# Patient Record
Sex: Male | Born: 1940
Health system: Southern US, Community
[De-identification: ages and names within clinical notes are randomized; demographics above are authoritative.]

## PROBLEM LIST (undated history)

## (undated) DIAGNOSIS — I10 Essential (primary) hypertension: Secondary | ICD-10-CM

## (undated) DIAGNOSIS — N189 Chronic kidney disease, unspecified: Secondary | ICD-10-CM

## (undated) DIAGNOSIS — E785 Hyperlipidemia, unspecified: Secondary | ICD-10-CM

## (undated) DIAGNOSIS — I219 Acute myocardial infarction, unspecified: Secondary | ICD-10-CM

## (undated) DIAGNOSIS — G4733 Obstructive sleep apnea (adult) (pediatric): Secondary | ICD-10-CM

## (undated) DIAGNOSIS — I2510 Atherosclerotic heart disease of native coronary artery without angina pectoris: Secondary | ICD-10-CM

## (undated) DIAGNOSIS — K219 Gastro-esophageal reflux disease without esophagitis: Secondary | ICD-10-CM

## (undated) DIAGNOSIS — H9190 Unspecified hearing loss, unspecified ear: Secondary | ICD-10-CM

## (undated) DIAGNOSIS — M1990 Unspecified osteoarthritis, unspecified site: Secondary | ICD-10-CM

## (undated) DIAGNOSIS — Z972 Presence of dental prosthetic device (complete) (partial): Secondary | ICD-10-CM

## (undated) DIAGNOSIS — E119 Type 2 diabetes mellitus without complications: Secondary | ICD-10-CM

## (undated) HISTORY — DX: Essential (primary) hypertension: I10

## (undated) HISTORY — DX: Acute myocardial infarction, unspecified: I21.9

## (undated) HISTORY — DX: Obstructive sleep apnea (adult) (pediatric): G47.33

## (undated) HISTORY — DX: Hyperlipidemia, unspecified: E78.5

## (undated) HISTORY — DX: Atherosclerotic heart disease of native coronary artery without angina pectoris: I25.10

---

## 1985-10-04 HISTORY — PX: EYE SURGERY: SHX253

## 2005-10-04 DIAGNOSIS — I219 Acute myocardial infarction, unspecified: Secondary | ICD-10-CM

## 2005-10-04 HISTORY — DX: Acute myocardial infarction, unspecified: I21.9

## 2006-04-27 ENCOUNTER — Inpatient Hospital Stay (HOSPITAL_COMMUNITY): Admission: AD | Admit: 2006-04-27 | Discharge: 2006-05-05 | Payer: Self-pay | Admitting: Cardiovascular Disease

## 2006-04-27 HISTORY — PX: CARDIAC CATHETERIZATION: SHX172

## 2006-04-28 ENCOUNTER — Encounter: Payer: Self-pay | Admitting: Vascular Surgery

## 2006-04-29 HISTORY — PX: TEE WITHOUT CARDIOVERSION: SHX5443

## 2006-04-29 HISTORY — PX: CORONARY ARTERY BYPASS GRAFT: SHX141

## 2006-05-19 ENCOUNTER — Emergency Department (HOSPITAL_COMMUNITY): Admission: EM | Admit: 2006-05-19 | Discharge: 2006-05-20 | Payer: Self-pay | Admitting: Emergency Medicine

## 2006-05-30 ENCOUNTER — Encounter
Admission: RE | Admit: 2006-05-30 | Discharge: 2006-05-30 | Payer: Self-pay | Admitting: Thoracic Surgery (Cardiothoracic Vascular Surgery)

## 2007-03-25 IMAGING — CR DG CHEST 2V
2 series · 2 of 2 positions shown · non-contrast
Comparison: 05/03/2006.

CLINICAL DATA: Status post CABG two weeks ago, now with right-sided chest pain on inspiration.      
 CHEST - 2 VIEW:

[w chest pa]
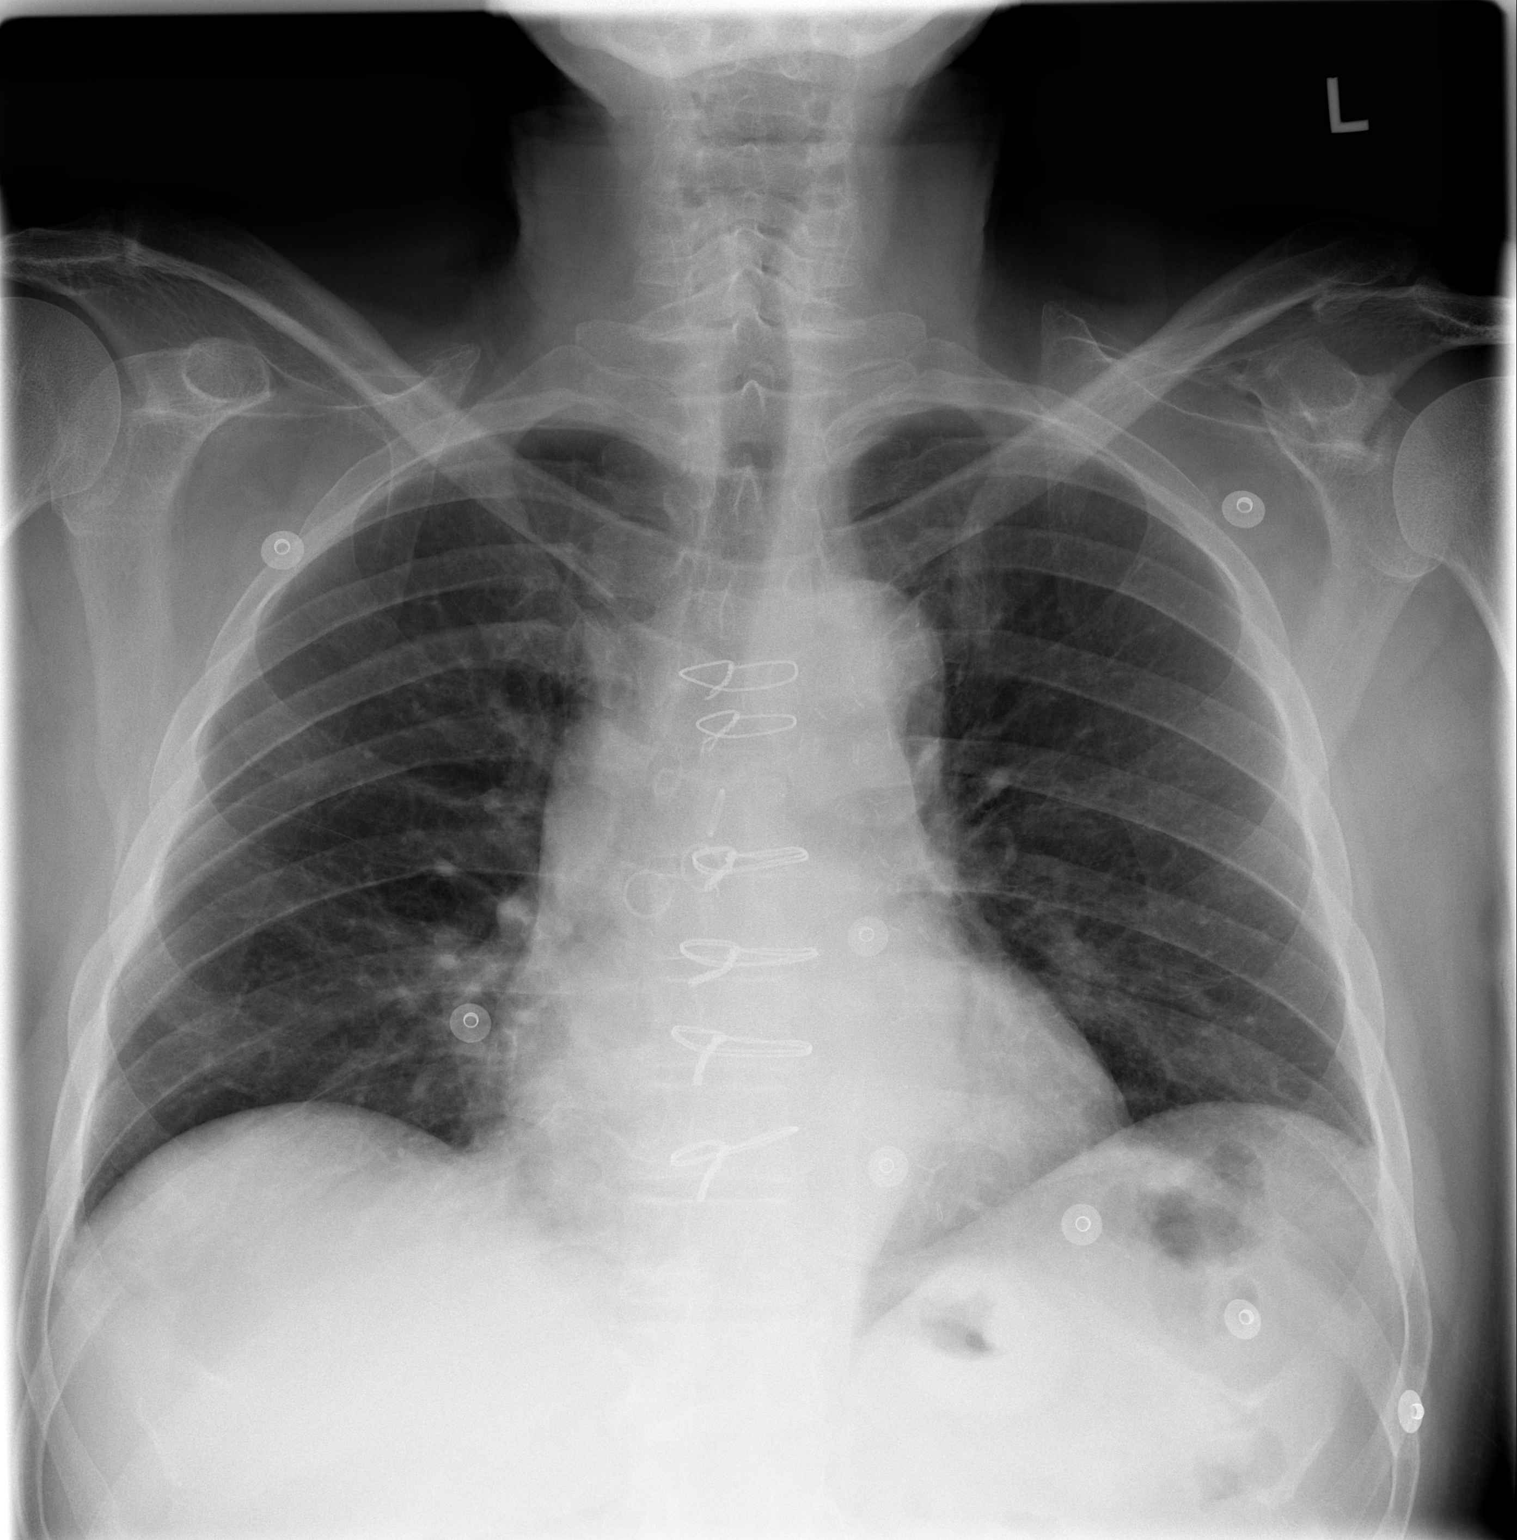

[w chest lat]
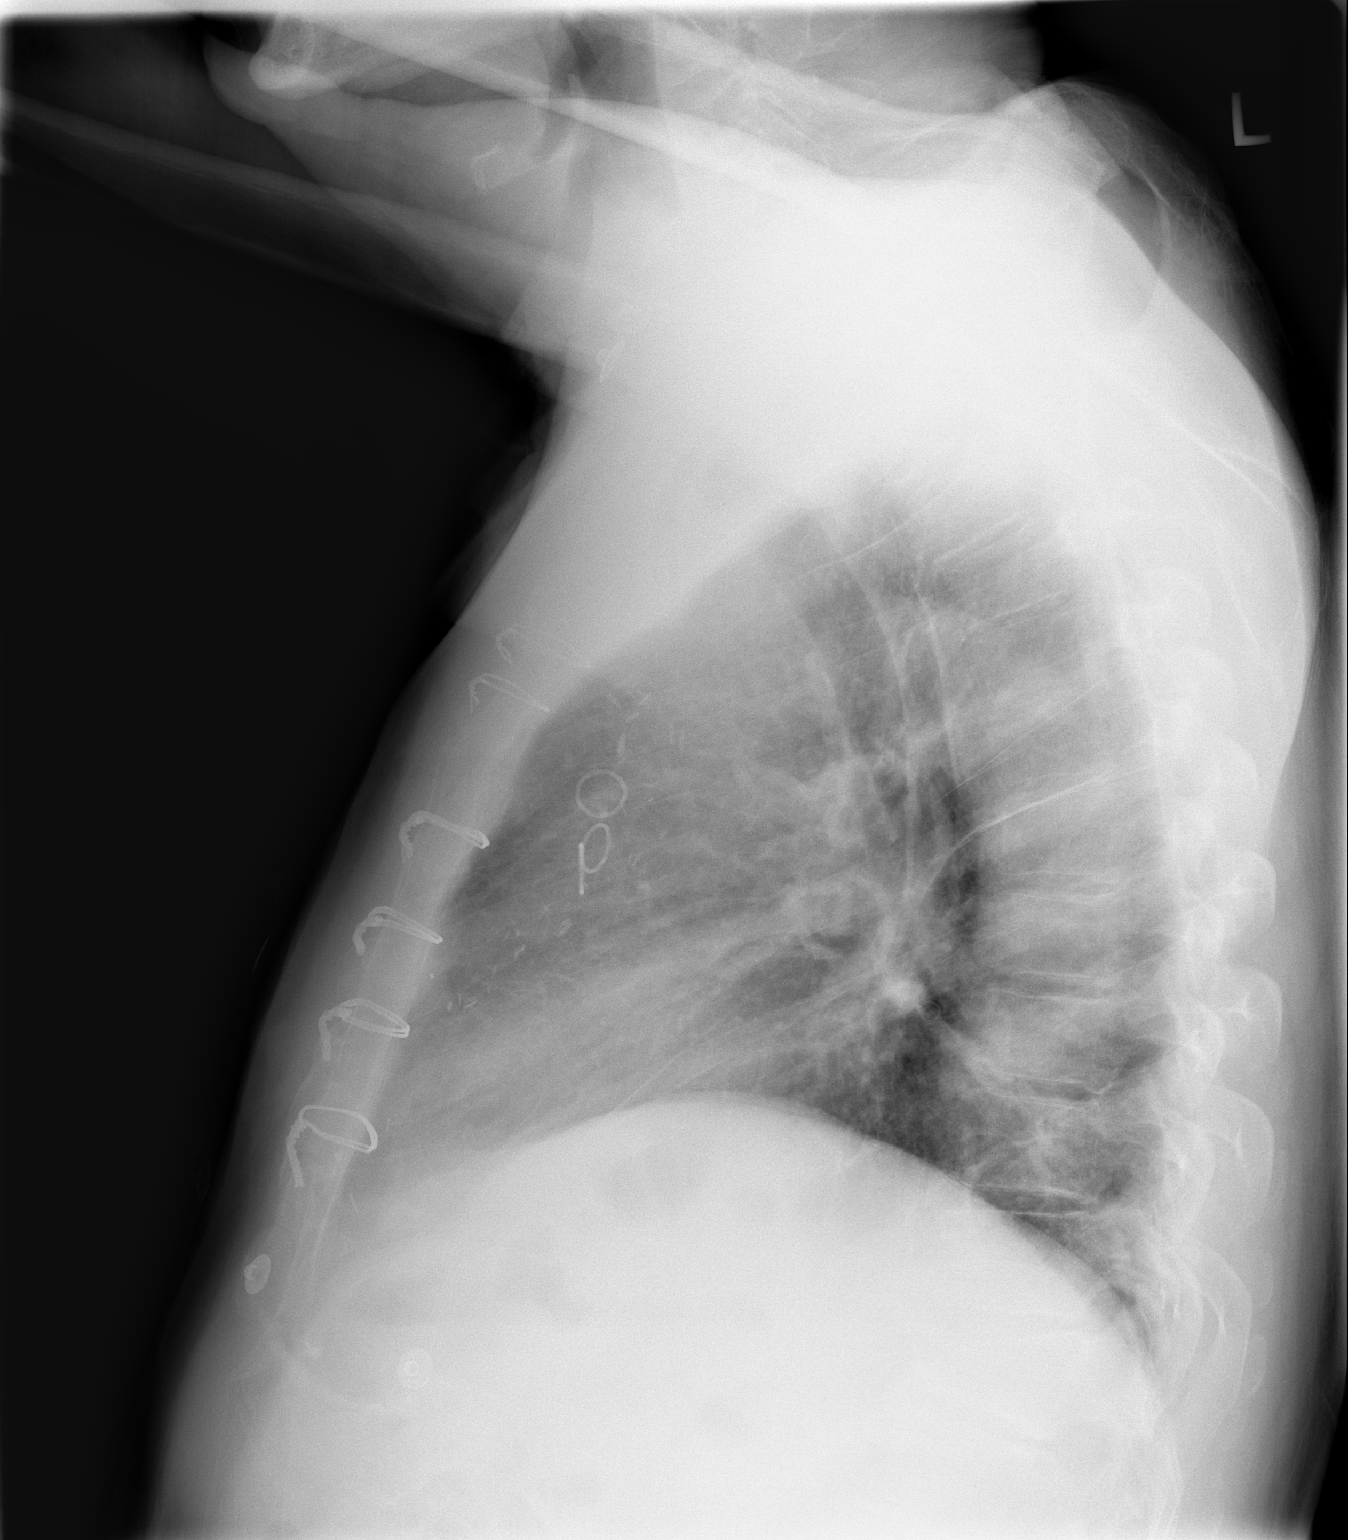

[2 of 2 positions shown; findings below may reference images not displayed]

FINDINGS: The patient is status post CABG.  Mild cardiomegaly persists.  There is improved aeration of both lower lobes with only trace left effusion evident.  Lower thoracic vertebral compression fracture again noted.
IMPRESSION: 1.  Improved aeration with only minimal residual left effusion present. 
 2.  Lower thoracic spine compression fracture again noted.

## 2008-03-26 ENCOUNTER — Ambulatory Visit (HOSPITAL_COMMUNITY): Admission: RE | Admit: 2008-03-26 | Discharge: 2008-03-26 | Payer: Self-pay | Admitting: Cardiovascular Disease

## 2008-03-26 HISTORY — PX: CARDIAC CATHETERIZATION: SHX172

## 2008-04-01 ENCOUNTER — Inpatient Hospital Stay (HOSPITAL_COMMUNITY): Admission: RE | Admit: 2008-04-01 | Discharge: 2008-04-02 | Payer: Self-pay | Admitting: Cardiovascular Disease

## 2008-04-01 HISTORY — PX: CARDIAC CATHETERIZATION: SHX172

## 2008-04-17 ENCOUNTER — Encounter: Payer: Self-pay | Admitting: Cardiovascular Disease

## 2008-05-04 ENCOUNTER — Encounter: Payer: Self-pay | Admitting: Cardiovascular Disease

## 2008-06-04 ENCOUNTER — Encounter: Payer: Self-pay | Admitting: Cardiovascular Disease

## 2008-07-04 ENCOUNTER — Encounter: Payer: Self-pay | Admitting: Cardiovascular Disease

## 2008-10-04 HISTORY — PX: CORONARY ANGIOPLASTY WITH STENT PLACEMENT: SHX49

## 2010-07-03 ENCOUNTER — Ambulatory Visit: Payer: Self-pay | Admitting: Ophthalmology

## 2010-07-07 ENCOUNTER — Ambulatory Visit: Payer: Self-pay | Admitting: Ophthalmology

## 2011-02-16 NOTE — Cardiovascular Report (Signed)
NAME:  Daniel Mays, Daniel Mays NO.:  [id number]   MEDICAL RECORD NO.:  [id number]          PATIENT TYPE:  OIB   LOCATION:  2899                         FACILITY:  [facility]   PHYSICIAN:  Nicki Guadalajara, M.D.     DATE OF BIRTH:  1941/03/14   DATE OF PROCEDURE:  03/26/2008  DATE OF DISCHARGE:  03/26/2008                            CARDIAC CATHETERIZATION   INDICATIONS:  Daniel Mays is a 70 year old gentleman who suffered an  acute and high risk inferior wall ST-segment elevation myocardial  infarction in July 2007.  Emergent catheterization was performed and he  had recurrent episodes of ventricular fibrillation requiring multiple  defibrillations, CPR, insertion of intra-aortic balloon pump and  pacemaker.  He underwent successful percutaneous coronary intervention  to 5 lesions in a totally occluded right coronary artery with  restoration of TIMI 3 flow.  Due to severe concomitant CAD, stenting was  not done since he required bypass surgery.  The following day, he  underwent CABG x5 by Dr. Dorris Fetch with a LIMA to the LAD, a vein to  the diagonal, a vein to the intermediate vessel, and a vein sequentially  to the PDA and PLA branches of his right coronary artery.  In September  2005, a subsequent MyoScan showed almost complete salvage of myocardium  with only minimal residual thinning inferolaterally with a post-stress  ejection fraction of 56%.  Recently, he underwent a thallium scan on  March 08, 2008, which was high risk and now showed a large defect in the  inferior wall with ischemia present as well as ischemia in the  inferolateral and apical territory.  He is now referred for definitive  cardiac catheterization.   PROCEDURE:  After premedication with Versed 2 mg intravenously, the  patient was prepped and draped in usual fashion.  His right femoral  artery was punctured anteriorly and a 5-French sheath was inserted.  Diagnostic catheterization was done utilizing  5-French Judkins for left  and right coronary catheters.  The right catheter was also used for  selective angiography into the 3 vein grafts which arose from the aorta.  A LIMA catheter was used for selective angiography into the left  internal mammary artery.  A 5-French pigtail catheter was used for  biplane selective ventriculography as well as distal aortography.  Hemostasis was obtained by direct manual pressure.  The patient  tolerated the procedure well.   HEMODYNAMIC DATA:  Central aortic pressure was 150/72.  Left ventricular  pressure was 150/5.  Post A-wave 18.   ANGIOGRAPHIC DATA:  Left main coronary artery trifurcated into an LAD  and intermediate vessel and left circumflex system.   The ostium of the LAD had a 95% stenosis.  This vessel seemed to supply  a proximal diagonal or septal perforating type vessel and then was  occluded.   The ramus intermediate vessel had 80%-90% proximal stenosis and then  gave rise to a moderate-sized branch which seemed to collateralize the  distal RCA and then was occluded.   The AV groove circumflex had 30%-40% focal narrowing and gave rise to  the distal marginal vessel.   The right  coronary artery had diffuse irregularity proximally of 50%.  There did appear to be a healed dissection diffusely in the mid RCA with  narrowing with visualization of both lumens and narrowing of 60%.  There  was 70% narrowing in the region of the crux diffusely.  A flush and fill  phenomenon was seen in the PDA vessel, most likely due to left-to-right  collaterals.   The vein graft supplying the right coronary artery was totally occluded  at its origin.  Next, a vein graft supplying the ramus intermediate  vessel was totally occluded at its origin.  Next, the vein graft  supplying the diagonal vessel was patent and this seemed to supply  moderate-sized diagonal vessel in the mid LAD and there was filling of  the LAD proximally with narrowing of  approximately 60% between the more  proximal diagonal vessel.  There was also 40%-50% narrowing in the LAD  after the diagonal which the graft anastomosed into arose.  The LAD was  visualized from this diagonal injection and otherwise was free of  significant disease and extended and wrapped around portion of the LV  apex.   The LIMA graft to the LAD was small-caliber and atretic and the LAD was  not visualized from this atretic LIMA graft.   Biplane selective ventriculography revealed preserved contractility with  low normal ejection fraction with mild residual hypocontractility in the  anterolateral wall but preserved contractility inferiorly.  In the LAO  projection, contractility appeared fairly normal with an ejection  fraction of approximately 50%-55%.   Distal aortography demonstrated 80% left renal artery stenosis focally.   IMPRESSION:  1. Low normal ejection fraction of 50%-55% with mild hypocontractility      anterolaterally in this patient status post previous large inferior      wall myocardial infarction with ventricular fibrillation arrest in      2007.  2. A 95% ostial left anterior descending stenosis with total occlusion      of the proximal left anterior descending beyond a large branch.  3. A 80% proximal stenosis in the ramus intermediate vessel with total      occlusion of the mid intermediate vessel.  4. A 30%-40% narrowing in the atrioventricular groove circumflex.  5. Diffusely diseased right coronary artery with diffuse narrowings of      50%, 60%, and 70% with evidence for healed probable old dissection      in the mid right coronary artery with 60%-70% narrowing and diffuse      70% narrowing distally with competitive filling of the distal right      coronary artery via left-to-right collaterals.  6. Occluded vein graft to the right coronary artery.  7. Occluded vein graft to the ramus intermediate vessel.  8. Patent vein graft to a diagonal vessel with  filling of the left      anterior descending system via this diagonal graft.  9. Atretic left internal mammary artery graft to the left anterior      descending.  10.A 80% focal left renal artery stenosis.   DISCUSSION:  Daniel Mays is status post acute coronary syndrome/acute  ST-segment MI on April 27, 2006.  He subsequently had VF arrest requiring  numerous defibrillations, CPR, intra-aortic balloon pump, and pacemaker  insertion.  He has successful 5 sites intervention with PTCA to his  right coronary artery and due to high-grade concomitant CAD, he  underwent elective CABG revascularization surgery the following day.  He  has been demonstrated to  have significant salvage of myocardium from his  prior event.  He now has been demonstrated to have significant ischemia,  most likely in the RCA territory on his recent nuclear scan.  His RCA is  diffusely diseased which is not amendable to intervention and he does  have an occluded graft.  He has left-to-right collaterals supplying the  distal RCA.  His ostial LAD is now 95% stenosed which jeopardizes a  large branch proximally and in addition his ramus intermediate vessel is  80%-90% stenosed which also jeopardizes collateralization to his RCA.  The patient does have renal insufficiency.  He will be hydrated.  Increased medication will be recommended.  He will be brought back to  the laboratory next week for attempted intervention to his native LAD  and ramus  intermediate vessel ostially and proximally with increased medical  therapy for his RCA territory.  In addition, ultimately he may require  intervention to his left renal artery, but this will be further  evaluated following coronary stabilization.           ______________________________  Nicki Guadalajara, M.D.     TK/MEDQ  D:  03/26/2008  T:  03/27/2008  Job:  [id number]   cc:   Lourena Simmonds, M.D.

## 2011-02-16 NOTE — Discharge Summary (Signed)
NAME:  Daniel Mays, Daniel Mays NO.:  [id number]   MEDICAL RECORD NO.:  [id number]          PATIENT TYPE:  OIB   LOCATION:  6522                         FACILITY:  [facility]   PHYSICIAN:  Darcella Gasman. In[place], N.P.  DATE OF BIRTH:  1940/11/11   DATE OF ADMISSION:  04/01/2008  DATE OF DISCHARGE:  04/02/2008                               DISCHARGE SUMMARY   DISCHARGE DIAGNOSES:  1. Abnormal stress test.  2. Coronary disease evaluated with outpatient cath March 26, 2008,      found to have graft dysfunction from his bypass grafting done in      2007.  3. Coronary disease with percutaneous transluminal coronary      angioplasty and stent deployment with PROMUS stent to the left      anterior descending and the ramus intermedius.  4. Continued medical therapy for right coronary artery stenosis.  5. Peripheral vascular disease with 80% left renal artery stenosis.  6. Left ventricular function ejection fraction was 50-55% with mild      hypercontractility anterolaterally.  7. Dyslipidemia.  8. Hypertension.   DISCHARGE CONDITION:  Improved.   PROCEDURES:  April 01, 2008, percutaneous transluminal coronary  angioplasty and stent deployment to the left anterior descending and  intermedius ramus by Dr. Nicki Guadalajara with drug-eluting PROMUS stent.   DISCHARGE MEDICATIONS:  1. Metoprolol 50 mg half a tablet daily.  2. Vytorin 10/40 daily.  3. Ramipril 10 mg daily.  4. Aspirin 325 mg daily.  5. Tylenol 500 mg every morning.  6. Zantac 150 mg twice a day.  7. Plavix 75 mg daily, do not stop.  8. Imdur 60 mg daily.  9. Norvasc 5 mg daily.  10.Nitroglycerin 150 sublingual as needed for chest pain every 5      minutes up to 3/15.   DISCHARGE INSTRUCTIONS:  1. Low-sodium heart-healthy diet.  2. Wash cath site with soap and water.  Call us if any bleeding,      swelling, or any drainage.  3. Increase activity slowly.  May shower.  No lifting for 2 days.  No      driving for 2 days.  4.  Follow up with Dr. Tresa Endo April 24, 2008, at 2:45 p.m. in [place].  5. We will arrange for phase II cardiac rehab.   HISTORY OF PRESENT ILLNESS:  A 70 year old male patient with complex  cardiac history including an acute inferior MI in July 2007, including V-  fib arrest with multiple defibrillations, CPR, intraaortic balloon pump,  pacemaker.  He had interventions to 5 lesions in the RCA, but due to  severe coronary disease, he went on to have bypass grafting times5 by  Dr. Dorris Fetch with LIMA to the LAD, saphenous vein graft to the first  diagonal, vein graft to the ramus intermedius, vein graft to the PDA and  PLA branches of the RCA.  EF was improved by his nuclear study.  He he  had a nuclear study done March 08, 2008, for follow-up which revealed a  high risk and was a large defect in the inferior wall with ischemia  present.  EF  was 51%.  He also had EKG changes during the test.  He had  no chest pain, but he never he exerted himself either.  The patient was  brought in for combined left heart cath with graft visualization March 26, 2008, and was found to have graft dysfunction with vein graft to the  RCA, PLA, PDA occluded, vein graft to the ramus was occluded, LIMA to  the LAD was atretic and the vein graft to the diagonal was patent.  Due  to the significance, it was felt he should undergo intervention to the  LAD and intermediate ramus.  His RCA will be treated medically.   The patient was also found to have an 80% left renal artery stenosis at  that time and will have plans for evaluation once his cardiac issues are  stable.   Other history includes renal insufficiency, hypertension, dyslipidemia.   HOSPITAL COURSE:  The patient was brought in April 01, 2008, underwent  angioplasties and drug-eluting stents to the LAD and the PROMUS.  He was  given fluids prior to cath and after cath, did well.  By the morning of  April 02, 2008, he was stable and ready for discharge home.   He is being  treated medically for his RCA lesion with Nitro-Dur, and Norvasc.  He  would continue his beta blocker and statin.  Dr. Tresa Endo mentions he may  want to have Addrenex as an outpatient as he has any complications.   The patient ambulated with rehab without complaints and was ready for  discharge home.  Please note for his family history, social history,  review of systems see H&P.   PHYSICAL EXAMINATION ON DISCHARGE:  Blood pressure was 141/60, pulse 48,  respiration 16, temperature 97.3, sats on room air were 96%.   LAB VALUES:  Hemoglobin 12.4, hematocrit 35.1, platelets 209, and WBC  6.4, CK-MB, initially CK 266, MB 3.7, troponin I 0.11.  Follow-up CK  214, MB 3.1 and follow-up troponin was 0.12.  Follow-up chemistry,  sodium 139, potassium 3.9, chloride 108, CO2 24, BUN 17, creatinine 143,  glucose 100, calcium 8.3.   The patient was seen and discharged by Dr. Tresa Endo on April 02, 2008.      Darcella Gasman. Annie Paras, N.P.     LRI/MEDQ  D:  04/02/2008  T:  04/03/2008  Job:  [id number]   cc:   Nicki Guadalajara, M.D.  [place] Sender

## 2011-02-16 NOTE — Cardiovascular Report (Signed)
NAME:  Daniel Mays NO.:  [id number]   MEDICAL RECORD NO.:  [id number]          PATIENT TYPE:  OIB   LOCATION:  6522                         FACILITY:  [facility]   PHYSICIAN:  Nicki Guadalajara, M.D.     DATE OF BIRTH:  Feb 18, 1941   DATE OF PROCEDURE:  04/01/2008  DATE OF DISCHARGE:                            CARDIAC CATHETERIZATION   PROCEDURE:  Percutaneous coronary intervention with cutting balloon  arthrotomy/stenting of the ostium of the left anterior descending artery  and the proximal ramus intermediate coronary arteries.   INDICATIONS:  Daniel Mays is a 70 year old gentleman who  suffered an acute high risk inferior wall myocardial infarction in July  2007.  At that time, he had recurrent episodes of ventricular  fibrillation requiring multiple defibrillations, CPR as well as  insertion of aortic balloon pump, and pacemaker; and he underwent  successful intervention opening up 5 lesions in a totally occluded right  coronary artery.  He had restoration of TIMI 3 flow but had severe  concomitant CAD and consequently stenting was not done due to his need  for bypass surgery.  Following day, he underwent CABG surgery x5 by Dr.  Dorris Fetch with a LIMA to the LAD, a vein to the diagonal, a vein to  the intermediate, and sequential vein to the PDA and PLA branches of the  right coronary artery.  In September 2005, a Myoview study showed almost  complete salvage of myocardium with only minimal residual thinning  inferolaterally with a post-stress ejection fraction of 56%.  He  recently underwent a followup nuclear stress test in June 2009 which  suggested a defect in the inferior wall with ischemia as well as  inferolaterally and apically.  For this reason, he underwent diagnostic  catheterization on March 26, 2008.  This showed a low normal ejection  fraction with an EF of 50-55% with mild hypocontractility  anterolaterally.  He had a 95% ostial LAD  stenosis with total occlusion  of the proximal LAD beyond a large branch vessel.  There was an 85%  proximal stenosis diffusely in the ramus intermediate vessel with total  occlusion of the mid intermediate vessel.  A small branch also seemed to  rise proximally which was diminutive and 95% stenosis.  He had 30-40%  narrowing in the AV groove circumflex.  His native right coronary artery  was diffusely diseased with evidence for healed old dissection in the  middle RCA with diffuse 50%, 60%, and 70% stenoses; and there was  competitive filling in the distal RCA via left-to-right collaterals.  He  had an occluded vein graft to the RCA, an occluded vein graft to the  ramus intermediate vessel, a patent vein graft to diagonal vessel with  filling of the LAD, system via this diagonal graft which ultimately led  to atresia of the LIMA graft which had supplied the LAD.  He was also  incidentally noted to have an 80% focal left renal artery stenosis.  The  patient was hydrated, sent home, started on Plavix therapy.  He is now  brought back to the hospital today to attempt 2-vessel  intervention to  his native LAD and intermediate system.   PROCEDURE NOTE:  After premedication with Valium 3 mg intravenously, the  patient was prepped and draped in usual fashion.  His right femoral  artery was punctured anteriorly and a 6-French sheath was inserted  without difficulty.  The patient was given Integrilin for  anticoagulation in addition to 5000 units of heparin.  ACT was  documented to be therapeutic.  The patient had been on Plavix therapy.  An FL4 6-French guide was used for the intervention.  A Prowater wire  was advanced down into the LAD beyond the 95% ostial stenosis.  This was  very focal lesion and due to its ostial nature, a 2.5 x 6-mm cutting  balloon was first used for predilatation to hopefully reduce potential  for plaque shifting.  Several cuts and dilatations were made with the   cutting balloon and ultimately a 2.5 x 8-mm Promus drug-eluting stent  was then successfully deployed x2 up to 14 atmospheres.  Post-stent  dilatation was done utilizing a 2.75 x 6-mm Voyager Valier balloon up to 13  atmospheres.  Attention was then directed at the ramus intermediate  vessel.  Initially, there was some difficulty in the wire cannulating  this vessel.  It seemed to always get into the very small branch which  had high-grade lesion.  This small branch was of very small caliber and  was not felt to be suitable for intervention.  The ramus vessel was then  able to be successfully cannulated with the same Prowater wire.  The  same cutting balloon 2.75 x 6 mm was used for predilatation.  A 2.5 x 12-  mm Promus drug-eluting stent was then successfully deployed and dilated  x2 up to 14 atmospheres.  A 2.75 x 6-mm Voyager Orason was used for post-  stent dilatation up to 2.75 mm.  [facility] angiography confirmed an  excellent angiographic result.  During the procedure, ACT was documented  to be therapeutic.  At the end of the procedure, he did receive an  additional 150 mg of Plavix just after leaving the catheterization  laboratory in the holding area.   HEMODYNAMIC DATA:  Central aortic pressure is 140/67.   During the procedure, the patient did receive numerous doses of  intracoronary nitroglycerin.   ANGIOGRAPHIC DATA:  Please refer to the cardiac catheterization report  of March 26, 2008, for details of his cardiac catheterization study.  Angiography at the start of the interventional procedure, again showed a  95% ostial LAD stenosis beyond the left main and intermediate vessel.  There was 85% diffuse stenosis in the intermediate vessel and 30%  narrowing in the mid AV groove circumflex.   Following successful cutting balloon arthrotomy, insertion of a 2.5 x 6-  mm Promus drug-eluting stent with post-stent dilatation up to 2.75 mm,  the 95% ostial stenosis was reduced to 0%.  There  was brisk TIMI 3 flow.  No evidence for dissection.   Following successful cutting balloon arthrotomy of the intermediate  vessel, with ultimate insertion of a 2.5 x 12-mm Promus drug-eluting  stent with poststent dilatation up to 2.75 mm, diffuse 85% proximal  intermediate stenosis was reduced to 0%.  There was brisk TIMI 3 flow.  No evidence for dissection.   IMPRESSION:  Successful 2-vessel percutaneous coronary intervention  involving the left anterior descending ostium and very proximal ramus  intermediate vessel with a 95% left anterior descending artery stenosis  being reduced to 0% with cutting  balloon arthrotomy/insertion of a 2.5 x  8-mm Promus DES stent postdilated at 2.75 mm, and cutting balloon  arthrotomy, insertion of a 2.75 x 12-mm Promus drug-eluting stent  postdilated 2.75 mm in the intermediate vessel with the 95% stenosis  being reduced to 0% in the left anterior descending and 85% stenosis  being reduced to 0% in the intermediate vessel done with a double-bolus  Integrilin/weight-adjusted heparin in this patient on aspirin/Plavix  therapy.           ______________________________  Nicki Guadalajara, M.D.     TK/MEDQ  D:  04/01/2008  T:  04/02/2008  Job:  [id number]   cc:   Vista Mink, NP  Salvatore Decent. Dorris Fetch, M.D.  [place] Sender

## 2011-02-19 NOTE — Op Note (Signed)
Daniel Mays, Daniel Mays               ACCOUNT NO.:  [id number]   MEDICAL RECORD NO.:  [id number]          PATIENT TYPE:  INP   LOCATION:  2312                         FACILITY:  [facility]   PHYSICIAN:  Salvatore Decent. Hendrickson, M.D.DATE OF BIRTH:  01/29/1941   DATE OF PROCEDURE:  DATE OF DISCHARGE:                                 OPERATIVE REPORT   PREOPERATIVE DIAGNOSIS:  Severe three-vessel coronary disease, status post  myocardial infarction.   POSTOPERATIVE DIAGNOSIS:  Severe three-vessel coronary disease, status post  myocardial infarction.   PROCEDURE:  1.  Median sternotomy, extracorporeal circulation, coronary artery bypass      grafting x5 (left internal mammary artery to left anterior descending,      saphenous vein graft to first diagonal, saphenous vein graft to ramus      intermedius, sequential saphenous vein graft to posterior descending and      posterolateral).  2.  Endoscopic vein harvest, right leg.   SURGEON:  Salvatore Decent. Dorris Fetch, M.D.   ASSISTANT:  Pecola Leisure, P.A.   ANESTHESIA:  General.   FINDINGS:  Good-quality targets.  Good-quality conduits.  Transesophageal  echocardiography revealed inferior hypokinesis, but otherwise preserved left  ventricular function, no significant mitral regurgitation.   CLINICAL NOTE:  Daniel Mays is a 70 year old gentleman who presented with  an acute myocardial infarction.  He had a V-fib arrest from which he was  resuscitated.  Dr. Tresa Endo did an emergency angioplasty of the right coronary  artery, which was totally occluded, reestablishing flow.  The patient had  residual significant disease in the right coronary system as well as in the  LAD, diagonal and ramus intermedius.  He was referred for coronary artery  bypass grafting.  The indications, risks, benefits and alternatives were  discussed in detail with the patient.  He understood and accepted the risks  and agreed to proceed.   OPERATIVE NOTE:  Daniel Mays was  brought to the preop holding area on April 29, 2006.  There, lines were placed to monitor arterial,  central venous and  pulmonary arterial pressure.  ECG leads were placed for continuous  telemetry.  Intravenous antibiotics were administered.  He was taken to the  operating room, anesthetized and intubated.  Dr. Sheldon Silvan of the  anesthesia service performed transesophageal echocardiography, which  revealed inferior hypokinesis, but otherwise preserved left ventricular  function.  There was no significant mitral regurgitation or other valvular  pathology.  The patient was prepped and draped in the usual sterile fashion.  A median sternotomy was performed and the left internal mammary artery was  harvested using standard technique.  Simultaneously, an incision was made in  the medial aspect of the right leg at the level of the knee and the greater  saphenous vein was harvested from the groin to the mid-calf.  The saphenous  vein was of good quality, as was the mammary artery.  Five thousand units of  heparin were administered during the vessel harvest; the remainder of the  full heparin dose was given prior to opening the pericardium.   The pericardium was opened.  The  ascending aorta was inspected and palpated.  There was no atherosclerotic disease.  The aorta was cannulated via  concentric 2 Ethibond pledgeted pursestring sutures.  A dual-stage venous  cannula was placed via pursestring suture in the right atrial appendage.  After confirming adequate anticoagulation with ACT measurement,  cardiopulmonary bypass was instituted and the patient was cooled to 32  degrees Celsius.  The coronary arteries were inspected and anastomotic sites  were chosen.  The conduits were inspected and cut to length.  A foam pad was  placed in the pericardium to protect the left phrenic nerve.  A temperature  probe was placed in the myocardial septum and a cardioplegia cannula was  placed in the ascending  aorta.   The aorta was crossclamped.  The left ventricle was emptied via the aortic  root vent.  Cardiac arrest then was achieved with a combination of cold  antegrade blood cardioplegia and topical iced saline.  After achieving a  complete diastolic arrest and adequate myocardial septal cooling, the  following distal anastomoses were performed.   First, a reversed saphenous vein graft was placed sequentially to the  posterior descending and posterolateral branches of the right coronary.  Both of these were good-quality targets.  A side-to-side anastomosis was  performed to the posterior descending and end-to-side to the posterolateral.  All anastomoses were probed proximally and distally at their completion and  at the completion of each vein graft, cardioplegia was administered to  assess flow and hemostasis.   Next, a reversed saphenous vein graft was placed end-to-side to the ramus  intermedius.  This was a 1.5-mm good-quality target.  The vein graft also  was of good quality.   A saphenous vein graft was anastomosed end-to-side to the first diagonal.  The first diagonal was a smaller vessel which bifurcated just beyond the  anastomosis.  The vein graft of this vessel was smaller in caliber, but  still satisfactory in quality.   Next, the left internal mammary artery was brought through a window in the  pericardium and the distal end was spatulated and was anastomosed end-to-  side to the distal LAD.  The LAD was a 1.5-mm target.  The mammary was a 2.5-  mm conduit.  The anastomosis was performed end-to-side with a running 7-0  Prolene suture.  At completion of the mammary-to-LAD anastomosis, the  bulldog clamp was briefly removed to inspect for hemostasis.  Immediate and  rapid septal rewarming was noted.  The mammary pedicle was tacked to the  epicardial surface of the heart.  Additional cardioplegia was administered down the vein grafts.  They were  cut to length.  The  cardioplegic cannula was removed from the ascending  aorta and the proximal vein graft anastomoses were performed to 4.5-mm punch  aortotomies with running 6-0 Prolene sutures.  Rewarming was performed  during the proximal anastomoses.   At the completion of the final proximal anastomoses, the patient is placed  in steep Trendelenburg position.  The aortic root was de-aired and the  aortic crossclamp was removed.  The total crossclamp time was 71 minutes.  The patient did not fibrillate, but did have complete heart block initially.   All proximal and distal anastomoses were inspect for hemostasis.  Epicardial  pacing wires were placed on the right ventricle and right atrium and DDD  pacing was initiated.  A low-dose dopamine infusion was initiated and the  patient was weaned without difficulty from cardiopulmonary bypass on the  first attempt.  Postbypass transesophageal echocardiography again revealed  preserved left ventricular function with the exception of inferior  hypokinesis and there was no significant valvular pathology.  The initial  cardiac index was greater than 2 L per minute per meter squared and the  patient remained hemodynamically stable throughout post-bypass period.   A test dose of protamine was administered and was well tolerated.  The  atrial and aortic cannulae were removed.  The remainder of the protamine was  administered without incident.  The chest was irrigated with 1 L of warm  normal saline containing 1 g of vancomycin.  Hemostasis was achieved.  A  left pleural and 2 mediastinal chest tubes were placed through separate  subcostal incisions.  The pericardium could not be reapproximated, but the  mediastinal and pleural fat was reapproximated over the base of the heart  and the vein graft to the right coronary system.  The sternum was closed  with a combination of single and double heavy-gauge stainless steel wires.  The remainder of  the incision was closed  in standard fashion.  Subcuticular closures were  used for the skin.  All sponge, needle and sponge counts were correct at the  end of the procedure.  The patient was taken from the operating room to the  surgical intensive care unit in critical, but stable condition.           ______________________________  Salvatore Decent Dorris Fetch, M.D.     SCH/MEDQ  D:  04/29/2006  T:  04/30/2006  Job:  [id number]   cc:   Nicki Guadalajara, M.D.  Fax: [phone]
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
## 2011-02-19 NOTE — Cardiovascular Report (Signed)
[name], [name]               ACCOUNT NO.:  [id number]   MEDICAL RECORD NO.:  [id number]          PATIENT TYPE:  INP   LOCATION:  2914                         FACILITY:  [facility]   PHYSICIAN:  Daniel Mays, M.D.     DATE OF BIRTH:  11-07-1940   DATE OF PROCEDURE:  04/27/2006  DATE OF DISCHARGE:                              CARDIAC CATHETERIZATION   PROCEDURE:  Emergency cardiac catheterization secondary to ST-segment  elevation MI:  [facility] coronary angiography; recurrent VF requiring multiple  defibrillations, CPR, intra-aortic balloon pump, temporary pacemaker,  percutaneous coronary intervention with five lesion PTCA of the right  coronary artery; left ventriculography; Swan-Ganz catheterization to unit.   INDICATIONS:  Daniel Mays is a 70 year old gentleman who is followed  primarily by Dr. Loma Mays.  He has a history of hypertension.  He  works as a Curator.  Apparently, this afternoon he developed acute onset of  severe chest pain.  He ultimately was transported to [facility] emergency  room where was found to have high risk ST-segment elevation inferior wall  myocardial infarction/injury.  He was immediately brought up to the cardiac  catheterization laboratory for acute intervention.   PROCEDURE:  Upon arrival to the catheterization laboratory, the patient was  having severe chest pain and had marked ST-segment elevation inferiorly with  profound T-wave inversion, ST-segment depression in leads I and F, V1-V3.  The right femoral artery was punctured anteriorly, and a 6-French arterial  sheath was inserted.  The right femoral vein was punctured and a 6-French  venous sheath was inserted.  Since it was felt that the patient most likely,  in light of his high risk MI, would require temporary pacemaker.  He was  also shown to have several episodes of junctional rhythm.  Temporary  pacemaker was advanced to the RV apex.  Catheterization was done utilizing 6-  French  catheters.  The patient was found to have severe high-grade multiple  lesion, multiple vessel CAD involving the left system, but angiography in  the RCA revealed total occlusion with TIMI zero flow, contributing to his  acute ST-segment elevation myocardial infarction.  It was felt ultimately  the patient would require CBG surgery electively, but would be necessary to  open up his totally occluded vessel.  He was given Integrilin, heparin and  also Alla Feeling study drug.  At this point, the pigtail catheter was inserted.  The patient then developed recurrent episodes of initially refractory VF,  requiring immediate CPR, multiple countershocks/defibrillations x three,  with ultimate restoration of rhythm.  He was given amiodarone 300 mg, as  well as Lopressor 2.5 IV due to recurrent VF.  He was also started on a  amiodarone drip.  His pacemaker rate was also adjusted.  Ultimately, his  rhythm stabilized.  An intra-aortic balloon pump was then inserted, via left  femoral artery, and one-to-one counterpulsation was obtained.  Prior to the  balloon insertion his pressure had dropped to approximately 60/40.  He also  was started on dopamine.  Following the balloon pump, initial augmentation  of blood pressures were up to 80-85 mm.  Again, dopamine was started at low  dose.  A re-look at the right coronary artery was then done to attempt  intervention.  This now showed, instead of total occlusion, it showed  evidence for reperfusion, but the RCA had multiple 99% stenosis diffusely in  the mid, mid-distal, 95% in the bifurcation of the PLA and PDA, and total  occlusion of the PDA.  It was felt most likely that, with the  anticoagulation, he developed VF of his result of reperfusion arrhythmia.  He also did have several episodes of AIVR.  An Asahi medium wire was  advanced down the RCA, after therapeutic anticoagulation was documented.  This was done via rightward side hole 6-French guiding catheter.   A 2.0 x 20-  mm Maverick balloon was inserted and multiple inflations were made at the  mid site, mid-distal site, PLA site.  At this time, a second wire was then  advanced in order to go down the PDA vessel, due to bifurcation stenosis as  well as total occlusion of the PDA.  There was initially difficulty in  crossing the mid PDA occlusion, but ultimately with the balloon as  additional support this was successful.  Multiple dilatations were made.  The balloon was then placed over the wire to the PDA vessel and multiple  dilatations were made at the ostium of the PDA and also in the mid PDA, was  which opened up the total occlusion.  The balloon was then upgraded to a 3.0  x 20-mm Maverick balloon and multiple inflations were made at these multiple  sites.  It was felt that the patient will require CBG surgery and,  therefore, a perfect result was not desired, but what was desired was a good  angiographic outcome with restoration of TIMI III flow, without stenting and  without the need for clopidogrel in light of need for surgery.  Following  multiple dilatations at these multiple sites, scout angiography confirmed  TIMI III flow of both the PDA and PLA vessel.  At this time, the catheters  were removed and a pigtail catheter was inserted.  The central aortic  pressure was now 140 systolically.  The catheter was then positioned into  the LV.  Left ventriculography was then performed in biplane views.  The  left femoral vein was then also punctured, and a sheath was inserted, and a  Swan-Ganz catheter was then inserted so that his hemodynamics can be  monitored closely in the coronary care unit.  At the completion of the  procedure, he was pain-free, talkative with stable hemodynamics.  All  sheaths were sutured in place.   HEMODYNAMIC DATA:  Initial blood pressure was 120/90, heart rate 55.  After  the patient developed his recurrent VF arrests with CPR and IV Lopressor, blood pressure  that had decreased to 90/60 and ultimately 60 systolically.  At the completion of the procedure, his central aortic pressure was 108/63.   Right atrial pressure 21, mean 18, V-wave 22, right ventricle pressure  43/11, post A-wave 20, pulmonary artery pressure 43/23, mean pulmonary  capillary wedge pressure 28 with V-wave 245.   ANGIOGRAPHIC DATA:  Left main coronary was a short vessel which trifurcated  into LAD, left circumflex and a moderate size ramus intermediate vessel.   The LAD had diffuse 70-80% proximal stenosis, followed by diffuse 80-90%,  90% proximal and 90% mid, and 70% mid-distal stenoses.   The proximal portion of the ramus intermediate vessel had diffuse 90%  stenosis extending  to the ostium.  The circumflex had 30-40% mid narrowing.  The right coronary artery had diffuse 40-50% proximal narrowings, and then  was totally occluded after a small anterior RV marginal branch.  There was  TIMI zero flow.   Following difficult successful and ultimate five lesion intervention,  initially the right coronary artery, following defibrillation, had  reperfused, most likely as a result of anticoagulation with heparin,  Integrilin and Champion study drug.  The RCA now had 90-99% mid stenosis,  99% stenosis in the region of the crux, and 95% stenosis just beyond the PDA  takeoff and a PLA vessel, 80-90% stenosis at the ostium of the PDA, and the  midportion of the PDA was totally occluded.  Following successful  intervention with PTCA, since ultimately the patient will require CABG  surgery and stenting, an optimal result was not intended, there was now  resumption of brisk TIMI III flow in a large right coronary artery with the  99% mid stenosis being reduced to 30-40% narrowing, the 99% stenosis  proximal to the crux being reduced to approximately 50%, the 90% stenosis in  the PLA being reduced to 40%, the ostium of the PDA being reduced to 40-50%,  the 100% mid PDA occlusion  being reduced to 0% and again with resumption of  TIMI III flow in a large PDA/PLA system.   Following successful percutaneous coronary intervention, with the patient  now having a blood pressure of initially 145 augmented, left  ventriculography was performed.  This showed relatively preserved global LV  contractility with an ejection fraction of 55% to at least 60% with mild  hypocontractility inferiorly, and only very minimal hypocontractility in the  low posterolateral wall on the LAO projection.  There was mild MR.   IMPRESSION:  1.  Acute inferior ST-segment elevation myocardial infarction, high risk by      ECG criteria, with marked additional ST-T changes in lead I and L and      precordially, in addition to the inferior ST-segment elevation.  2.  Severe multivessel coronary obstructive disease with 70-80% proximal,      multiple areas of 90% stenoses in the proximal, mid and 70% mid-distal      LAD stenoses; 90% ostial stenosis in the large ramus intermediate     vessel; 30-40% narrowing in the AV groove circumflex; and total      occlusion of the mid RCA with initial TIMI zero flow.  3.  Multiple VF arrests requiring CPR, multiple defibrillations, IV      amiodarone, IV Lopressor.  4.  Successful multilesion intervention, double wire technique, involving      five sites in the right coronary artery with restoration of TIMI III      flow in a large dominant RCA system as detailed above.  5.  Intra-aortic balloon pump counterpulsation for transient cardiogenic      shock.  6.  Temporary pacemaker for significant bradyarrhythmia, junctional rhythm.  7.  Swan-Ganz catheterization to unit.  8.  Alla Feeling study drug administered IV bolus, but oral Plavix not given due      to need for imminent CBG revascularization surgery for severe      concomitant multivessel CAD.           ______________________________  Daniel Mays, M.D.     TK/MEDQ  D:  04/27/2006  T:  04/27/2006   Job:  [id number]   cc:   Daniel Mays  Fax: [phone]   Daniel Mays, M.D.  Fax: [phone]
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
## 2011-02-19 NOTE — Op Note (Signed)
Daniel Mays, Daniel Mays               ACCOUNT NO.:  [id number]   MEDICAL RECORD NO.:  [id number]          PATIENT TYPE:  INP   LOCATION:  2312                         FACILITY:  [facility]   PHYSICIAN:  Sheldon Silvan, M.D.      DATE OF BIRTH:  1941/09/11   DATE OF PROCEDURE:  04/29/2006  DATE OF DISCHARGE:                                 OPERATIVE REPORT   PROCEDURE:  Intra-operative transesophageal echocardiography (TEE).   [name] was brought to the operating room today by Dr. Dorris Fetch for  coronary artery bypass grafting.  Due to his difficulty preoperatively  requiring intra-aortic balloon pulsation it was decided that he might  benefit from the use of TEE for both diagnostic and monitoring purposes  during the operation today.  I spoke to him preoperatively concerning his  esophagus and he related no history of difficulty swallowing or bleeding.   After satisfactory induction of general anesthesia, including endotracheal  intubation, the Phillips OmniPlane TEE probe was sheathed and lubricated  appropriately.  It was passed easily through the oropharynx into the  esophagus without difficulty.   The heart was imaged and the left ventricle was seen.  In the midportion of  the ventricle the walls were examined.  There was relatively good movement  in all three walls except the inferior wall  in which there was some  decreased contractility noted.   The mitral valve was examined and the leaflets moved normally.  There was no  prolapse noted.  On color flow exam of the long axis there was 1+  regurgitation noted centrally.   The aortic valve was seen and was tricuspid.  There was very little  sclerotic material noted on the cusp of the valve.  In the long axis the  valve showed no regurgitation on color flow exam.  The post-valve appearance  of the aorta was normal in size.   The interatrial septum was examined and there was no PFO or ASD noted on the  color flow.   The  tricuspid valve showed 1+ regurgitation on color flow exam but  structurally was normal.   The patient was placed on cardiopulmonary bypass by Dr. Dorris Fetch and the  operation was completed.   After weaning from the bypass machine, the heart was reexamined and the  ventricle was noted to be slightly small.  Volume was given based on  monitoring of the volume of the left ventricle using the TEE probe.  The  valves were  examined and were unchanged.  The wall motion of the left ventricle was  unchanged compared to the  exam pre-bypass.  At the conclusion of the  operation the probe was removed uneventfully and the patient was transferred  to the SICU in good condition.      Sheldon Silvan, M.D.  Electronically Signed     DC/MEDQ  D:  04/29/2006  T:  05/01/2006  Job:  [id number]
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
## 2011-07-01 LAB — CK TOTAL AND CKMB (NOT AT ARMC)
CK, MB: 3.1
CK, MB: 3.7
Total CK: 266 — ABNORMAL HIGH

## 2011-07-01 LAB — CBC
Hemoglobin: 12.4 — ABNORMAL LOW
MCHC: 34.7
MCV: 87.3
MCV: 88.7
RBC: 4.34
RDW: 13.7
WBC: 8.1

## 2011-07-01 LAB — BASIC METABOLIC PANEL
BUN: 26 — ABNORMAL HIGH
CO2: 26
Calcium: 8.3 — ABNORMAL LOW
Calcium: 9.2
Creatinine, Ser: 1.33
Creatinine, Ser: 1.5
GFR calc Af Amer: >60
GFR calc non Af Amer: 46 — ABNORMAL LOW
Glucose, Bld: 100 — ABNORMAL HIGH
Glucose, Bld: 111 — ABNORMAL HIGH
Potassium: 4.4

## 2011-07-01 LAB — TROPONIN I
Troponin I: 0.11 — ABNORMAL HIGH
Troponin I: 0.12 — ABNORMAL HIGH

## 2012-06-01 ENCOUNTER — Ambulatory Visit
Admission: RE | Admit: 2012-06-01 | Discharge: 2012-06-01 | Disposition: A | Discharge status: Home / Self Care | Payer: Medicare Other | Source: Ambulatory Visit | Attending: Cardiology | Admitting: Cardiology

## 2012-06-01 ENCOUNTER — Other Ambulatory Visit: Payer: Self-pay | Admitting: Cardiology

## 2012-06-01 DIAGNOSIS — R0602 Shortness of breath: Secondary | ICD-10-CM

## 2012-06-01 DIAGNOSIS — R05 Cough: Secondary | ICD-10-CM

## 2012-08-16 ENCOUNTER — Ambulatory Visit (INDEPENDENT_AMBULATORY_CARE_PROVIDER_SITE_OTHER): Payer: Medicare Other | Admitting: Internal Medicine

## 2012-08-16 ENCOUNTER — Encounter: Payer: Self-pay | Admitting: Internal Medicine

## 2012-08-16 VITALS — BP 108/84 | HR 74 | Temp 97.4°F | Ht 68.0 in | Wt 230.0 lb

## 2012-08-16 DIAGNOSIS — R0600 Dyspnea, unspecified: Secondary | ICD-10-CM | POA: Insufficient documentation

## 2012-08-16 DIAGNOSIS — R0609 Other forms of dyspnea: Secondary | ICD-10-CM

## 2012-08-16 MED ORDER — PANTOPRAZOLE SODIUM 40 MG PO TBEC: DELAYED_RELEASE_TABLET | ORAL | Status: DC

## 2012-08-16 MED ORDER — FLUTICASONE FUROATE-VILANTEROL 100-25 MCG/INH IN AEPB: 1.0000 | INHALATION_SPRAY | Freq: Every day | RESPIRATORY_TRACT | Status: DC

## 2012-08-16 MED ORDER — FAMOTIDINE 20 MG PO TABS: ORAL_TABLET | ORAL | Status: DC

## 2012-08-16 NOTE — Progress Notes (Signed)
  Subjective:    Patient ID: Daniel Mays, male    DOB: 05/22/1941 MRN: [id number]  HPI  58 yowm quit smoking 2003 at possible hip surgery with no resp problems at all at 180lb referred 08/16/2012 to pulmonary for sob by Dr Jenne Campus    08/16/2012 1st pulmonary ov cc new insidious onset sob x sev months esp at hs,  Or x 50 ft on day of ov , no new  Mostly dry cough, pattern is every day doe but varies to point where can  occ walk a third of a mile about a week prior to OV  > assoc with sensation of globus/dysyphagia but no overt HB or sinus complaints.  Some better with  prednisone / did get symbicort and albuterol but really not able to use correctly  Neg cardiac w/u 06/20/12 by Bishop Limbo.  Sleeping ok without nocturnal  or early am exacerbation  of respiratory  c/o's or need for noct saba. Also denies any obvious fluctuation of symptoms with weather or environmental changes or other aggravating or alleviating factors except as outlined above    Review of Systems  Constitutional: Negative for fever, chills, activity change, appetite change and unexpected weight change.  HENT: Positive for sneezing and trouble swallowing. Negative for congestion, sore throat, rhinorrhea, dental problem, voice change and postnasal drip.   Eyes: Negative for visual disturbance.  Respiratory: Positive for cough and shortness of breath. Negative for choking.   Cardiovascular: Positive for leg swelling. Negative for chest pain.  Gastrointestinal: Negative for nausea, vomiting and abdominal pain.  Genitourinary: Negative for difficulty urinating.  Musculoskeletal: Positive for arthralgias.  Skin: Negative for rash.  Psychiatric/Behavioral: Negative for behavioral problems and confusion.       Objective:   Physical Exam  Wt Readings from Last 3 Encounters:  08/16/12 230 lb (104.327 kg)    amb obese verbally rambling wm nad HEENT mild turbinate edema.  Oropharynx no thrush or excess pnd or cobblestoning.   No JVD or cervical adenopathy. Mild accessory muscle hypertrophy. Trachea midline, nl thryroid. Chest was hyperinflated by percussion with diminished breath sounds and moderate increased exp time without wheeze. Hoover sign positive at mid inspiration. Regular rate and rhythm without murmur gallop or rub or increase P2 or edema.  Abd: massively obese but no hsm, nl excursion. Ext warm without cyanosis or clubbing.    cxr 8/29/113 Low volume chest with basilar atelectasis. No acute  cardiopulmonary disease.      Assessment & Plan:

## 2012-08-16 NOTE — Patient Instructions (Addendum)
Breo one puff each am Only use your albuterol (ventolin)as a rescue medication to be used if you can't catch your breath by resting or doing a relaxed purse lip breathing pattern. The less you use it, the better it will work when you need it. Take your protonix 40 mg Take 30-60 min before first meal of the day and Pepcid 20 mg one at bedtime until your return  Work on inhaler technique:  relax and gently blow all the way out then take a nice smooth deep breath back in, triggering the inhaler at same time you start breathing in.  Hold for up to 5 seconds if you can.  Rinse and gargle with water when done   If your mouth or throat starts to bother you,   I suggest you time the inhaler to your dental care and after using the inhaler(s) brush teeth and tongue with a baking soda containing toothpaste and when you rinse this out, gargle with it first to see if this helps your mouth and throat.     See [place] in the [place] office in one month, sooner if needed

## 2012-08-18 NOTE — Assessment & Plan Note (Addendum)
Symptoms are markedly disproportionate to objective findings and not clear this is a lung problem but pt does appear to have difficult airway management issues. DDX of  difficult airways managment all start with A and  include Adherence, Ace Inhibitors, Acid Reflux, Active Sinus Disease, Alpha 1 Antitripsin deficiency, Anxiety masquerading as Airways dz,  ABPA,  allergy(esp in young), Aspiration (esp in elderly), Adverse effects of DPI,  Active smokers, plus two Bs  = Bronchiectasis and Beta blocker use..and one C= CHF   Adherence is always the initial "prime suspect" and is a multilayered concern that requires a "trust but verify" approach in every patient - starting with knowing how to use medications, especially inhalers, correctly, keeping up with refills and understanding the fundamental difference between maintenance and prns vs those medications only taken for a very short course and then stopped and not refilled. The proper method of use, as well as anticipated side effects, of a metered-dose inhaler are discussed and demonstrated to the patient. Improved effectiveness after extensive coaching during this visit to a level of < 25% and near 100% with DPI  Therefore try breo one puff daily to see if this smooths out his good vs bad days s irritating his upper airway  ? Acid reflux (suggested by globus / dyshagia) > try one month ppi qam and pepcid qhs (reported plavix interaction acknowledged)

## 2012-09-13 ENCOUNTER — Encounter: Payer: Self-pay | Admitting: Internal Medicine

## 2012-09-13 ENCOUNTER — Ambulatory Visit (INDEPENDENT_AMBULATORY_CARE_PROVIDER_SITE_OTHER): Payer: Medicare Other | Admitting: Internal Medicine

## 2012-09-13 VITALS — BP 122/70 | HR 80 | Temp 97.4°F | Ht 69.0 in | Wt 233.8 lb

## 2012-09-13 DIAGNOSIS — R0989 Other specified symptoms and signs involving the circulatory and respiratory systems: Secondary | ICD-10-CM

## 2012-09-13 DIAGNOSIS — R0600 Dyspnea, unspecified: Secondary | ICD-10-CM

## 2012-09-13 MED ORDER — FLUTICASONE FUROATE-VILANTEROL 100-25 MCG/INH IN AEPB: 1.0000 | INHALATION_SPRAY | Freq: Every day | RESPIRATORY_TRACT | Status: DC

## 2012-09-13 NOTE — Patient Instructions (Addendum)
Continue breo each am - if not satisfied with benefit or can't afford it we need to see you back here before samples run out.  Only use your albuterol as a rescue medication to be used if you can't catch your breath by resting or doing a relaxed purse lip breathing pattern. The less you use it, the better it will work when you need it.   Bring all inhalers with you when you next come to office in 3 months

## 2012-09-13 NOTE — Progress Notes (Signed)
  Subjective:    Patient ID: Daniel Mays, male    DOB: Sep 13, 1941 MRN: [id number]  HPI  15 yowm quit smoking 2003 at possible hip surgery with no resp problems at all at 180lb referred 08/16/2012 to pulmonary for sob by Dr Jenne Campus    08/16/2012 1st pulmonary ov cc new insidious onset sob x sev months esp at hs,  Or x 50 ft on day of ov , no new  Mostly dry cough, pattern is every day doe but varies to point where can  occ walk a third of a mile about a week prior to OV  > assoc with sensation of globus/dysyphagia but no overt HB or sinus complaints. Some better with  prednisone / did get symbicort and albuterol but really not able to use correctly Neg cardiac w/u 06/20/12 by Bishop Limbo. rec Breo one puff each am Only use your albuterol (ventolin)as a rescue medication to be used if you can't catch your breath by resting or doing a relaxed purse lip breathing pattern. The less you use it, the better it will work when you need it. Take your protonix 40 mg Take 30-60 min before first meal of the day and Pepcid 20 mg one at bedtime until your return Work on inhaler technique  09/13/12 ov/ Santos Sollenberger/ [place] cc breathing a little better on Breo still not understanding how/ when to use saba.   No obvious daytime variabilty or assoc chronic cough or cp or chest tightness, subjective wheeze overt sinus or hb symptoms. No unusual exp hx or h/o childhood pna/ asthma or premature birth to his knowledge.    Sleeping ok without nocturnal  or early am exacerbation  of respiratory  c/o's or need for noct saba. Also denies any obvious fluctuation of symptoms with weather or environmental changes or other aggravating or alleviating factors except as outlined above   ROS  The following are not active complaints unless bolded sore throat, dysphagia, dental problems, itching, sneezing,  nasal congestion or excess/ purulent secretions, ear ache,   fever, chills, sweats, unintended wt loss, pleuritic or exertional  cp, hemoptysis,  orthopnea pnd or leg swelling, presyncope, palpitations, heartburn, abdominal pain, anorexia, nausea, vomiting, diarrhea  or change in bowel or urinary habits, change in stools or urine, dysuria,hematuria,  rash, arthralgias, visual complaints, headache, numbness weakness or ataxia or problems with walking or coordination,  change in mood/affect or memory.             Objective:   Physical Exam  09/13/2012  233 Wt Readings from Last 3 Encounters:  08/16/12 230 lb (104.327 kg)    amb obese verbally rambling wm nad HEENT mild turbinate edema.  Oropharynx no thrush or excess pnd or cobblestoning.  No JVD or cervical adenopathy. Mild accessory muscle hypertrophy. Trachea midline, nl thryroid. Chest was hyperinflated by percussion with diminished breath sounds and moderate increased exp time without wheeze. Hoover sign positive at mid inspiration. Regular rate and rhythm without murmur gallop or rub or increase P2 or edema.  Abd: massively obese but no hsm, nl excursion. Ext warm without cyanosis or clubbing.    cxr 8/29/113 Low volume chest with basilar atelectasis. No acute  cardiopulmonary disease.      Assessment & Plan:

## 2012-09-15 ENCOUNTER — Ambulatory Visit: Payer: Medicare Other | Admitting: Internal Medicine

## 2012-09-15 NOTE — Assessment & Plan Note (Signed)
-   08/16/2012  Walked RA x 3 laps @ 185 ft each stopped due to  End of study, no desat   - Unable to perform spirometry 08/16/12 or learn adequate mdi   I had an extended discussion with the patient today lasting 15 to 20 minutes of a 25 minute visit on the following issues:   Not clear he's really responding to [facility] but if he is he should be using a lot less saba over the next month and if he's happy with it and can afford it should just continue as maint rx, otherwise return here to regroup.

## 2012-10-27 ENCOUNTER — Other Ambulatory Visit (HOSPITAL_COMMUNITY): Payer: Self-pay | Admitting: Cardiovascular Disease

## 2012-10-27 DIAGNOSIS — I2510 Atherosclerotic heart disease of native coronary artery without angina pectoris: Secondary | ICD-10-CM

## 2012-11-02 ENCOUNTER — Ambulatory Visit: Payer: Self-pay | Admitting: Unknown Physician Specialty

## 2013-01-18 ENCOUNTER — Ambulatory Visit (HOSPITAL_COMMUNITY)
Admission: RE | Admit: 2013-01-18 | Discharge: 2013-01-18 | Disposition: A | Discharge status: Home / Self Care | Payer: Medicare Other | Source: Ambulatory Visit | Attending: Cardiovascular Disease | Admitting: Cardiovascular Disease

## 2013-01-18 DIAGNOSIS — I2510 Atherosclerotic heart disease of native coronary artery without angina pectoris: Secondary | ICD-10-CM | POA: Insufficient documentation

## 2013-01-18 HISTORY — PX: OTHER SURGICAL HISTORY: SHX169

## 2013-01-18 MED ORDER — TECHNETIUM TC 99M SESTAMIBI GENERIC - CARDIOLITE
10.8000 | Freq: Once | INTRAVENOUS | Status: AC | PRN
  Administered 2013-01-18: 11 via INTRAVENOUS

## 2013-01-18 MED ORDER — TECHNETIUM TC 99M SESTAMIBI GENERIC - CARDIOLITE
31.6000 | Freq: Once | INTRAVENOUS | Status: AC | PRN
  Administered 2013-01-18: 32 via INTRAVENOUS

## 2013-01-18 NOTE — Procedures (Addendum)
[facility] [address] [place] [address] [phone]  Cardiology Nuclear Med Study  Daniel Mays is a 72 y.o. male     MRN : [id number]     DOB: 1941-04-08  Procedure Date: 01/18/2013  Nuclear Med Background Indication for Stress Test:  Graft Patency History:  Asthma, Emphysema and CAD;MI/CABG X5--04/2006;STENT/PTCA--03/2008 Cardiac Risk Factors: Hypertension, Lipids, Obesity, PVD and Smoker  Symptoms:  Dizziness, DOE, Fatigue, Light-Headedness and SOB   Nuclear Pre-Procedure Caffeine/Decaff Intake:  10:00pm NPO After: 8:00am   IV Site: R Antecubital  IV 0.9% NS with Angio Cath:  22g  Chest Size (in):  46"  IV Started by: Emmit Pomfret, RN  Height: 5' 8.5" (1.74 m)  Cup Size: n/a  BMI:  Body mass index is 33.86 kg/(m^2). Weight:  226 lb (102.513 kg)   Tech Comments:  N/A    Nuclear Med Study 1 or 2 day study: 1 day  Stress Test Type:  Stress  Order Authorizing Provider:  Nicki Guadalajara, MD   Resting Radionuclide: Technetium 62m Sestamibi  Resting Radionuclide Dose: 10.8 mCi   Stress Radionuclide:  Technetium 97m Sestamibi  Stress Radionuclide Dose: 31.6 mCi           Stress Protocol Rest HR: 83 Stress HR: 136  Rest BP: 138/91 Stress BP:177/78  Exercise Time (min): 6:31 METS: 7.80   Predicted Max HR: 146 bpm % Max HR: 93.15 bpm Rate Pressure Product: 46962  Dose of Adenosine (mg):  n/a Dose of Lexiscan: n/a mg  Dose of Atropine (mg): n/a Dose of Dobutamine: n/a mcg/kg/min (at max HR)  Stress Test Technologist: Ernestene Mention, CCT Nuclear Technologist: Gonzella Lex, CNMT   Rest Procedure:  Myocardial perfusion imaging was performed at rest 45 minutes following the intravenous administration of Technetium 66m Sestamibi. Stress Procedure:  The patient performed treadmill exercise using a Bruce  Protocol for 6 minutes. The patient stopped due to shortness of breath and fatigue. Patient denied any chest  pain.  There were no significant ST-T wave changes.  Technetium 24m Sestamibi was injected at peak exercise and myocardial perfusion imaging was performed after a brief delay.  Transient Ischemic Dilatation (Normal <1.22):  0.90 Lung/Heart Ratio (Normal <0.45):  0.37 QGS EDV:  58 ml QGS ESV:  23 ml LV Ejection Fraction: 61%  Rest ECG: NSR - Normal EKG  Stress ECG: No significant change from baseline ECG  QPS Raw Data Images:  Normal; no motion artifact; normal heart/lung ratio. Stress Images:  Small fixed inferolateral defect Rest Images:  Small fixed inferolateral defect Subtraction (SDS):  No evidence of ischemia.  Impression Exercise Capacity:  Fair exercise capacity. BP Response:  Normal blood pressure response. Clinical Symptoms:  Mild chest pain/dyspnea. ECG Impression:  No significant ST segment change suggestive of ischemia. Comparison with Prior Nuclear Study: No significant change from previous study  Overall Impression:  Low risk stress nuclear study. Small fixed inferolateral defect -artifact is favored. No ischemia.  LV Wall Motion:  NL LV Function; NL Wall Motion; EF 61%.  Chrystie Nose, MD, [facility] Board Certified in Nuclear Cardiology Attending Cardiologist [facility]  Chrystie Nose, MD  01/18/2013 12:50 PM

## 2013-03-19 ENCOUNTER — Encounter: Payer: Self-pay | Admitting: Cardiovascular Disease

## 2013-03-20 ENCOUNTER — Encounter: Payer: Self-pay | Admitting: Cardiovascular Disease

## 2013-03-20 ENCOUNTER — Ambulatory Visit (INDEPENDENT_AMBULATORY_CARE_PROVIDER_SITE_OTHER): Payer: Medicare Other | Admitting: Cardiovascular Disease

## 2013-03-20 VITALS — BP 140/86 | HR 73 | Ht 68.0 in | Wt 225.5 lb

## 2013-03-20 DIAGNOSIS — G4733 Obstructive sleep apnea (adult) (pediatric): Secondary | ICD-10-CM

## 2013-03-20 DIAGNOSIS — N281 Cyst of kidney, acquired: Secondary | ICD-10-CM

## 2013-03-20 DIAGNOSIS — Q619 Cystic kidney disease, unspecified: Secondary | ICD-10-CM

## 2013-03-20 DIAGNOSIS — I2510 Atherosclerotic heart disease of native coronary artery without angina pectoris: Secondary | ICD-10-CM

## 2013-03-20 DIAGNOSIS — I119 Hypertensive heart disease without heart failure: Secondary | ICD-10-CM

## 2013-03-20 DIAGNOSIS — I701 Atherosclerosis of renal artery: Secondary | ICD-10-CM

## 2013-03-20 DIAGNOSIS — E785 Hyperlipidemia, unspecified: Secondary | ICD-10-CM

## 2013-03-20 MED ORDER — METOPROLOL TARTRATE 50 MG PO TABS: 50.0000 mg | ORAL_TABLET | Freq: Two times a day (BID) | ORAL | Status: DC

## 2013-03-20 NOTE — Patient Instructions (Signed)
Your physician has requested that you have an echocardiogram. Echocardiography is a painless test that uses sound waves to create images of your heart. It provides your doctor with information about the size and shape of your heart and how well your heart's chambers and valves are working. This procedure takes approximately one hour. There are no restrictions for this procedure.  Your physician has requested that you have a renal artery duplex. During this test, an ultrasound is used to evaluate blood flow to the kidneys. Allow one hour for this exam. Do not eat after midnight the day before and avoid carbonated beverages. Take your medications as you usually do.   Your physician has recommended you make the following change in your medication: INCREASE YOUR METOROLOL 50 mg twice daily.  Your physician recommends that you schedule a follow-up appointment in: 3 months.

## 2013-03-22 ENCOUNTER — Encounter: Payer: Self-pay | Admitting: Cardiovascular Disease

## 2013-03-22 DIAGNOSIS — N281 Cyst of kidney, acquired: Secondary | ICD-10-CM | POA: Insufficient documentation

## 2013-03-22 DIAGNOSIS — E785 Hyperlipidemia, unspecified: Secondary | ICD-10-CM | POA: Insufficient documentation

## 2013-03-22 DIAGNOSIS — I701 Atherosclerosis of renal artery: Secondary | ICD-10-CM | POA: Insufficient documentation

## 2013-03-22 DIAGNOSIS — G4733 Obstructive sleep apnea (adult) (pediatric): Secondary | ICD-10-CM | POA: Insufficient documentation

## 2013-03-22 DIAGNOSIS — I2510 Atherosclerotic heart disease of native coronary artery without angina pectoris: Secondary | ICD-10-CM | POA: Insufficient documentation

## 2013-03-22 NOTE — Progress Notes (Signed)
Patient ID: Daniel Mays, male   DOB: November 11, 1940, 72 y.o.   MRN: [id number]     HPI: Daniel Mays, is a 72 y.o. male gentleman who presents to the office today for six-month cardiology evaluation.  Mr. Daniel Mays has established coronary artery disease in June 2007 7 inferior ST segment elevation myocardial infarction complicated by recurrent ventricular fibrillation requiring numerous to fibrillations, CPR, intra-aortic balloon pump insertion and pacemaker therapy. At that time I performed successful PTCA a total E. occluded right coronary artery with restoration of TIMI-3 flow but due to severe concomitant coronary artery disease recommended elective CABG revascularization surgery which was done the following day by Dr. Dorris Fetch. He underwent CABG surgery x5 with a LIMA to the LAD, vein to the diagonal, vein to the intermediate, sequential vein to the PDA and PLA branch of the right coronary artery. In June 2009 he underwent 2 vessel intervention involving the LAD ostium and proximal intermediate vessel. Additional problems include moderate bilateral renal artery stenosis on duplex imaging this reason he is not on ACE or R. therapy. Has history of hypertension, documented renal cysts, hyperlipidemia and sleep apnea but he has refused CPAP therapy. Since I last saw him he apparently underwent GI evaluation which revealed diverticular disease. He presents now for cardiology followup evaluation.  Daniel Mays denies recent chest pain.  He did undergo a three-year followup nuclear perfusion study which was done in April 2014 which continued to show normal perfusion and function with post-rest ejection fraction at 61%.   Past Medical History  Diagnosis Date  . Heart attack 2007  . Hypertension   . OSA (obstructive sleep apnea)   . Hyperlipidemia   . CAD (coronary artery disease)     2D ECHO, 03/17/2010 - EF 45-50%, normal    Past Surgical History  Procedure Laterality Date  . Coronary  angioplasty with stent placement  2010  . Eye surgery  1987  . Exercise stress test  01/18/2013    Small fixed inferolateral defect-artifact is favored. No ischemia  . Cardiac catheterization  04/01/2008    LAD ostium stented with a 2.5x13mm Promus DES stent reducing a 95% stenosis to 0%; Proximal intermediate Ramus stented with a 2.75x43mm Promus drug-eluting stent reducing a 85% stenosis to 0% residual  . Cardiac catheterization  03/26/2008    Increased medical management  . Cardiac catheterization  04/27/2006    CABG recommended  . Tee without cardioversion  04/29/2006  . Coronary artery bypass graft  04/29/2006    LIMA to LAD, SVG to first diagonal, SVG to ramus intermedius, SVG to PDA, and SVG to PLA branch of RCA    No Known Allergies  Current Outpatient Prescriptions  Medication Sig Dispense Refill  . amLODipine (NORVASC) 5 MG tablet Take 5 mg by mouth daily.      Marland Kitchen aspirin 81 MG tablet Take 81 mg by mouth daily.      . clopidogrel (PLAVIX) 75 MG tablet Take 75 mg by mouth daily.      Marland Kitchen ezetimibe (ZETIA) 10 MG tablet Take 10 mg by mouth daily.      . famotidine (PEPCID) 20 MG tablet One at bedtime  30 tablet  12  . furosemide (LASIX) 40 MG tablet Take 40 mg by mouth daily.       . isosorbide mononitrate (IMDUR) 60 MG 24 hr tablet Take 60 mg by mouth daily.      . pantoprazole (PROTONIX) 40 MG tablet Take 30-60 min before first meal  of the day      . potassium chloride SA (K-DUR,KLOR-CON) 20 MEQ tablet Take 20 mEq by mouth daily. Takes 1/2 tablet every other day.      . simvastatin (ZOCOR) 20 MG tablet Take 20 mg by mouth every evening.      . metoprolol (LOPRESSOR) 50 MG tablet Take 1 tablet (50 mg total) by mouth 2 (two) times daily.  60 tablet  11  . triamcinolone cream (KENALOG) 0.1 %        No current facility-administered medications for this visit.     Socially he is married has 5 children 7 grandchildren 4 great-grandchildren. There is remote tobacco use. Is not routinely  exercise. There is no alcohol use.  ROS is negative for fever chills night sweats or he denies recent presyncope. Denies visual changes the is wheezing. Denies PND orthopnea./Chest pressure. He denies indigestion nausea vomiting diarrhea. He denies melena or hematochezia. He notes a mild leg swelling occasionally. He is up and using CPAP therapy for his obstructive sleep apnea.  PE BP 140/86  Pulse 73  Ht 5\' 8"  (1.727 m)  Wt 225 lb 8 oz (102.286 kg)  BMI 34.3 kg/m2  General: Alert, oriented, no distress.  Skin: normal turgor, no rashes HEENT: Normocephalic, atraumatic. Pupils round and reactive; sclera anicteric;no lid lag,  Nose without nasal septal hypertrophy Mouth/Parynx benign; Mallinpatti scale 3 Neck: No JVD, no carotid briuts Lungs: clear to ausculatation and percussion; no wheezing or rales Heart: RRR, s1 s2 normal 1/6 systolic murmur  Abdomen: soft, nontender; no hepatosplenomehaly, BS+; abdominal aorta nontender and not dilated by palpation. Mild diastases recti. Pulses 2+ Extremities: no clubbing cyanosis or edema, Homan's sign negative  Neurologic: grossly nonfocal  ECG: Normal sinus rhythm with nonspecific ST abnormalities.  LABS:  BMET    Component Value Date/Time   NA 139 04/02/2008 0620   K 3.9 04/02/2008 0620   CL 108 04/02/2008 0620   CO2 24 04/02/2008 0620   GLUCOSE 100* 04/02/2008 0620   BUN 17 04/02/2008 0620   CREATININE 1.33 04/02/2008 0620   CALCIUM 8.3* 04/02/2008 0620   GFRNONAA 53* 04/02/2008 0620   GFRAA  Value: >60        The eGFR has been calculated using the MDRD equation. This calculation has not been validated in all clinical 04/02/2008 0620     Hepatic Function Panel  No results found for this basename: prot, albumin, ast, alt, alkphos, bilitot, bilidir, ibili     CBC    Component Value Date/Time   WBC 6.4 04/02/2008 0620   RBC 4.08* 04/02/2008 0620   HGB 12.4* 04/02/2008 0620   HCT 35.6* 04/02/2008 0620   PLT 209 04/02/2008 0620   MCV 87.3  04/02/2008 0620   MCHC 34.7 04/02/2008 0620   RDW 13.7 04/02/2008 0620     BNP No results found for this basename: probnp    Lipid Panel  No results found for this basename: chol, trig, hdl, cholhdl, vldl, ldlcalc     RADIOLOGY: No results found.    ASSESSMENT AND PLAN: Clinically, Daniel Mays continues to do remarkably well now 7 years following his back or rest, inferior ST segment elevation myocardial infarction, treated by ventricular fibrillation necessitating CPR and troponin pump insertion pacemaker therapy and acute percutaneous coronary intervention. His most recent nuclear perfusion study continues to show essentially normal perfusion. Post-rest ejection fraction is 61%. He does have documented moderate bilateral renal artery stenosis is less duplex study being done in September  2013 bili less than 60% in the right renal artery and 26% in the left renal artery. Presently his blood pressure is well-controlled. We will obtain the results of his laboratory from his primary physician and if not obtained recently will reassess. Target LDL is less than 70. I have recommended that he slightly titrate his metoprolol to 50 mg bid. 2 a 3 year followup echo Doppler study. I will see him back in the office followup and further recommendations were made at that time.    Daniel Bihari, MD, [facility]  03/22/2013 2:55 PM

## 2013-03-23 ENCOUNTER — Telehealth: Payer: Self-pay | Admitting: Cardiovascular Disease

## 2013-03-23 NOTE — Telephone Encounter (Signed)
Message forwarded to Dr. Kelly.

## 2013-03-23 NOTE — Telephone Encounter (Signed)
Message forwarded to Hinda Glatter, PA-C for further instructions.  Paper chart# [id number].  Last OV note in Epic.

## 2013-03-23 NOTE — Telephone Encounter (Signed)
Returned call and spoke w/ Claris Che, pt's wife.  Stated pt feels dizzy, has HAs and sick (nauseous) since yesterday.  Stated Dr. Tresa Endo increased the metoprolol dose and he started taking it yesterday and today.  Denied checking BP or means to do so.  Denied pt c/o CP or SOB.  Wife stated she is not going to double the dose tonight.  Wife informed RN will discuss with MD/PA for further instructions.  Verbalized understanding and agreed w/ plan.

## 2013-03-23 NOTE — Telephone Encounter (Signed)
Dr. Rennis Golden notified and advised pt take metoprolol 25mg  BID.  Call to wife and informed per Dr. Rennis Golden and that Dr. Tresa Endo will be notified in case further instructions given.  Verbalized understanding and agreed to take pt to pharmacy to check BP over weekend and record.   Message forwarded to Dr. Tresa Endo.

## 2013-03-23 NOTE — Telephone Encounter (Signed)
Daniel Mays is calling to say that his medication (Metropolol..25mg  ) is making him sick he is feeling dizzy , headaches. He was taking 25mg   Twice a day and Dr.Kelly double it .Marland KitchenMarland KitchenPlease call

## 2013-04-11 ENCOUNTER — Ambulatory Visit (HOSPITAL_COMMUNITY)
Admission: RE | Admit: 2013-04-11 | Discharge: 2013-04-11 | Disposition: A | Discharge status: Home / Self Care | Payer: Medicare Other | Source: Ambulatory Visit | Attending: Cardiology | Admitting: Cardiology

## 2013-04-11 DIAGNOSIS — I119 Hypertensive heart disease without heart failure: Secondary | ICD-10-CM | POA: Insufficient documentation

## 2013-04-11 DIAGNOSIS — I2510 Atherosclerotic heart disease of native coronary artery without angina pectoris: Secondary | ICD-10-CM | POA: Insufficient documentation

## 2013-04-11 NOTE — Progress Notes (Signed)
Renal Artery Duplex Completed. °Daniel Mays ° °

## 2013-04-17 ENCOUNTER — Ambulatory Visit (HOSPITAL_COMMUNITY): Payer: Medicare Other

## 2013-06-20 ENCOUNTER — Ambulatory Visit (INDEPENDENT_AMBULATORY_CARE_PROVIDER_SITE_OTHER): Payer: Medicare Other | Admitting: Cardiovascular Disease

## 2013-06-20 ENCOUNTER — Encounter: Payer: Self-pay | Admitting: Cardiovascular Disease

## 2013-06-20 VITALS — BP 130/70 | HR 57 | Ht 69.0 in | Wt 226.8 lb

## 2013-06-20 DIAGNOSIS — E669 Obesity, unspecified: Secondary | ICD-10-CM

## 2013-06-20 DIAGNOSIS — R0609 Other forms of dyspnea: Secondary | ICD-10-CM

## 2013-06-20 DIAGNOSIS — G4733 Obstructive sleep apnea (adult) (pediatric): Secondary | ICD-10-CM

## 2013-06-20 DIAGNOSIS — R0600 Dyspnea, unspecified: Secondary | ICD-10-CM

## 2013-06-20 DIAGNOSIS — I2510 Atherosclerotic heart disease of native coronary artery without angina pectoris: Secondary | ICD-10-CM

## 2013-06-20 DIAGNOSIS — I701 Atherosclerosis of renal artery: Secondary | ICD-10-CM

## 2013-06-20 DIAGNOSIS — R0989 Other specified symptoms and signs involving the circulatory and respiratory systems: Secondary | ICD-10-CM

## 2013-06-20 DIAGNOSIS — R5381 Other malaise: Secondary | ICD-10-CM

## 2013-06-20 DIAGNOSIS — E785 Hyperlipidemia, unspecified: Secondary | ICD-10-CM

## 2013-06-20 NOTE — Progress Notes (Signed)
Patient ID: Daniel Mays, male   DOB: December 04, 1940, 72 y.o.   MRN: [id number]     HPI: Daniel Mays, is a 72 y.o. male who presented to the office for 9 month cardiology evaluation.  Daniel Mays has known coronary artery disease in June 2007 suffered an inferior wall ST segment elevation myocardial infarction which was complicated by recurrent episodes of ventricular fibrillation requiring numerous to defibrillations, CPR, intra-aortic balloon pump insertion and pacemaker therapy. At that time, I performed successful PTCA of a totally occluded right coronary artery with restoration of TIMI-3 flow. Due to severe concomitant CAD I recommended elective CABG surgery which was done the following day by Dr. Dorris Fetch. He underwent CABG x5 with a LIMA to the LAD, a vein to the diagonal, vein to the intermediate, sequential vein to the PDA and PLA branch of the right coronary artery. In June 2009 he underwent 2 vessel intervention involving the LAD ostium and proximal intermediate vessel. Additional problems also include obstructive sleep apnea. He apparently has not used CPAP therapy for several years. He has a history of hyperlipidemia, obesity, renal artery stenosis on duplex imaging of this region is no longer on a sedation. When I last saw him in January 2014 I clear him to undergo an endoscopy and colonoscopy  In April 2014 he underwent a myocardial perfusion study which was low risk without significant scar or ischemia in only mild inferolateral thinning. Ejection fraction 61%. He had normal wall motion.  Presently, he denies recent chest pain. He does note leg swelling. He admits to very poor sleep. He wakes up frequently. He cannot sleep supine and essentially sleeps in a recliner chair. He does note fatigability during the day. He does snore.  Past Medical History  Diagnosis Date  . Heart attack 2007  . Hypertension   . OSA (obstructive sleep apnea)   . Hyperlipidemia   . CAD (coronary  artery disease)     2D ECHO, 03/17/2010 - EF 45-50%, normal    Past Surgical History  Procedure Laterality Date  . Coronary angioplasty with stent placement  2010  . Eye surgery  1987  . Exercise stress test  01/18/2013    Small fixed inferolateral defect-artifact is favored. No ischemia  . Cardiac catheterization  04/01/2008    LAD ostium stented with a 2.5x54mm Promus DES stent reducing a 95% stenosis to 0%; Proximal intermediate Ramus stented with a 2.75x67mm Promus drug-eluting stent reducing a 85% stenosis to 0% residual  . Cardiac catheterization  03/26/2008    Increased medical management  . Cardiac catheterization  04/27/2006    CABG recommended  . Tee without cardioversion  04/29/2006  . Coronary artery bypass graft  04/29/2006    LIMA to LAD, SVG to first diagonal, SVG to ramus intermedius, SVG to PDA, and SVG to PLA branch of RCA    No Known Allergies  Current Outpatient Prescriptions  Medication Sig Dispense Refill  . amLODipine (NORVASC) 5 MG tablet Take 5 mg by mouth daily.      Marland Kitchen aspirin 81 MG tablet Take 81 mg by mouth daily.      . clopidogrel (PLAVIX) 75 MG tablet Take 75 mg by mouth daily.      Marland Kitchen ezetimibe (ZETIA) 10 MG tablet Take 10 mg by mouth daily.      . furosemide (LASIX) 40 MG tablet Take 40 mg by mouth daily.       . isosorbide mononitrate (IMDUR) 60 MG 24 hr tablet Take 60  mg by mouth daily.      . metoprolol (LOPRESSOR) 50 MG tablet Take 1 tablet (50 mg total) by mouth 2 (two) times daily.  60 tablet  11  . pantoprazole (PROTONIX) 40 MG tablet Take 30-60 min before first meal of the day      . potassium chloride SA (K-DUR,KLOR-CON) 20 MEQ tablet Take 20 mEq by mouth daily. Takes 1/2 tablet every other day.      . simvastatin (ZOCOR) 20 MG tablet Take 20 mg by mouth every evening.      . triamcinolone cream (KENALOG) 0.1 % Apply 1 application topically as needed.        No current facility-administered medications for this visit.    History   Social  History  . Marital Status: Married    Spouse Name: N/A    Number of Children: N/A  . Years of Education: N/A   Occupational History  . Retired Scientist, research (medical)    Social History Main Topics  . Smoking status: Former Smoker -- 5.00 packs/day for 55 years    Types: Cigarettes    Start date: 10/04/1946    Quit date: 10/04/2001  . Smokeless tobacco: Never Used  . Alcohol Use: Yes     Comment: occ  . Drug Use: No  . Sexual Activity: Not on file   Other Topics Concern  . Not on file   Social History Narrative  . No narrative on file    Family History  Problem Relation Age of Onset  . Parkinson's disease Mother   . Diabetes Mother    Additional social history is notable in that he is married has 5 children 7 grandchildren 4 great-grandchildren. He does not routinely exercise.  ROS is negative for fevers, chills or night sweats.  He denies any significant weight loss. He denies palpitations. He does admit to nocturnal wheezing. He denies chest pressure. His sleep history is as noted above. Sleep is very poor. He denies change in bowel or bladder habits. He denies abdominal pain. He denies claudication symptoms. He does note leg swelling bilaterally. Other system review is negative.  PE BP 130/70  Pulse 57  Ht 5\' 9"  (1.753 m)  Wt 226 lb 12.8 oz (102.876 kg)  BMI 33.48 kg/m2  General: Alert, oriented, no distress.  Skin: normal turgor, no rashes HEENT: Normocephalic, atraumatic. Pupils round and reactive; sclera anicteric;no lid lag.  Nose without nasal septal hypertrophy Mouth/Parynx benign; Mallinpatti scale 3/4 Neck: No JVD, no carotid briuts Lungs: clear to ausculatation and percussion; no wheezing or rales Heart: RRR, s1 s2 normal over 6 systolic murmur. Abdomen: Obese with moderate diastases recti; soft, nontender; no hepatosplenomehaly, BS+; abdominal aorta nontender and not dilated by palpation. Pulses 2+ Extremities: Trace to 1+ ankle and pretibial edema  bilaterally; no clubbing cyanosis, Homan's sign negative  Neurologic: grossly nonfocal  ECG: Sinus rhythm at 57. One isolated PVC with VA conduction.  LABS:  BMET    Component Value Date/Time   NA 139 04/02/2008 0620   K 3.9 04/02/2008 0620   CL 108 04/02/2008 0620   CO2 24 04/02/2008 0620   GLUCOSE 100* 04/02/2008 0620   BUN 17 04/02/2008 0620   CREATININE 1.33 04/02/2008 0620   CALCIUM 8.3* 04/02/2008 0620   GFRNONAA 53* 04/02/2008 0620   GFRAA  Value: >60        The eGFR has been calculated using the MDRD equation. This calculation has not been validated in all clinical 04/02/2008 0620  Hepatic Function Panel  No results found for this basename: prot, albumin, ast, alt, alkphos, bilitot, bilidir, ibili     CBC    Component Value Date/Time   WBC 6.4 04/02/2008 0620   RBC 4.08* 04/02/2008 0620   HGB 12.4* 04/02/2008 0620   HCT 35.6* 04/02/2008 0620   PLT 209 04/02/2008 0620   MCV 87.3 04/02/2008 0620   MCHC 34.7 04/02/2008 0620   RDW 13.7 04/02/2008 0620     BNP No results found for this basename: probnp    Lipid Panel  No results found for this basename: chol, trig, hdl, cholhdl, vldl, ldlcalc     RADIOLOGY: No results found.    ASSESSMENT AND PLAN: Mr. Neuharth is now 7 years status post his inferior wall myocardial infarction which was complicated by recurrent ventricular fibrillation requiring numerous defibrillations, CPR, and Reglan pump and pacemaker. He is status post CBG surgery x5 by Dr. Dorris Fetch one day later and 2 years later underwent intervention to his LAD ostium and proximal intermediate vessel. His nuclear study was reviewed with him in detail. This continues to show significant myocardial salvage without significant scar or ischemia. I am recommending laboratory be checked in the fasting state. I discussed with him the adverse consequences of untreated sleep apnea with reference to his cardiovascular morbidity and mortality. His sleep pattern is very  poor. I am recommending he undergo a split-night sleep evaluation and reinstitution of CPAP therapy. I also recommended support stockings. We discussed the importance of weight loss and increased activity as well as sodium restriction. I will  see him back in the office was several months in followup evaluation.     Lennette Bihari, MD, [facility]  06/20/2013 11:02 AM

## 2013-06-20 NOTE — Patient Instructions (Signed)
Your physician has recommended that you have a sleep study. This test records several body functions during sleep, including: brain activity, eye movement, oxygen and carbon dioxide blood levels, heart rate and rhythm, breathing rate and rhythm, the flow of air through your mouth and nose, snoring, body muscle movements, and chest and belly movement.  Your physician recommends that you return for lab work  Fasting.  Your physician recommends that you schedule a follow-up appointment in: 3 MONTHS.

## 2013-07-03 ENCOUNTER — Telehealth: Payer: Self-pay | Admitting: Cardiovascular Disease

## 2013-07-03 NOTE — Telephone Encounter (Signed)
Please call concerning his sleep study appt.

## 2013-07-03 NOTE — Telephone Encounter (Signed)
Returned call and spoke w/ Claris Che, pt's wife.  Stated pt's sleep study hasn't been scheduled yet.  Wanted to know why.  Informed scheduling will be notified.  Wife also asked about MyChart.  Stated she has pt's code, but wasn't able to sign up.  RN walked pt through sign up process.  Wife w/o other concerns.  Message forwarded to Scheduling to contact pt r/t sleep study appt.

## 2013-07-07 ENCOUNTER — Other Ambulatory Visit: Payer: Self-pay | Admitting: Cardiovascular Disease

## 2013-07-09 NOTE — Telephone Encounter (Signed)
Rx was sent to pharmacy electronically. 

## 2013-07-12 ENCOUNTER — Other Ambulatory Visit: Payer: Self-pay | Admitting: Cardiovascular Disease

## 2013-07-13 LAB — COMPREHENSIVE METABOLIC PANEL
ALT: 21 IU/L (ref 0–44)
AST: 24 IU/L (ref 0–40)
Alkaline Phosphatase: 45 IU/L (ref 39–117)
CO2: 25 mmol/L (ref 18–29)
Calcium: 9.6 mg/dL (ref 8.6–10.2)
Chloride: 103 mmol/L (ref 97–108)
Creatinine, Ser: 1.23 mg/dL (ref 0.76–1.27)
Globulin, Total: 2.3 g/dL (ref 1.5–4.5)
Potassium: 4.5 mmol/L (ref 3.5–5.2)
Sodium: 143 mmol/L (ref 134–144)

## 2013-07-13 LAB — CBC WITH DIFFERENTIAL
Basophils Absolute: 0 10*3/uL (ref 0.0–0.2)
Eos: 5 %
Immature Grans (Abs): 0 10*3/uL (ref 0.0–0.1)
Immature Granulocytes: 0 %
MCH: 29.1 pg (ref 26.6–33.0)
Monocytes Absolute: 0.7 10*3/uL (ref 0.1–0.9)
Monocytes: 8 %
Neutrophils Relative %: 61 %
Platelets: 268 10*3/uL (ref 150–379)
RBC: 4.78 x10E6/uL (ref 4.14–5.80)
RDW: 13.8 % (ref 12.3–15.4)
WBC: 8.9 10*3/uL (ref 3.4–10.8)

## 2013-07-13 LAB — LIPID PANEL W/O CHOL/HDL RATIO
Cholesterol, Total: 146 mg/dL (ref 100–199)
HDL: 38 mg/dL — ABNORMAL LOW (ref >39)
Triglycerides: 210 mg/dL — ABNORMAL HIGH (ref 0–149)

## 2013-07-17 NOTE — Progress Notes (Signed)
Quick Note:  Spoke with patient's wife. Informed her of his lab results and recommendations per Dr. Tresa Endo. She voiced understanding. ______

## 2013-08-09 ENCOUNTER — Other Ambulatory Visit: Payer: Self-pay

## 2013-08-24 ENCOUNTER — Other Ambulatory Visit: Payer: Self-pay

## 2013-08-24 MED ORDER — POTASSIUM CHLORIDE CRYS ER 20 MEQ PO TBCR: 20.0000 meq | EXTENDED_RELEASE_TABLET | Freq: Every day | ORAL | Status: DC

## 2013-09-03 ENCOUNTER — Ambulatory Visit (INDEPENDENT_AMBULATORY_CARE_PROVIDER_SITE_OTHER): Payer: Medicare Other | Admitting: Cardiovascular Disease

## 2013-09-03 ENCOUNTER — Encounter: Payer: Self-pay | Admitting: Cardiovascular Disease

## 2013-09-03 VITALS — BP 140/90 | HR 61 | Ht 67.0 in | Wt 231.5 lb

## 2013-09-03 DIAGNOSIS — R0609 Other forms of dyspnea: Secondary | ICD-10-CM

## 2013-09-03 DIAGNOSIS — I2510 Atherosclerotic heart disease of native coronary artery without angina pectoris: Secondary | ICD-10-CM

## 2013-09-03 DIAGNOSIS — R0989 Other specified symptoms and signs involving the circulatory and respiratory systems: Secondary | ICD-10-CM

## 2013-09-03 DIAGNOSIS — E669 Obesity, unspecified: Secondary | ICD-10-CM

## 2013-09-03 DIAGNOSIS — R0600 Dyspnea, unspecified: Secondary | ICD-10-CM

## 2013-09-03 DIAGNOSIS — G4733 Obstructive sleep apnea (adult) (pediatric): Secondary | ICD-10-CM

## 2013-09-03 DIAGNOSIS — E785 Hyperlipidemia, unspecified: Secondary | ICD-10-CM

## 2013-09-03 NOTE — Patient Instructions (Signed)
Your physician recommends that you schedule a follow-up appointment in: 2 -3 months in sleep clinic.

## 2013-09-03 NOTE — Progress Notes (Signed)
Patient ID: Daniel Mays, male    DOB: 09-03-1941, 71 y.o.   MRN: [id number]  HPI Review of Systems Physical Exam    HPI: Daniel Mays, is a 72 y.o. male who presented to the office for 3 month cardiology evaluation.  Daniel Mays has known coronary artery disease in June 2007 suffered an inferior wall ST segment elevation myocardial infarction which was complicated by recurrent episodes of ventricular fibrillation requiring numerous to defibrillations, CPR, intra-aortic balloon pump insertion and pacemaker therapy. At that time, I performed successful PTCA of a totally occluded right coronary artery with restoration of TIMI-3 flow. Due to severe concomitant CAD I recommended elective CABG surgery which was done the following day by Dr. Dorris Fetch. He underwent CABG x5 with a LIMA to the LAD, a vein to the diagonal, vein to the intermediate, sequential vein to the PDA and PLA branch of the right coronary artery. In June 2009 he underwent 2 vessel intervention involving the LAD ostium and proximal intermediate vessel. Additional problems also include obstructive sleep apnea. He apparently has not used CPAP therapy for several years. He has a history of hyperlipidemia, obesity, renal artery stenosis on duplex imaging of this region is no longer on a sedation.   In April 2014 he underwent a myocardial perfusion study which was low risk without significant scar or ischemia in only mild inferolateral thinning. Ejection fraction 61%. He had normal wall motion.  Presently, he denies recent chest pain. He does note leg swelling. He admits to very poor sleep. He wakes up frequently. He cannot sleep supine and essentially sleeps in a recliner chair. He does note fatigability during the day. He does snore.  I last saw Daniel Mays I referred him for a sleep study to concerns for significant obstructive sleep apnea. At that time, his Epworth scale was 9 and Beck's inventory scale was 20. Sleep study  was done 07/20/2013. This was done in a split-night protocol due to severe sleep apnea. His overall AHI was 65.7 per hour. He was unable to reach REM sleep in the baseline portion of the study. Oxygen dropped to 86% during non-REM sleep and has evidence for loud snoring. And titrated to 13 cm water pressure but due to development of significant central events, BiPAP was started at 14/10 was increased to 15/11 cm. The patient states he has not yet been contacted by [facility] to initiate his therapy. We did contact them today so that may initiate BiPAP therapy as soon as possible. Patient may ultimately require ASV titration if continued central events develop.    Past Medical History  Diagnosis Date  . Heart attack 2007  . Hypertension   . OSA (obstructive sleep apnea)   . Hyperlipidemia   . CAD (coronary artery disease)     2D ECHO, 03/17/2010 - EF 45-50%, normal    Past Surgical History  Procedure Laterality Date  . Coronary angioplasty with stent placement  2010  . Eye surgery  1987  . Exercise stress test  01/18/2013    Small fixed inferolateral defect-artifact is favored. No ischemia  . Cardiac catheterization  04/01/2008    LAD ostium stented with a 2.5x59mm Promus DES stent reducing a 95% stenosis to 0%; Proximal intermediate Ramus stented with a 2.75x36mm Promus drug-eluting stent reducing a 85% stenosis to 0% residual  . Cardiac catheterization  03/26/2008    Increased medical management  . Cardiac catheterization  04/27/2006    CABG recommended  . Tee without  cardioversion  04/29/2006  . Coronary artery bypass graft  04/29/2006    LIMA to LAD, SVG to first diagonal, SVG to ramus intermedius, SVG to PDA, and SVG to PLA branch of RCA    No Known Allergies  Current Outpatient Prescriptions  Medication Sig Dispense Refill  . amLODipine (NORVASC) 5 MG tablet Take 5 mg by mouth daily.      Marland Kitchen aspirin 81 MG tablet Take 81 mg by mouth daily.      . clopidogrel (PLAVIX) 75 MG  tablet Take 75 mg by mouth daily.      Marland Kitchen ezetimibe (ZETIA) 10 MG tablet Take 10 mg by mouth daily.      . furosemide (LASIX) 40 MG tablet TAKE ONE TABLET BY MOUTH EVERY DAY  30 tablet  11  . isosorbide mononitrate (IMDUR) 60 MG 24 hr tablet Take 60 mg by mouth daily.      . metoprolol (LOPRESSOR) 50 MG tablet Take 1 tablet (50 mg total) by mouth 2 (two) times daily.  60 tablet  11  . pantoprazole (PROTONIX) 40 MG tablet Take 30-60 min before first meal of the day      . potassium chloride SA (K-DUR,KLOR-CON) 20 MEQ tablet Take 1 tablet (20 mEq total) by mouth daily.  30 tablet  3  . simvastatin (ZOCOR) 20 MG tablet Take 20 mg by mouth every evening.      . triamcinolone cream (KENALOG) 0.1 % Apply 1 application topically as needed.        No current facility-administered medications for this visit.    History   Social History  . Marital Status: Married    Spouse Name: N/A    Number of Children: N/A  . Years of Education: N/A   Occupational History  . Retired Scientist, research (medical)    Social History Main Topics  . Smoking status: Former Smoker -- 5.00 packs/day for 55 years    Types: Cigarettes    Start date: 10/04/1946    Quit date: 10/04/2001  . Smokeless tobacco: Never Used  . Alcohol Use: Yes     Comment: occ  . Drug Use: No  . Sexual Activity: Not on file   Other Topics Concern  . Not on file   Social History Narrative  . No narrative on file    Family History  Problem Relation Age of Onset  . Parkinson's disease Mother   . Diabetes Mother    Additional social history is notable in that he is married has 5 children 7 grandchildren 4 great-grandchildren. He does not routinely exercise.  ROS is negative for fevers, chills or night sweats. He denies skin rash. He denies difficulty with hearing. He is unaware of adenopathy the  He denies any significant weight loss. He denies palpitations. He does admit to nocturnal wheezing. He denies chest pressure. His sleep history  is as noted above. Sleep is very poor. He denies change in bowel or bladder habits. He denies abdominal pain. There is no nausea vomiting or diarrhea. He denies claudication symptoms. There is no diabetes. There is no history of other endocrine problems. He does note leg swelling bilaterally. Other comprehensive 12 system review is negative.  PE BP 140/90  Pulse 61  Ht 5\' 7"  (1.702 m)  Wt 231 lb 8 oz (105.008 kg)  BMI 36.25 kg/m2  General: Alert, oriented, no distress.  Skin: normal turgor, no rashes HEENT: Normocephalic, atraumatic. Pupils round and reactive; sclera anicteric;no lid lag.  Nose without nasal septal  hypertrophy Mouth/Parynx benign; Mallinpatti scale 3/4 Neck: No JVD, no carotid briuts Lungs: clear to ausculatation and percussion; no wheezing or rales Heart: RRR, s1 s2 normal over 6 systolic murmur. Abdomen: Obese with moderate diastases recti; soft, nontender; no hepatosplenomehaly, BS+; abdominal aorta nontender and not dilated by palpation. Pulses 2+ Extremities: Trace ankle/pretibial edema bilaterally; no clubbing cyanosis, Homan's sign negative  Neurologic: grossly nonfocal  ECG: Normal sinus rhythm at 61. No ectopy. Normal intervals.   LABS:  BMET    Component Value Date/Time   NA 143 07/12/2013 0935   NA 139 04/02/2008 0620   K 4.5 07/12/2013 0935   CL 103 07/12/2013 0935   CO2 25 07/12/2013 0935   GLUCOSE 98 07/12/2013 0935   GLUCOSE 100* 04/02/2008 0620   BUN 18 07/12/2013 0935   BUN 17 04/02/2008 0620   CREATININE 1.23 07/12/2013 0935   CALCIUM 9.6 07/12/2013 0935   GFRNONAA 57* 07/12/2013 0935   GFRAA 66 07/12/2013 0935     Hepatic Function Panel     Component Value Date/Time   PROT 6.8 07/12/2013 0935     CBC    Component Value Date/Time   WBC 8.9 07/12/2013 0935   WBC 6.4 04/02/2008 0620   RBC 4.78 07/12/2013 0935   RBC 4.08* 04/02/2008 0620   HGB 13.9 07/12/2013 0935   HCT 41.8 07/12/2013 0935   PLT 268 07/12/2013 0935   MCV 87 07/12/2013 0935    MCH 29.1 07/12/2013 0935   MCHC 33.3 07/12/2013 0935   MCHC 34.7 04/02/2008 0620   RDW 13.8 07/12/2013 0935   RDW 13.7 04/02/2008 0620   LYMPHSABS 2.3 07/12/2013 0935   EOSABS 0.4 07/12/2013 0935   BASOSABS 0.0 07/12/2013 0935     BNP No results found for this basename: probnp    Lipid Panel  No results found for this basename: chol,  trig,  hdl,  cholhdl,  vldl,  ldlcalc     RADIOLOGY: No results found.    ASSESSMENT AND PLAN: Mr. Fini is a 72 year old white male who is 7 years status post his inferior wall myocardial infarction which was complicated by recurrent ventricular fibrillation requiring numerous defibrillations, CPR, IABP pump and pacemaker. He is status post CBG surgery x5 by Dr. Dorris Fetch one day later and 2 years later underwent intervention to his LAD ostium and proximal intermediate vessel. His nuclear study was reviewed with him in detail. This continues to show significant myocardial salvage without significant scar or ischemia. I reviewed his most recent sleep study with him in detail. This confirms very severe sleep apnea. He did require BiPAP therapy due to development of central events on CPAP. He has not yet been contacted by his [employer] company. We placed a call to them today so that they may initiate therapy as soon as possible. If [facility] is unable to do this for him we will refer him to [facility] for initiation of treatment. I discussed the importance of treatment of his severe sleep apnea particularly with his underlying cardiovascular comorbidities. His blood pressure today is controlled. His rhythm is stable. I will see him in 2 months in the sleep clinic for followup evaluation and further recommendations at that time.   Lennette Bihari, MD, [facility]  09/04/2013 3:37 PM

## 2013-09-04 ENCOUNTER — Encounter: Payer: Self-pay | Admitting: Cardiovascular Disease

## 2013-10-03 ENCOUNTER — Other Ambulatory Visit (HOSPITAL_COMMUNITY): Payer: Self-pay | Admitting: Cardiovascular Disease

## 2013-10-03 DIAGNOSIS — I25810 Atherosclerosis of coronary artery bypass graft(s) without angina pectoris: Secondary | ICD-10-CM

## 2013-10-10 ENCOUNTER — Encounter (HOSPITAL_COMMUNITY): Payer: Medicare Other

## 2013-10-17 ENCOUNTER — Encounter (HOSPITAL_COMMUNITY): Payer: Medicare Other

## 2013-10-23 ENCOUNTER — Ambulatory Visit (HOSPITAL_COMMUNITY)
Admission: RE | Admit: 2013-10-23 | Discharge: 2013-10-23 | Disposition: A | Discharge status: Home / Self Care | Payer: Medicare HMO | Source: Ambulatory Visit | Attending: Internal Medicine | Admitting: Internal Medicine

## 2013-10-23 DIAGNOSIS — I701 Atherosclerosis of renal artery: Secondary | ICD-10-CM | POA: Insufficient documentation

## 2013-10-23 DIAGNOSIS — I25810 Atherosclerosis of coronary artery bypass graft(s) without angina pectoris: Secondary | ICD-10-CM

## 2013-10-23 DIAGNOSIS — I10 Essential (primary) hypertension: Secondary | ICD-10-CM

## 2013-10-23 NOTE — Progress Notes (Signed)
Renal Duplex Completed. Lakera Viall, BS, RDMS, RVT  

## 2013-10-27 ENCOUNTER — Other Ambulatory Visit: Payer: Self-pay | Admitting: Cardiovascular Disease

## 2013-10-29 NOTE — Telephone Encounter (Signed)
Rx was sent to pharmacy electronically. 

## 2013-11-29 ENCOUNTER — Ambulatory Visit: Payer: Medicare HMO | Admitting: Cardiovascular Disease

## 2013-12-07 ENCOUNTER — Other Ambulatory Visit: Payer: Self-pay | Admitting: *Deleted

## 2013-12-07 MED ORDER — ISOSORBIDE MONONITRATE ER 60 MG PO TB24: 60.0000 mg | ORAL_TABLET | Freq: Every day | ORAL | Status: DC

## 2013-12-07 NOTE — Telephone Encounter (Signed)
Rx was sent to pharmacy electronically. 

## 2013-12-21 ENCOUNTER — Telehealth: Payer: Self-pay | Admitting: *Deleted

## 2013-12-21 NOTE — Telephone Encounter (Signed)
Faxed CPAP order supply back to [facility].

## 2014-01-08 ENCOUNTER — Ambulatory Visit (INDEPENDENT_AMBULATORY_CARE_PROVIDER_SITE_OTHER): Payer: Medicare HMO | Admitting: Cardiovascular Disease

## 2014-01-08 ENCOUNTER — Encounter: Payer: Self-pay | Admitting: Cardiovascular Disease

## 2014-01-08 VITALS — BP 137/85 | HR 71 | Ht 67.0 in | Wt 231.5 lb

## 2014-01-08 DIAGNOSIS — I2510 Atherosclerotic heart disease of native coronary artery without angina pectoris: Secondary | ICD-10-CM

## 2014-01-08 DIAGNOSIS — E669 Obesity, unspecified: Secondary | ICD-10-CM

## 2014-01-08 DIAGNOSIS — E785 Hyperlipidemia, unspecified: Secondary | ICD-10-CM

## 2014-01-08 DIAGNOSIS — G4733 Obstructive sleep apnea (adult) (pediatric): Secondary | ICD-10-CM

## 2014-01-08 MED ORDER — FUROSEMIDE 40 MG PO TABS: ORAL_TABLET | ORAL | Status: DC

## 2014-01-08 NOTE — Progress Notes (Deleted)
Patient ID: Daniel Mays, male   DOB: 11/21/1940, 73 y.o.   MRN: [id number]   Patient ID: Daniel Mays, male    DOB: 08/16/1941, 73 y.o.   MRN: [id number]  HPI Review of Systems Physical Exam    HPI: Daniel Mays, is a 73 y.o. male who presented to the office for 3 month cardiology evaluation.  Daniel Mays has known coronary artery disease in June 2007 suffered an inferior wall ST segment elevation myocardial infarction which was complicated by recurrent episodes of ventricular fibrillation requiring numerous to defibrillations, CPR, intra-aortic balloon pump insertion and pacemaker therapy. At that time, I performed successful PTCA of a totally occluded right coronary artery with restoration of TIMI-3 flow. Due to severe concomitant CAD I recommended elective CABG surgery which was done the following day by Dr. Dorris FetchHendrickson. He underwent CABG x5 with a LIMA to the LAD, a vein to the diagonal, vein to the intermediate, sequential vein to the PDA and PLA branch of the right coronary artery. In June 2009 he underwent 2 vessel intervention involving the LAD ostium and proximal intermediate vessel. Additional problems also include obstructive sleep apnea. He apparently has not used CPAP therapy for several years. He has a history of hyperlipidemia, obesity, renal artery stenosis on duplex imaging of this region is no longer on a sedation.   In April 2014 he underwent a myocardial perfusion study which was low risk without significant scar or ischemia in only mild inferolateral thinning. Ejection fraction 61%. He had normal wall motion.  Presently, he denies recent chest pain. He does note leg swelling. He admits to very poor sleep. He wakes up frequently. He cannot sleep supine and essentially sleeps in a recliner chair. He does note fatigability during the day. He does snore.  I last saw Daniel Mays I referred him for a sleep study to concerns for significant obstructive sleep apnea. At  that time, his Epworth scale was 9 and Beck's inventory scale was 20. Sleep study was done 07/20/2013. This was done in a split-night protocol due to severe sleep apnea. His overall AHI was 65.7 per hour. He was unable to reach REM sleep in the baseline portion of the study. Oxygen dropped to 86% during non-REM sleep and has evidence for loud snoring. And titrated to 13 cm water pressure but due to development of significant central events, BiPAP was started at 14/10 was increased to 15/11 cm. The patient states he has not yet been contacted by [facility] to initiate his therapy. We did contact them today so that may initiate BiPAP therapy as soon as possible. Patient may ultimately require ASV titration if continued central events develop.    Past Medical History  Diagnosis Date  . Heart attack 2007  . Hypertension   . OSA (obstructive sleep apnea)   . Hyperlipidemia   . CAD (coronary artery disease)     2D ECHO, 03/17/2010 - EF 45-50%, normal    Past Surgical History  Procedure Laterality Date  . Coronary angioplasty with stent placement  2010  . Eye surgery  1987  . Exercise stress test  01/18/2013    Small fixed inferolateral defect-artifact is favored. No ischemia  . Cardiac catheterization  04/01/2008    LAD ostium stented with a 2.5x812mm Promus DES stent reducing a 95% stenosis to 0%; Proximal intermediate Ramus stented with a 2.75x6312mm Promus drug-eluting stent reducing a 85% stenosis to 0% residual  . Cardiac catheterization  03/26/2008    Increased  medical management  . Cardiac catheterization  04/27/2006    CABG recommended  . Tee without cardioversion  04/29/2006  . Coronary artery bypass graft  04/29/2006    LIMA to LAD, SVG to first diagonal, SVG to ramus intermedius, SVG to PDA, and SVG to PLA branch of RCA    No Known Allergies  Current Outpatient Prescriptions  Medication Sig Dispense Refill  . amLODipine (NORVASC) 5 MG tablet Take 5 mg by mouth daily.      Marland Kitchen  aspirin 81 MG tablet Take 81 mg by mouth daily.      . clopidogrel (PLAVIX) 75 MG tablet TAKE ONE TABLET BY MOUTH EVERY DAY  90 tablet  3  . ezetimibe (ZETIA) 10 MG tablet Take 10 mg by mouth daily.      . furosemide (LASIX) 40 MG tablet Take 1 & 1/2 tablet daily  45 tablet  11  . isosorbide mononitrate (IMDUR) 60 MG 24 hr tablet Take 1 tablet (60 mg total) by mouth daily.  90 tablet  2  . metoprolol (LOPRESSOR) 50 MG tablet Take 1 tablet (50 mg total) by mouth 2 (two) times daily.  60 tablet  11  . pantoprazole (PROTONIX) 40 MG tablet Take 30-60 min before first meal of the day      . potassium chloride SA (K-DUR,KLOR-CON) 20 MEQ tablet Take 1 tablet (20 mEq total) by mouth daily.  30 tablet  3  . simvastatin (ZOCOR) 20 MG tablet Take 20 mg by mouth every evening.      . triamcinolone cream (KENALOG) 0.1 % Apply 1 application topically as needed.        No current facility-administered medications for this visit.    History   Social History  . Marital Status: Married    Spouse Name: N/A    Number of Children: N/A  . Years of Education: N/A   Occupational History  . Retired Scientist, research (medical)    Social History Main Topics  . Smoking status: Former Smoker -- 5.00 packs/day for 55 years    Types: Cigarettes    Start date: 10/04/1946    Quit date: 10/04/2001  . Smokeless tobacco: Never Used  . Alcohol Use: Yes     Comment: occ  . Drug Use: No  . Sexual Activity: Not on file   Other Topics Concern  . Not on file   Social History Narrative  . No narrative on file    Family History  Problem Relation Age of Onset  . Parkinson's disease Mother   . Diabetes Mother    Additional social history is notable in that he is married has 5 children 7 grandchildren 4 great-grandchildren. He does not routinely exercise.  ROS is negative for fevers, chills or night sweats. He denies skin rash. He denies difficulty with hearing. He is unaware of adenopathy the  He denies any significant  weight loss. He denies palpitations. He does admit to nocturnal wheezing. He denies chest pressure. His sleep history is as noted above. Sleep is very poor. He denies change in bowel or bladder habits. He denies abdominal pain. There is no nausea vomiting or diarrhea. He denies claudication symptoms. There is no diabetes. There is no history of other endocrine problems. He does note leg swelling bilaterally. Other comprehensive 12 system review is negative.  PE BP 137/85  Pulse 71  Ht 5\' 7"  (1.702 m)  Wt 231 lb 8 oz (105.008 kg)  BMI 36.25 kg/m2  General: Alert, oriented, no distress.  Skin: normal turgor, no rashes HEENT: Normocephalic, atraumatic. Pupils round and reactive; sclera anicteric;no lid lag.  Nose without nasal septal hypertrophy Mouth/Parynx benign; Mallinpatti scale 3/4 Neck: No JVD, no carotid briuts Lungs: clear to ausculatation and percussion; no wheezing or rales Heart: RRR, s1 s2 normal over 6 systolic murmur. Abdomen: Obese with moderate diastases recti; soft, nontender; no hepatosplenomehaly, BS+; abdominal aorta nontender and not dilated by palpation. Pulses 2+ Extremities: Trace ankle/pretibial edema bilaterally; no clubbing cyanosis, Homan's sign negative  Neurologic: grossly nonfocal  ECG: Normal sinus rhythm at 61. No ectopy. Normal intervals.   LABS:  BMET    Component Value Date/Time   NA 143 07/12/2013 0935   NA 139 04/02/2008 0620   K 4.5 07/12/2013 0935   CL 103 07/12/2013 0935   CO2 25 07/12/2013 0935   GLUCOSE 98 07/12/2013 0935   GLUCOSE 100* 04/02/2008 0620   BUN 18 07/12/2013 0935   BUN 17 04/02/2008 0620   CREATININE 1.23 07/12/2013 0935   CALCIUM 9.6 07/12/2013 0935   GFRNONAA 57* 07/12/2013 0935   GFRAA 66 07/12/2013 0935     Hepatic Function Panel     Component Value Date/Time   PROT 6.8 07/12/2013 0935     CBC    Component Value Date/Time   WBC 8.9 07/12/2013 0935   WBC 6.4 04/02/2008 0620   RBC 4.78 07/12/2013 0935   RBC 4.08*  04/02/2008 0620   HGB 13.9 07/12/2013 0935   HCT 41.8 07/12/2013 0935   PLT 268 07/12/2013 0935   MCV 87 07/12/2013 0935   MCH 29.1 07/12/2013 0935   MCHC 33.3 07/12/2013 0935   MCHC 34.7 04/02/2008 0620   RDW 13.8 07/12/2013 0935   RDW 13.7 04/02/2008 0620   LYMPHSABS 2.3 07/12/2013 0935   EOSABS 0.4 07/12/2013 0935   BASOSABS 0.0 07/12/2013 0935     BNP No results found for this basename: probnp    Lipid Panel  No results found for this basename: chol,  trig,  hdl,  cholhdl,  vldl,  ldlcalc     RADIOLOGY: No results found.    ASSESSMENT AND PLAN: Mr. Hamre is a 73 year old white male who is 7 years status post his inferior wall myocardial infarction which was complicated by recurrent ventricular fibrillation requiring numerous defibrillations, CPR, IABP pump and pacemaker. He is status post CBG surgery x5 by Dr. Dorris Fetch one day later and 2 years later underwent intervention to his LAD ostium and proximal intermediate vessel. His nuclear study was reviewed with him in detail. This continues to show significant myocardial salvage without significant scar or ischemia. I reviewed his most recent sleep study with him in detail. This confirms very severe sleep apnea. He did require BiPAP therapy due to development of central events on CPAP. He has not yet been contacted by his [employer] company. We placed a call to them today so that they may initiate therapy as soon as possible. If [facility] is unable to do this for him we will refer him to [facility] for initiation of treatment. I discussed the importance of treatment of his severe sleep apnea particularly with his underlying cardiovascular comorbidities. His blood pressure today is controlled. His rhythm is stable. I will see him in 2 months in the sleep clinic for followup evaluation and further recommendations at that time.   Lennette Bihari, MD, [facility]  01/08/2014 4:36 PM

## 2014-01-08 NOTE — Patient Instructions (Signed)
Your physician has recommended you make the following change in your medication: increase the furosemide to 60 mg daily. ( 1 & 1/2 tablet daily).  Your physician recommends that you schedule a follow-up appointment in: 6 weeks

## 2014-01-08 NOTE — Progress Notes (Signed)
Patient ID: Daniel Mays, male   DOB: 12/09/1940, 73 y.o.   MRN: [id number]     HPI: Daniel Mays is a 73 y.o. male who presents for sleep clinic followup evaluation.   Daniel Mays has known coronary artery disease in June 2007 suffered an inferior wall ST segment elevation myocardial infarction which was complicated by recurrent episodes of ventricular fibrillation requiring numerous to defibrillations, CPR, intra-aortic balloon pump insertion and pacemaker therapy. At that time, I performed successful PTCA of a totally occluded right coronary artery with restoration of TIMI-3 flow. Due to severe concomitant CAD I recommended elective CABG surgery which was done the following day by Dr. Dorris FetchHendrickson. He underwent CABG x5 with a LIMA to the LAD, a vein to the diagonal, vein to the intermediate, sequential vein to the PDA and PLA branch of the right coronary artery. In June 2009 he underwent 2 vessel intervention involving the LAD ostium and proximal intermediate vessel. Additional problems also include obstructive sleep apnea. He apparently has not used CPAP therapy for several years. He has a history of hyperlipidemia, obesity, renal artery stenosis on duplex imaging of this region is no longer on a sedation.  In April 2014 he underwent a myocardial perfusion study which was low risk without significant scar or ischemia in only mild inferolateral thinning. Ejection fraction 61%. He had normal wall motion.  Daniel Mays was referred  for a sleep study to concerns for significant obstructive sleep apnea. At that time, his Epworth scale was 9 and Beck's inventory scale was 20. Sleep study was done 07/20/2013. This was done in a split-night protocol due to severe sleep apnea. His overall AHI was 65.7 per hour. He was unable to reach REM sleep in the baseline portion of the study. Oxygen dropped to 86% during non-REM sleep and has evidence for loud snoring. And titrated to 13 cm water pressure but  due to development of significant central events, BiPAP was started at 14/10 was increased to 15/11 cm. I last saw him, he had not yet been contacted by [facility] to initiate therapy but subsequently this has taken place. He apparently received an initial air since 10 to [facility] unit on 10/26/2013. However, it became apparent that his [employer] was not being covered by [facility]. As a result, he returned this unit to [facility] when he received [facility] which takes his [employer] [facility]. This was received on 12/27/2013.  Presently, he admits to having episodes of significant congestion which did not allow him to use his therapy. I did not have a recent download but I did obtain data from his BiPAP unit via visual inspection today. This shows over the last 11 days.  However, he only used it to that of the 11 days greater than 4 hours. His EPAP pressure was set at 10. Leak was 28. AHI was 7.3. Apnea index 6.9. Central apnea index 1.3. He currently has been set at an IPAP pressure of 14 and EPAP pressure 10.  Upon further questioning he has been having some difficulty. Based on his current data I did make changes today and placed him on a BiPAP total unit and reduce his EPAP minimum to 8 and [place] IPAP maximum of 18 with a pressure support of 4. Changes were also made in his medication to a level of 5 and if he continues to have significant dry mouth he is to increase this to 6. I have requested that  down will be obtained in 4 weeks and he may benefit from a heated hose for improved humidification.   Epworth Sleepiness Scale: Situation   Chance of Dozing/Sleeping (0 = never , 1 = slight chance , 2 = moderate chance , 3 = high chance )   sitting and reading 1   watching TV 3   sitting inactive in a public place 0   being a passenger in a motor vehicle for an hour or more 0   lying down in the afternoon 3   sitting and talking to someone 0   sitting quietly after lunch  (no alcohol) 3   while stopped for a few minutes in traffic as the driver 0   Total Score  10    Past Medical History  Diagnosis Date  . Heart attack 2007  . Hypertension   . OSA (obstructive sleep apnea)   . Hyperlipidemia   . CAD (coronary artery disease)     2D ECHO, 03/17/2010 - EF 45-50%, normal    Past Surgical History  Procedure Laterality Date  . Coronary angioplasty with stent placement  2010  . Eye surgery  1987  . Exercise stress test  01/18/2013    Small fixed inferolateral defect-artifact is favored. No ischemia  . Cardiac catheterization  04/01/2008    LAD ostium stented with a 2.5x70mm Promus DES stent reducing a 95% stenosis to 0%; Proximal intermediate Ramus stented with a 2.75x68mm Promus drug-eluting stent reducing a 85% stenosis to 0% residual  . Cardiac catheterization  03/26/2008    Increased medical management  . Cardiac catheterization  04/27/2006    CABG recommended  . Tee without cardioversion  04/29/2006  . Coronary artery bypass graft  04/29/2006    LIMA to LAD, SVG to first diagonal, SVG to ramus intermedius, SVG to PDA, and SVG to PLA branch of RCA    No Known Allergies  Current Outpatient Prescriptions  Medication Sig Dispense Refill  . amLODipine (NORVASC) 5 MG tablet Take 5 mg by mouth daily.      Marland Kitchen aspirin 81 MG tablet Take 81 mg by mouth daily.      . clopidogrel (PLAVIX) 75 MG tablet TAKE ONE TABLET BY MOUTH EVERY DAY  90 tablet  3  . ezetimibe (ZETIA) 10 MG tablet Take 10 mg by mouth daily.      . furosemide (LASIX) 40 MG tablet Take 1 & 1/2 tablet daily  45 tablet  11  . isosorbide mononitrate (IMDUR) 60 MG 24 hr tablet Take 1 tablet (60 mg total) by mouth daily.  90 tablet  2  . metoprolol (LOPRESSOR) 50 MG tablet Take 1 tablet (50 mg total) by mouth 2 (two) times daily.  60 tablet  11  . pantoprazole (PROTONIX) 40 MG tablet Take 30-60 min before first meal of the day      . potassium chloride SA (K-DUR,KLOR-CON) 20 MEQ tablet Take 1 tablet  (20 mEq total) by mouth daily.  30 tablet  3  . simvastatin (ZOCOR) 20 MG tablet Take 20 mg by mouth every evening.      . triamcinolone cream (KENALOG) 0.1 % Apply 1 application topically as needed.        No current facility-administered medications for this visit.    Socially he is married and has 5 children, 7 grandchildren 4 great current children. He does not exercise per  ROS negative for fever, chills or night sweats. He denies rash. He denies change in vision or  hearing. He did have difficulty with congestion. He also notes significant dry mouth. He denies wheezing. He denies PND or orthopnea. He denies recurrent anginal symptoms. He denies abdominal pain nausea vomiting. He is unaware of blood in stool you're pretty does note leg swelling particularly around his ankles bilaterally. He denies tremors. He denies restless legs. He is unaware of breakthrough snoring. He denies hypnagogic hallucinations. Other system review is negative.  PE BP 137/85  Pulse 71  Ht 5\' 7"  (1.702 m)  Wt 231 lb 8 oz (105.008 kg)  BMI 36.25 kg/m2  General: Alert, oriented, no distress.  Skin: normal turgor, no rashes HEENT: Normocephalic, atraumatic. Pupils round and reactive; sclera anicteric; extraocular muscles intact; Fundi without hemorrhages or exudate Nose without nasal septal hypertrophy Mouth/Parynx benign; Mallinpatti scale 3 Neck: No JVD, no carotid bruits with normal carotid up stroke Lungs: clear to ausculatation and percussion; no wheezing or rales  Chest wall: No tenderness to palpation Heart: RRR, s1 s2 normal 1/6 systolic murmur no diastolic murmur. Abdomen: soft, nontender; no hepatosplenomehaly, BS+; abdominal aorta nontender and not dilated by palpation. Back: No CVA tenderness Pulses 2+ Extremities: no clubbinbg cyanosis or edema, Homan's sign negative  Neurologic: grossly nonfocal; cranial nerves intact. Psychological: Normal affect and mood.  LABS:  BMET    Component Value  Date/Time   NA 143 07/12/2013 0935   NA 139 04/02/2008 0620   K 4.5 07/12/2013 0935   CL 103 07/12/2013 0935   CO2 25 07/12/2013 0935   GLUCOSE 98 07/12/2013 0935   GLUCOSE 100* 04/02/2008 0620   BUN 18 07/12/2013 0935   BUN 17 04/02/2008 0620   CREATININE 1.23 07/12/2013 0935   CALCIUM 9.6 07/12/2013 0935   GFRNONAA 57* 07/12/2013 0935   GFRAA 66 07/12/2013 0935     Hepatic Function Panel     Component Value Date/Time   PROT 6.8 07/12/2013 0935   AST 24 07/12/2013 0935   ALT 21 07/12/2013 0935   ALKPHOS 45 07/12/2013 0935   BILITOT 0.3 07/12/2013 0935     CBC    Component Value Date/Time   WBC 8.9 07/12/2013 0935   WBC 6.4 04/02/2008 0620   RBC 4.78 07/12/2013 0935   RBC 4.08* 04/02/2008 0620   HGB 13.9 07/12/2013 0935   HCT 41.8 07/12/2013 0935   PLT 268 07/12/2013 0935   MCV 87 07/12/2013 0935   MCH 29.1 07/12/2013 0935   MCHC 33.3 07/12/2013 0935   MCHC 34.7 04/02/2008 0620   RDW 13.8 07/12/2013 0935   RDW 13.7 04/02/2008 0620   LYMPHSABS 2.3 07/12/2013 0935   EOSABS 0.4 07/12/2013 0935   BASOSABS 0.0 07/12/2013 0935     BNP No results found for this basename: probnp    Lipid Panel     Component Value Date/Time   TRIG 210* 07/12/2013 0935   HDL 38* 07/12/2013 0935   LDLCALC 66 07/12/2013 0935     RADIOLOGY: No results found.    ASSESSMENT AND PLAN: Since I last saw Daniel Mays, he did [facility] but unfortunately this year they no longer cover his [employer]. He now has been with apnea. I will although he brought his machine with him today I was unable to download this. However, I did visually inspect his machine with data as noted above. He currently has an HIV 7.3 with an apnea index of 6.9 on his BiPAP fix pressure at 14/10. I have changed him to a BiPAP although unit  and have reduced his EPAP to 8 but have potentially given him the opportunity to increase to 18/14. Because of congestion and dry mouth, I did alter his humidification  setting. His hose is in disrepair. He may benefit from a heated humid and a new heated coil hose for improved communication. I did write a prescription of these changes today to be sent to preop. A new download will be obtained in 30 days. We discussed the importance of optimal medical compliance and meeting Medicare requirements. From a cardiac standpoint, he does have peripheral edema. I've recommended further titration of his Lasix to 60 mg in the morning. I'll see him in the office in several months for followup evaluation.     Lennette Bihari, MD, [facility]  01/08/2014 4:38 PM

## 2014-01-21 ENCOUNTER — Other Ambulatory Visit: Payer: Self-pay | Admitting: Cardiovascular Disease

## 2014-01-21 NOTE — Telephone Encounter (Signed)
Rx was sent to pharmacy electronically. 

## 2014-01-22 ENCOUNTER — Other Ambulatory Visit: Payer: Self-pay

## 2014-01-22 MED ORDER — AMLODIPINE BESYLATE 5 MG PO TABS: 5.0000 mg | ORAL_TABLET | Freq: Every day | ORAL | Status: DC

## 2014-01-22 NOTE — Telephone Encounter (Signed)
Rx was sent to pharmacy electronically. 

## 2014-02-04 ENCOUNTER — Other Ambulatory Visit: Payer: Self-pay | Admitting: Cardiovascular Disease

## 2014-02-04 NOTE — Telephone Encounter (Signed)
Rx refill sent to patient pharmacy   

## 2014-02-18 ENCOUNTER — Ambulatory Visit (INDEPENDENT_AMBULATORY_CARE_PROVIDER_SITE_OTHER): Payer: Commercial Managed Care - HMO | Admitting: Cardiology

## 2014-02-18 ENCOUNTER — Encounter: Payer: Self-pay | Admitting: Cardiology

## 2014-02-18 VITALS — BP 110/60 | HR 71 | Ht 67.0 in | Wt 230.0 lb

## 2014-02-18 DIAGNOSIS — R0989 Other specified symptoms and signs involving the circulatory and respiratory systems: Secondary | ICD-10-CM

## 2014-02-18 DIAGNOSIS — I2510 Atherosclerotic heart disease of native coronary artery without angina pectoris: Secondary | ICD-10-CM

## 2014-02-18 DIAGNOSIS — R0609 Other forms of dyspnea: Secondary | ICD-10-CM

## 2014-02-18 DIAGNOSIS — E785 Hyperlipidemia, unspecified: Secondary | ICD-10-CM

## 2014-02-18 DIAGNOSIS — R0600 Dyspnea, unspecified: Secondary | ICD-10-CM

## 2014-02-18 NOTE — Progress Notes (Signed)
Patient ID: Daniel Mays, male   DOB: 06/28/1941, 73 y.o.   MRN: [id number]    02/18/2014 Daniel Mays   06/29/1941  [id number]  Primary Physicia HEDRICK,JAMES, MD Primary Cardiologist: Dr. Tresa EndoKelly  Daniel Mays is a 73 year old male who presents to clinic for cardiac evaluation. He is followed by Dr. Tresa EndoKelly. Details of his past cardiovascular history along with history of present illness or outlined below.  HPI:  Daniel Mays has known coronary artery disease in June 2007 suffered an inferior wall ST segment elevation myocardial infarction which was complicated by recurrent episodes of ventricular fibrillation requiring numerous to defibrillations, CPR, intra-aortic balloon pump insertion and pacemaker therapy. At that time, Dr.Kelly performed successful PTCA of a totally occluded right coronary artery with restoration of TIMI-3 flow. Due to severe concomitant CAD, Dr. Tresa EndoKelly recommended elective CABG surgery which was done the following day by Dr. Dorris FetchHendrickson. He underwent CABG x5 with a LIMA to the LAD, a vein to the diagonal, vein to the intermediate, sequential vein to the PDA and PLA branch of the right coronary artery. In June 2009 he underwent 2 vessel intervention involving the LAD ostium and proximal intermediate vessel. Additional problems also include obstructive sleep apnea. He apparently has not used CPAP therapy for several years. He has a history of hyperlipidemia, obesity, renal artery stenosis.  In April 2014 he underwent a myocardial perfusion study which was low risk without significant scar or ischemia and only mild inferolateral thinning. Ejection fraction was 61%. He had normal wall motion. Dr. Tresa EndoKelly also follows him in sleep clinic for OSA.  He presents to clinic today for followup. He states that he has been doing fairly well but complains of fatigue and dyspnea when he attempts to lie in the supine position, which is concerning for possible orthopnea/PND. He states that he  has to sleep on 2 pillows to avoid symptoms. He denies any significant exertional symptoms and no lower extremity edema. He also denies chest pain. He reports daily compliance with his Lasix as well as all of his other home medications. Review of his medical records reveal that Dr. Tresa EndoKelly ordered a 2-D echocardiogram to be performed a year ago in June 2014. This was ordered as it had been over 3 years since his last 2-D echo. However, the patient has failed to followup for the study.    Current Outpatient Prescriptions  Medication Sig Dispense Refill  . amLODipine (NORVASC) 5 MG tablet Take 1 tablet (5 mg total) by mouth daily.  90 tablet  3  . aspirin 81 MG tablet Take 81 mg by mouth daily.      . clopidogrel (PLAVIX) 75 MG tablet TAKE ONE TABLET BY MOUTH EVERY DAY  90 tablet  3  . furosemide (LASIX) 40 MG tablet Take 1 & 1/2 tablet daily  45 tablet  11  . isosorbide mononitrate (IMDUR) 60 MG 24 hr tablet Take 1 tablet (60 mg total) by mouth daily.  90 tablet  2  . metoprolol (LOPRESSOR) 50 MG tablet Take 1 tablet (50 mg total) by mouth 2 (two) times daily.  60 tablet  11  . pantoprazole (PROTONIX) 40 MG tablet TAKE ONE TABLET BY MOUTH ONCE DAILY  90 tablet  3  . potassium chloride SA (K-DUR,KLOR-CON) 20 MEQ tablet Take 1 tablet (20 mEq total) by mouth daily.  30 tablet  3  . simvastatin (ZOCOR) 20 MG tablet TAKE ONE TABLET BY MOUTH AT BEDTIME  90 tablet  3  . ZETIA  10 MG tablet TAKE ONE TABLET BY MOUTH EVERY DAY  90 tablet  3   No current facility-administered medications for this visit.    No Known Allergies  History   Social History  . Marital Status: Married    Spouse Name: N/A    Number of Children: N/A  . Years of Education: N/A   Occupational History  . Retired Scientist, research (medical)    Social History Main Topics  . Smoking status: Former Smoker -- 5.00 packs/day for 55 years    Types: Cigarettes    Start date: 10/04/1946    Quit date: 10/04/2001  . Smokeless tobacco: Never  Used  . Alcohol Use: Yes     Comment: occ  . Drug Use: No  . Sexual Activity: Not on file   Other Topics Concern  . Not on file   Social History Narrative  . No narrative on file     Review of Systems: General: negative for chills, fever, night sweats or weight changes.  Cardiovascular: negative for chest pain, dyspnea on exertion, edema, orthopnea, palpitations, paroxysmal nocturnal dyspnea or shortness of breath Dermatological: negative for rash Respiratory: negative for cough or wheezing Urologic: negative for hematuria Abdominal: negative for nausea, vomiting, diarrhea, bright red blood per rectum, melena, or hematemesis Neurologic: negative for visual changes, syncope, or dizziness All other systems reviewed and are otherwise negative except as noted above.    Blood pressure 110/60, pulse 71, height 5\' 7"  (1.702 m), weight 230 lb (104.327 kg).  General appearance: alert, cooperative and no distress Neck: no carotid bruit and no JVD Lungs: clear to auscultation bilaterally Heart: regular rate and rhythm, S1, S2 normal, no murmur, click, rub or gallop Extremities: no LEE Pulses: 2+ and symmetric Skin: warm and dry Neurologic: Grossly normal  EKG NSR 71 pbm  ASSESSMENT AND PLAN:   Dyspnea Symptoms are concerning for orthopnea and PND. He denies any significant exertional symptoms. His last 2-D echo was in 2011. EF at that time was mildly reduced at 45-50%. Dr. Tresa Endo had ordered a repeat 2-D echo a year ago however the patient did not followup with this. In light of his recent symptoms, we will go ahead and repeat the study to reassess for changes in systolic function. For now, continue current dose of Lasix at 60 mg daily.  Coronary atherosclerosis of native coronary artery Stable .Denies any recent chest pain. EKG shows normal sinus rhythm with a heart rate of 71 beats per minute. There are no ischemic changes.  Hyperlipidemia LDL goal < 70 Continue simvastatin and  Zetia.    PLAN  Reassess systolic function with a 2-D echocardiogram. Continue current medical regimen. If systolic function is significantly reduced compared to prior study, we'll bring patient back to clinic for followup to discuss medication adjustments. If normal, he has been instructed to followup with Dr. Tresa Endo for his yearly comprehensive cardiovascular assessment.    Carlynn Leduc SimmonsPA-C 02/18/2014 6:47 PM

## 2014-02-18 NOTE — Assessment & Plan Note (Signed)
Symptoms are concerning for orthopnea and PND. He denies any significant exertional symptoms. His last 2-D echo was in 2011. EF at that time was mildly reduced at 45-50%. Dr. Tresa Endo had ordered a repeat 2-D echo a year ago however the patient did not followup with this. In light of his recent symptoms, we will go ahead and repeat the study to reassess for changes in systolic function. For now, continue current dose of Lasix at 60 mg daily.

## 2014-02-18 NOTE — Assessment & Plan Note (Signed)
Continue simvastatin and Zetia.

## 2014-02-18 NOTE — Assessment & Plan Note (Signed)
Stable .Denies any recent chest pain. EKG shows normal sinus rhythm with a heart rate of 71 beats per minute. There are no ischemic changes.

## 2014-02-18 NOTE — Patient Instructions (Signed)
1.Your physician recommends that you schedule a follow-up appointment in:  6 weeks with Dr. Tresa Endo  2. Continue to take your meds as you are now

## 2014-02-19 ENCOUNTER — Ambulatory Visit: Payer: Medicare HMO | Admitting: Cardiovascular Disease

## 2014-02-19 ENCOUNTER — Other Ambulatory Visit: Payer: Self-pay | Admitting: *Deleted

## 2014-02-19 NOTE — Addendum Note (Signed)
Addended by: Vincenza Hews. on: 02/19/2014 12:18 PM   Modules accepted: Orders

## 2014-02-27 ENCOUNTER — Ambulatory Visit (HOSPITAL_COMMUNITY)
Admission: RE | Admit: 2014-02-27 | Discharge: 2014-02-27 | Disposition: A | Discharge status: Home / Self Care | Payer: Medicare HMO | Source: Ambulatory Visit | Attending: Internal Medicine | Admitting: Internal Medicine

## 2014-02-27 DIAGNOSIS — Z951 Presence of aortocoronary bypass graft: Secondary | ICD-10-CM | POA: Insufficient documentation

## 2014-02-27 DIAGNOSIS — I2510 Atherosclerotic heart disease of native coronary artery without angina pectoris: Secondary | ICD-10-CM | POA: Insufficient documentation

## 2014-02-27 NOTE — Progress Notes (Signed)
2D Echocardiogram Complete.  02/27/2014   Idonia Zollinger, RDCS 

## 2014-03-21 ENCOUNTER — Ambulatory Visit (INDEPENDENT_AMBULATORY_CARE_PROVIDER_SITE_OTHER): Payer: Commercial Managed Care - HMO | Admitting: Cardiovascular Disease

## 2014-03-21 ENCOUNTER — Encounter: Payer: Self-pay | Admitting: Cardiovascular Disease

## 2014-03-21 VITALS — BP 118/64 | HR 68 | Ht 67.0 in | Wt 232.0 lb

## 2014-03-21 DIAGNOSIS — I10 Essential (primary) hypertension: Secondary | ICD-10-CM

## 2014-03-21 DIAGNOSIS — R5381 Other malaise: Secondary | ICD-10-CM

## 2014-03-21 DIAGNOSIS — E785 Hyperlipidemia, unspecified: Secondary | ICD-10-CM

## 2014-03-21 DIAGNOSIS — G4733 Obstructive sleep apnea (adult) (pediatric): Secondary | ICD-10-CM

## 2014-03-21 DIAGNOSIS — R5383 Other fatigue: Secondary | ICD-10-CM

## 2014-03-21 DIAGNOSIS — I2510 Atherosclerotic heart disease of native coronary artery without angina pectoris: Secondary | ICD-10-CM

## 2014-03-21 DIAGNOSIS — E669 Obesity, unspecified: Secondary | ICD-10-CM

## 2014-03-21 MED ORDER — LOSARTAN POTASSIUM 50 MG PO TABS: 50.0000 mg | ORAL_TABLET | Freq: Every day | ORAL | Status: DC

## 2014-03-21 NOTE — Patient Instructions (Addendum)
Your physician has recommended you make the following change in your medication: start new prescription for losartan.  This  has already been sent to the pharmacy. Stop the amlodipine.  Your physician recommends that you return for lab work fasting.  Your physician recommends that you schedule a follow-up appointment in: 3 months.

## 2014-03-22 ENCOUNTER — Other Ambulatory Visit: Payer: Self-pay | Admitting: Cardiovascular Disease

## 2014-03-22 NOTE — Telephone Encounter (Signed)
Rx refill sent to patient pharmacy   

## 2014-03-23 ENCOUNTER — Encounter: Payer: Self-pay | Admitting: Cardiovascular Disease

## 2014-03-23 NOTE — Progress Notes (Signed)
Patient ID: Daniel Mays, male   DOB: April 02, 1941, 73 y.o.   MRN: [id number]     HPI: Daniel Mays is a 73 y.o. male who presents to the office today for a follow up cardiology/sleep evaluation.   Daniel Mays has known coronary artery disease in June 2007 suffered an inferior wall ST segment elevation myocardial infarction which was complicated by recurrent episodes of ventricular fibrillation requiring numerous to defibrillations, CPR, intra-aortic balloon pump insertion and pacemaker therapy. At that time, I performed successful PTCA of a totally occluded right coronary artery with restoration of TIMI-3 flow. Due to severe concomitant CAD I recommended elective CABG surgery which was done the following day by Dr. Dorris Fetch. He underwent CABG x5 with a LIMA to the LAD, a vein to the diagonal, vein to the intermediate, sequential vein to the PDA and PLA branch of the right coronary artery. In June 2009 he underwent 2 vessel intervention involving the LAD ostium and proximal intermediate vessel. Additional problems also include obstructive sleep apnea. He apparently has not used CPAP therapy for several years. He has a history of hyperlipidemia, obesity, mild renal artery stenosis on duplex imaging.  In April 2014 he underwent a myocardial perfusion study which was low risk without significant scar or ischemia in only mild inferolateral thinning. Ejection fraction 61%. He had normal wall motion.  He has a history of complex sleep apnea, and had not been on CPAP therapy for several years. He  was referred for a follow-up sleep study on 07/20/2013. This was done in a split-night protocol due to severe sleep apnea. His overall AHI was 65.7 per hour. He was unable to reach REM sleep in the baseline portion of the study. Oxygen dropped to 86% during non-REM sleep and has evidence for loud snoring. He was titrated to 13 cm water pressure but due to development of significant central events, BiPAP was  started at 14/10 was increased to 15/11 cm. I last saw him, he had not yet been contacted by [facility] to initiate therapy but subsequently this has taken place. He apparently received an Airsense 10 ResMed unit on 10/26/2013.  When I last saw him, he was having difficulty in significant changes were made teamed.  I reduced his Z-Pak to 8 but he had the potential to increase to 18/14 if necessary.  We also changed him to a heated coil hose for improved humidification.  Subsequently, he has been sleeping significantly better.  He denies residual daytime sleepiness.  He is unaware of breakthrough snoring.  He does admit to significant leg swelling.  He denies recent chest pain.  An echo Doppler study was done on 02/27/2014, which showed an ejection fraction of 45-50% without wall motion abnormality.  Estimated pulmonary pressure was normal.     Past Medical History  Diagnosis Date  . Heart attack 2007  . Hypertension   . OSA (obstructive sleep apnea)   . Hyperlipidemia   . CAD (coronary artery disease)     2D ECHO, 03/17/2010 - EF 45-50%, normal    Past Surgical History  Procedure Laterality Date  . Coronary angioplasty with stent placement  2010  . Eye surgery  1987  . Exercise stress test  01/18/2013    Small fixed inferolateral defect-artifact is favored. No ischemia  . Cardiac catheterization  04/01/2008    LAD ostium stented with a 2.5x62mm Promus DES stent reducing a 95% stenosis to 0%; Proximal intermediate Ramus stented with a 2.75x76mm Promus drug-eluting stent  reducing a 85% stenosis to 0% residual  . Cardiac catheterization  03/26/2008    Increased medical management  . Cardiac catheterization  04/27/2006    CABG recommended  . Tee without cardioversion  04/29/2006  . Coronary artery bypass graft  04/29/2006    LIMA to LAD, SVG to first diagonal, SVG to ramus intermedius, SVG to PDA, and SVG to PLA branch of RCA    No Known Allergies  Current Outpatient Prescriptions    Medication Sig Dispense Refill  . aspirin 81 MG tablet Take 81 mg by mouth daily.      . clopidogrel (PLAVIX) 75 MG tablet TAKE ONE TABLET BY MOUTH EVERY DAY  90 tablet  3  . furosemide (LASIX) 40 MG tablet 40 mg daily. Takes 2 tablets      . isosorbide mononitrate (IMDUR) 60 MG 24 hr tablet Take 1 tablet (60 mg total) by mouth daily.  90 tablet  2  . pantoprazole (PROTONIX) 40 MG tablet TAKE ONE TABLET BY MOUTH ONCE DAILY  90 tablet  3  . potassium chloride SA (K-DUR,KLOR-CON) 20 MEQ tablet Take 1 tablet (20 mEq total) by mouth daily.  30 tablet  3  . simvastatin (ZOCOR) 20 MG tablet TAKE ONE TABLET BY MOUTH AT BEDTIME  90 tablet  3  . ZETIA 10 MG tablet TAKE ONE TABLET BY MOUTH EVERY DAY  90 tablet  3  . losartan (COZAAR) 50 MG tablet Take 1 tablet (50 mg total) by mouth daily.  30 tablet  6  . metoprolol (LOPRESSOR) 50 MG tablet TAKE ONE TABLET BY MOUTH TWICE DAILY  60 tablet  3   No current facility-administered medications for this visit.    History   Social History  . Marital Status: Married    Spouse Name: N/A    Number of Children: N/A  . Years of Education: N/A   Occupational History  . Retired Scientist, research (medical)Mechanic/House mover    Social History Main Topics  . Smoking status: Former Smoker -- 5.00 packs/day for 55 years    Types: Cigarettes    Start date: 10/04/1946    Quit date: 10/04/2001  . Smokeless tobacco: Never Used  . Alcohol Use: Yes     Comment: occ  . Drug Use: No  . Sexual Activity: Not on file   Other Topics Concern  . Not on file   Social History Narrative  . No narrative on file    Family History  Problem Relation Age of Onset  . Parkinson's disease Mother   . Diabetes Mother     ROS General: Negative; No fevers, chills, or night sweats HEENT: Negative; No changes in vision or hearing, sinus congestion, difficulty swallowing Pulmonary: Negative; No cough, wheezing, shortness of breath, hemoptysis Cardiovascular: See HPI: No chest pain, presyncope,  syncope, palpatations Positive for leg edema, which has improved, but still is present GI: Positive for GERD No nausea, vomiting, diarrhea, or abdominal pain GU: Negative; No dysuria, hematuria, or difficulty voiding Musculoskeletal: Negative; no myalgias, joint pain, or weakness Hematologic: Negative; no easy bruising, bleeding Endocrine: Negative; no heat/cold intolerance; no diabetes, Neuro: Negative; no changes in balance, headaches Skin: Negative; No rashes or skin lesions Psychiatric: Negative; No behavioral problems, depression Sleep: Positive for complex sleep apnea with current BiPAP settings no snoring,  daytime sleepiness, hypersomnolence, bruxism, restless legs, hypnogognic hallucinations. Other comprehensive 14 point system review is negative   Physical Exam BP 118/64  Pulse 68  Ht 5\' 7"  (1.702 m)  Wt 232  lb (105.235 kg)  BMI 36.33 kg/m2 General: Alert, oriented, no distress.  Skin: normal turgor, no rashes, warm and dry HEENT: Normocephalic, atraumatic. Pupils equal round and reactive to light; sclera anicteric; extraocular muscles intact, No lid lag; Nose without nasal septal hypertrophy; Mouth/Parynx benign; Mallinpatti scale 3/4 Neck: No JVD, no carotid bruits; normal carotid upstroke Lungs: clear to ausculatation and percussion bilaterally; no wheezing or rales, normal inspiratory and expiratory effort Chest wall: without tenderness to palpitation Heart: PMI not displaced, RRR, s1 s2 normal, 1/6systolic murmur, No diastolic murmur, no rubs, gallops, thrills, or heaves Abdomen: soft, nontender; no hepatosplenomehaly, BS+; abdominal aorta nontender and not dilated by palpation. Back: no CVA tenderness Pulses: 2+  Musculoskeletal: full range of motion, normal strength, no joint deformities Extremities: 1+ ankle edema Pulses 2+, no clubbing cyanosis, Homan's sign negative  Neurologic: grossly nonfocal; Cranial nerves grossly wnl Psychologic: Normal mood and  affect   ECG (independently read by me): Normal sinus rhythm at 68 beats per minute.  Mild first degree AV block with PR interval 208 ms.  Nonspecific ST-T change  LABS:  BMET    Component Value Date/Time   NA 143 07/12/2013 0935   NA 139 04/02/2008 0620   K 4.5 07/12/2013 0935   CL 103 07/12/2013 0935   CO2 25 07/12/2013 0935   GLUCOSE 98 07/12/2013 0935   GLUCOSE 100* 04/02/2008 0620   BUN 18 07/12/2013 0935   BUN 17 04/02/2008 0620   CREATININE 1.23 07/12/2013 0935   CALCIUM 9.6 07/12/2013 0935   GFRNONAA 57* 07/12/2013 0935   GFRAA 66 07/12/2013 0935     Hepatic Function Panel     Component Value Date/Time   PROT 6.8 07/12/2013 0935   AST 24 07/12/2013 0935   ALT 21 07/12/2013 0935   ALKPHOS 45 07/12/2013 0935   BILITOT 0.3 07/12/2013 0935     CBC    Component Value Date/Time   WBC 8.9 07/12/2013 0935   WBC 6.4 04/02/2008 0620   RBC 4.78 07/12/2013 0935   RBC 4.08* 04/02/2008 0620   HGB 13.9 07/12/2013 0935   HCT 41.8 07/12/2013 0935   PLT 268 07/12/2013 0935   MCV 87 07/12/2013 0935   MCH 29.1 07/12/2013 0935   MCHC 33.3 07/12/2013 0935   MCHC 34.7 04/02/2008 0620   RDW 13.8 07/12/2013 0935   RDW 13.7 04/02/2008 0620   LYMPHSABS 2.3 07/12/2013 0935   EOSABS 0.4 07/12/2013 0935   BASOSABS 0.0 07/12/2013 0935     BNP No results found for this basename: probnp    Lipid Panel     Component Value Date/Time   TRIG 210* 07/12/2013 0935   HDL 38* 07/12/2013 0935   LDLCALC 66 07/12/2013 0935     RADIOLOGY: No results found.    ASSESSMENT AND PLAN: Mr. Grahn is a 73 year old gentleman status post remote MI and cardiac arrest, requiring numerous defibrillations for ventricular fibrillation, CPR, and turned blue pump insertion, pacemaker therapy in 2007.  He is status post CABG revascularization surgery following successful PTCA to his totally occluded RCA with resumption of TIMI-3 flow .  For myocardial salvage.  Prior to his current CABG surgery.  Ejection fraction is 45-50%.   He continues to have significant leg swelling, which has improved on his current dose of amlodipine.  Presently, I am recommending he discontinue amlodipine and in its place I will start him on losartan 50 mg with need for renal function followup.  His blood pressure today is controlled.  He is not having anginal symptoms.  He is sleeping significantly better with reference to his complex sleep apnea on his current BiPAP therapy and with his heated hose for improved communication.  He continues to be a conduit, and a platelet therapy without bleeding issues.  He is on simvastatin 20 mg for hyperlipidemia in addition to Zetia 10 mg. I am recommending followup laboratory be obtained.  I will see him in 3 months for cardiology reevaluation.  Lennette Bihari, MD, [facility]  03/23/2014 11:37 AM

## 2014-04-10 ENCOUNTER — Other Ambulatory Visit (HOSPITAL_COMMUNITY): Payer: Self-pay | Admitting: Cardiovascular Disease

## 2014-04-10 DIAGNOSIS — I25709 Atherosclerosis of coronary artery bypass graft(s), unspecified, with unspecified angina pectoris: Secondary | ICD-10-CM

## 2014-04-17 ENCOUNTER — Ambulatory Visit (HOSPITAL_COMMUNITY)
Admission: RE | Admit: 2014-04-17 | Discharge: 2014-04-17 | Disposition: A | Discharge status: Home / Self Care | Payer: Medicare HMO | Source: Ambulatory Visit | Attending: Internal Medicine | Admitting: Internal Medicine

## 2014-04-17 DIAGNOSIS — I25810 Atherosclerosis of coronary artery bypass graft(s) without angina pectoris: Secondary | ICD-10-CM | POA: Insufficient documentation

## 2014-04-17 DIAGNOSIS — I209 Angina pectoris, unspecified: Secondary | ICD-10-CM | POA: Insufficient documentation

## 2014-04-17 DIAGNOSIS — I701 Atherosclerosis of renal artery: Secondary | ICD-10-CM

## 2014-04-17 DIAGNOSIS — I25709 Atherosclerosis of coronary artery bypass graft(s), unspecified, with unspecified angina pectoris: Secondary | ICD-10-CM

## 2014-04-17 LAB — CBC
HCT: 42.7 % (ref 39.0–52.0)
Hemoglobin: 14.5 g/dL (ref 13.0–17.0)
MCH: 29.1 pg (ref 26.0–34.0)
MCHC: 34 g/dL (ref 30.0–36.0)
MCV: 85.6 fL (ref 78.0–100.0)
Platelets: 282 10*3/uL (ref 150–400)
RBC: 4.99 MIL/uL (ref 4.22–5.81)
RDW: 14.1 % (ref 11.5–15.5)
WBC: 7.3 10*3/uL (ref 4.0–10.5)

## 2014-04-17 LAB — COMPREHENSIVE METABOLIC PANEL
ALK PHOS: 47 U/L (ref 39–117)
ALT: 23 U/L (ref 0–53)
AST: 23 U/L (ref 0–37)
Albumin: 4.5 g/dL (ref 3.5–5.2)
BILIRUBIN TOTAL: 0.4 mg/dL (ref 0.2–1.2)
BUN: 22 mg/dL (ref 6–23)
CO2: 29 mEq/L (ref 19–32)
CREATININE: 1.39 mg/dL — AB (ref 0.50–1.35)
Calcium: 9.6 mg/dL (ref 8.4–10.5)
Chloride: 105 mEq/L (ref 96–112)
Glucose, Bld: 92 mg/dL (ref 70–99)
Potassium: 4.7 mEq/L (ref 3.5–5.3)
Sodium: 143 mEq/L (ref 135–145)
Total Protein: 7 g/dL (ref 6.0–8.3)

## 2014-04-17 LAB — TSH: TSH: 1.357 u[IU]/mL (ref 0.350–4.500)

## 2014-04-17 LAB — LIPID PANEL
CHOL/HDL RATIO: 3.7 ratio
Cholesterol: 114 mg/dL (ref 0–200)
HDL: 31 mg/dL — ABNORMAL LOW (ref >39)
LDL Cholesterol: 46 mg/dL (ref 0–99)
Triglycerides: 184 mg/dL — ABNORMAL HIGH (ref <150)
VLDL: 37 mg/dL (ref 0–40)

## 2014-04-17 NOTE — Progress Notes (Signed)
Renal Artery Duplex Completed. °Brianna L Mazza,RVT °

## 2014-05-20 ENCOUNTER — Other Ambulatory Visit: Payer: Self-pay | Admitting: Cardiovascular Disease

## 2014-05-20 ENCOUNTER — Telehealth: Payer: Self-pay | Admitting: *Deleted

## 2014-05-20 NOTE — Telephone Encounter (Signed)
Faxed CPAP supply order to [facility]. 

## 2014-05-21 NOTE — Telephone Encounter (Signed)
Rx was sent to pharmacy electronically. 

## 2014-07-28 ENCOUNTER — Encounter (HOSPITAL_COMMUNITY): Payer: Self-pay | Admitting: Emergency Medicine

## 2014-07-28 ENCOUNTER — Observation Stay (HOSPITAL_COMMUNITY)
Admission: EM | Admit: 2014-07-28 | Discharge: 2014-07-30 | Disposition: A | Discharge status: Home / Self Care | Payer: Commercial Managed Care - HMO | Attending: Internal Medicine | Admitting: Internal Medicine

## 2014-07-28 ENCOUNTER — Emergency Department (HOSPITAL_COMMUNITY): Payer: Commercial Managed Care - HMO

## 2014-07-28 DIAGNOSIS — R05 Cough: Principal | ICD-10-CM | POA: Insufficient documentation

## 2014-07-28 DIAGNOSIS — Z9861 Coronary angioplasty status: Secondary | ICD-10-CM | POA: Insufficient documentation

## 2014-07-28 DIAGNOSIS — R079 Chest pain, unspecified: Secondary | ICD-10-CM | POA: Diagnosis present

## 2014-07-28 DIAGNOSIS — E785 Hyperlipidemia, unspecified: Secondary | ICD-10-CM

## 2014-07-28 DIAGNOSIS — I10 Essential (primary) hypertension: Secondary | ICD-10-CM

## 2014-07-28 DIAGNOSIS — Z79899 Other long term (current) drug therapy: Secondary | ICD-10-CM | POA: Diagnosis not present

## 2014-07-28 DIAGNOSIS — Z7982 Long term (current) use of aspirin: Secondary | ICD-10-CM | POA: Insufficient documentation

## 2014-07-28 DIAGNOSIS — I213 ST elevation (STEMI) myocardial infarction of unspecified site: Secondary | ICD-10-CM | POA: Diagnosis not present

## 2014-07-28 DIAGNOSIS — R42 Dizziness and giddiness: Secondary | ICD-10-CM | POA: Diagnosis not present

## 2014-07-28 DIAGNOSIS — Z9889 Other specified postprocedural states: Secondary | ICD-10-CM | POA: Diagnosis not present

## 2014-07-28 DIAGNOSIS — G4733 Obstructive sleep apnea (adult) (pediatric): Secondary | ICD-10-CM

## 2014-07-28 DIAGNOSIS — R059 Cough, unspecified: Secondary | ICD-10-CM

## 2014-07-28 DIAGNOSIS — R200 Anesthesia of skin: Secondary | ICD-10-CM | POA: Diagnosis not present

## 2014-07-28 DIAGNOSIS — I2510 Atherosclerotic heart disease of native coronary artery without angina pectoris: Secondary | ICD-10-CM | POA: Diagnosis not present

## 2014-07-28 DIAGNOSIS — Z8679 Personal history of other diseases of the circulatory system: Secondary | ICD-10-CM | POA: Insufficient documentation

## 2014-07-28 DIAGNOSIS — G458 Other transient cerebral ischemic attacks and related syndromes: Secondary | ICD-10-CM

## 2014-07-28 DIAGNOSIS — G459 Transient cerebral ischemic attack, unspecified: Secondary | ICD-10-CM

## 2014-07-28 DIAGNOSIS — Z87891 Personal history of nicotine dependence: Secondary | ICD-10-CM | POA: Insufficient documentation

## 2014-07-28 DIAGNOSIS — Z7902 Long term (current) use of antithrombotics/antiplatelets: Secondary | ICD-10-CM | POA: Diagnosis not present

## 2014-07-28 DIAGNOSIS — R0600 Dyspnea, unspecified: Secondary | ICD-10-CM

## 2014-07-28 DIAGNOSIS — I639 Cerebral infarction, unspecified: Secondary | ICD-10-CM

## 2014-07-28 LAB — DIFFERENTIAL
Basophils Absolute: 0 10*3/uL (ref 0.0–0.1)
Basophils Relative: 0 % (ref 0–1)
Eosinophils Absolute: 0.5 10*3/uL (ref 0.0–0.7)
Eosinophils Relative: 6 % — ABNORMAL HIGH (ref 0–5)
LYMPHS ABS: 2 10*3/uL (ref 0.7–4.0)
LYMPHS PCT: 23 % (ref 12–46)
Monocytes Absolute: 0.7 10*3/uL (ref 0.1–1.0)
Monocytes Relative: 9 % (ref 3–12)
Neutro Abs: 5.4 10*3/uL (ref 1.7–7.7)
Neutrophils Relative %: 62 % (ref 43–77)

## 2014-07-28 LAB — COMPREHENSIVE METABOLIC PANEL
ALT: 24 U/L (ref 0–53)
AST: 27 U/L (ref 0–37)
Albumin: 4.1 g/dL (ref 3.5–5.2)
Alkaline Phosphatase: 44 U/L (ref 39–117)
Anion gap: 12 (ref 5–15)
BILIRUBIN TOTAL: 0.3 mg/dL (ref 0.3–1.2)
BUN: 20 mg/dL (ref 6–23)
CALCIUM: 9.3 mg/dL (ref 8.4–10.5)
CHLORIDE: 102 meq/L (ref 96–112)
CO2: 27 meq/L (ref 19–32)
Creatinine, Ser: 1.36 mg/dL — ABNORMAL HIGH (ref 0.50–1.35)
GFR, EST AFRICAN AMERICAN: 57 mL/min — AB (ref >90)
GFR, EST NON AFRICAN AMERICAN: 49 mL/min — AB (ref >90)
Glucose, Bld: 150 mg/dL — ABNORMAL HIGH (ref 70–99)
Potassium: 3.8 mEq/L (ref 3.7–5.3)
SODIUM: 141 meq/L (ref 137–147)
Total Protein: 7.7 g/dL (ref 6.0–8.3)

## 2014-07-28 LAB — CBC
HEMATOCRIT: 41.5 % (ref 39.0–52.0)
Hemoglobin: 13.7 g/dL (ref 13.0–17.0)
MCH: 29.4 pg (ref 26.0–34.0)
MCHC: 33 g/dL (ref 30.0–36.0)
MCV: 89.1 fL (ref 78.0–100.0)
PLATELETS: 278 10*3/uL (ref 150–400)
RBC: 4.66 MIL/uL (ref 4.22–5.81)
RDW: 13.6 % (ref 11.5–15.5)
WBC: 8.6 10*3/uL (ref 4.0–10.5)

## 2014-07-28 LAB — PROTIME-INR
INR: 0.94 (ref 0.00–1.49)
PROTHROMBIN TIME: 12.6 s (ref 11.6–15.2)

## 2014-07-28 LAB — I-STAT TROPONIN, ED: Troponin i, poc: 0.01 ng/mL (ref 0.00–0.08)

## 2014-07-28 LAB — CBG MONITORING, ED: Glucose-Capillary: 134 mg/dL — ABNORMAL HIGH (ref 70–99)

## 2014-07-28 LAB — APTT: aPTT: 29 seconds (ref 24–37)

## 2014-07-28 MED ORDER — ACETAMINOPHEN 325 MG PO TABS
650.0000 mg | ORAL_TABLET | Freq: Once | ORAL | Status: AC
  Administered 2014-07-28: 650 mg via ORAL
  Filled 2014-07-28: qty 2

## 2014-07-28 MED ORDER — ASPIRIN EC 81 MG PO TBEC
81.0000 mg | DELAYED_RELEASE_TABLET | Freq: Every day | ORAL | Status: DC
  Administered 2014-07-28: 81 mg via ORAL
  Filled 2014-07-28 (×2): qty 1

## 2014-07-28 NOTE — ED Notes (Addendum)
Transporting patient to new room assignment. 

## 2014-07-28 NOTE — ED Notes (Signed)
Dr. Doutova at bedside.  

## 2014-07-28 NOTE — ED Notes (Signed)
Attempted to call report. Floor RN unable to accept report x 2 

## 2014-07-28 NOTE — ED Notes (Signed)
Attempted to call report. Floor RN unable to accept report.  

## 2014-07-28 NOTE — H&P (Signed)
PCP: Jerl MinaHEDRICK,JAMES, MD  Cardiology [facility]  Chief Complaint:  slurred speech,   HPI: Daniel PotashWilliam G Mays is a 73 y.o. male   has a past medical history of Heart attack (2007); Hypertension; OSA (obstructive sleep apnea); Hyperlipidemia; and CAD (coronary artery disease).   Presented with  Patient was working on his Motor today and states he felt dizzy "like he was on a ship" At 1 pm his family found him sitting on the porch with left facial droop Left arm weakness and slurred speech. Patient states he has ad some cough and some chest pain with coughing after he has fallen in the shower.  Patient was seen by Neurology in ER for presumed TIA.  In 2007 he had STEMI requiring recurrent episodes of ventricular fibrillation requiring numerous to defibrillations, CPR, intra-aortic balloon pump insertion and pacemaker therapy. Sp CABG x5 with a LIMA to the LAD, a vein to the diagonal, vein to the intermediate, sequential vein to the PDA and PLA branch of the right coronary artery. In June 2009 he underwent 2 vessel intervention involving the LAD ostium and proximal intermediate vessel.   Hospitalist was called for admission for TIA  Review of Systems:    Pertinent positives include: chest pain dyspnea on exertion,  non-productive cough,   localizing neurological complaints,   weakness, slurred speech,  Constitutional:  No weight loss, night sweats, Fevers, chills, fatigue, weight loss  HEENT:  No headaches, Difficulty swallowing,Tooth/dental problems,Sore throat,  No sneezing, itching, ear ache, nasal congestion, post nasal drip,  Cardio-vascular:  No , Orthopnea, PND, anasarca, dizziness, palpitations.no Bilateral lower extremity swelling  GI:  No heartburn, indigestion, abdominal pain, nausea, vomiting, diarrhea, change in bowel habits, loss of appetite, melena, blood in stool, hematemesis Resp:  no shortness of breath at rest. NoNo excess mucus, no productive cough, NoNo coughing  up of blood.No change in color of mucus.No wheezing. Skin:  no rash or lesions. No jaundice GU:  no dysuria, change in color of urine, no urgency or frequency. No straining to urinate.  No flank pain.  Musculoskeletal:  No joint pain or no joint swelling. No decreased range of motion. No back pain.  Psych:  No change in mood or affect. No depression or anxiety. No memory loss.  Neuro:  no double vision, no gait abnormality, no  no confusion  Otherwise ROS are negative except for above, 10 systems were reviewed  Past Medical History: Past Medical History  Diagnosis Date  . Heart attack 2007  . Hypertension   . OSA (obstructive sleep apnea)   . Hyperlipidemia   . CAD (coronary artery disease)     2D ECHO, 03/17/2010 - EF 45-50%, normal   Past Surgical History  Procedure Laterality Date  . Coronary angioplasty with stent placement  2010  . Eye surgery  1987  . Exercise stress test  01/18/2013    Small fixed inferolateral defect-artifact is favored. No ischemia  . Cardiac catheterization  04/01/2008    LAD ostium stented with a 2.5x5912mm Promus DES stent reducing a 95% stenosis to 0%; Proximal intermediate Ramus stented with a 2.75x2812mm Promus drug-eluting stent reducing a 85% stenosis to 0% residual  . Cardiac catheterization  03/26/2008    Increased medical management  . Cardiac catheterization  04/27/2006    CABG recommended  . Tee without cardioversion  04/29/2006  . Coronary artery bypass graft  04/29/2006    LIMA to LAD, SVG to first diagonal, SVG to ramus intermedius, SVG to PDA, and  SVG to PLA branch of RCA     Medications: Prior to Admission medications   Medication Sig Start Date End Date Taking? Authorizing Provider  acetaminophen (TYLENOL) 500 MG tablet Take 500 mg by mouth 3 (three) times daily.   Yes Historical Provider, MD  aspirin EC 81 MG tablet Take 81 mg by mouth daily.   Yes Historical Provider, MD  clopidogrel (PLAVIX) 75 MG tablet Take 75 mg by mouth daily.    Yes Historical Provider, MD  ezetimibe (ZETIA) 10 MG tablet Take 10 mg by mouth at bedtime.   Yes Historical Provider, MD  furosemide (LASIX) 40 MG tablet Take 40 mg by mouth daily. Takes 2 tablets 01/08/14  Yes Lennette Bihari, MD  isosorbide mononitrate (IMDUR) 60 MG 24 hr tablet Take 1 tablet (60 mg total) by mouth daily. 12/07/13  Yes Lennette Bihari, MD  losartan (COZAAR) 50 MG tablet Take 1 tablet (50 mg total) by mouth daily. 03/21/14  Yes Lennette Bihari, MD  metoprolol (LOPRESSOR) 50 MG tablet Take 50 mg by mouth 2 (two) times daily.   Yes Historical Provider, MD  pantoprazole (PROTONIX) 40 MG tablet Take 40 mg by mouth daily.   Yes Historical Provider, MD  potassium chloride SA (K-DUR,KLOR-CON) 20 MEQ tablet Take 10-20 mEq by mouth at bedtime.   Yes Historical Provider, MD  simvastatin (ZOCOR) 20 MG tablet Take 20 mg by mouth at bedtime.   Yes Historical Provider, MD    Allergies:  No Known Allergies  Social History:  Ambulatory   independently   Lives at home  With family     reports that he quit smoking about 12 years ago. His smoking use included Cigarettes. He started smoking about 67 years ago. He has a 275 pack-year smoking history. He has never used smokeless tobacco. He reports that he drinks alcohol. He reports that he does not use illicit drugs.    Family History: family history includes Diabetes in his mother; Parkinson's disease in his mother.    Physical Exam: Patient Vitals for the past 24 hrs:  BP Temp Temp src Pulse Resp SpO2  07/28/14 2230 151/78 mmHg 98.6 F (37 C) Oral 68 18 94 %  07/28/14 2207 165/82 mmHg - - 67 21 94 %  07/28/14 2200 165/82 mmHg - - 67 18 93 %  07/28/14 2145 150/82 mmHg - - 67 17 93 %  07/28/14 2130 170/74 mmHg - - 69 22 95 %  07/28/14 2115 173/77 mmHg - - 65 13 93 %  07/28/14 2105 179/91 mmHg - - 64 18 95 %  07/28/14 2100 164/80 mmHg - - 65 21 97 %  07/28/14 2045 170/85 mmHg - - 64 18 95 %  07/28/14 2030 177/80 mmHg - - 63 13 93 %    07/28/14 2015 165/83 mmHg - - 67 13 95 %  07/28/14 2000 159/80 mmHg - - 59 17 93 %  07/28/14 1945 159/90 mmHg - - 63 14 93 %  07/28/14 1930 162/80 mmHg - - 65 19 92 %  07/28/14 1917 146/72 mmHg - - 68 - 93 %  07/28/14 1845 167/84 mmHg - - 67 18 92 %  07/28/14 1815 134/72 mmHg - - 70 17 93 %  07/28/14 1800 153/73 mmHg - - 67 18 92 %  07/28/14 1745 133/79 mmHg - - 71 20 93 %  07/28/14 1732 - 97.8 F (36.6 C) - - - -  07/28/14 1730 134/77 mmHg - - 65 18  93 %  07/28/14 1723 141/82 mmHg 97.8 F (36.6 C) Oral 64 19 92 %  07/28/14 1655 141/82 mmHg 97.8 F (36.6 C) Oral 74 16 95 %    1. General:  in No Acute distress 2. Psychological: Alert and  Oriented 3. Head/ENT:   Moist   Mucous Membranes                          Head Non traumatic, neck supple                          adentoulous 4. SKIN:   decreased Skin turgor,  Skin clean Dry and intact no rash 5. Heart: Regular rate and rhythm no Murmur, Rub or gallop 6. Lungs: occasional mild wheezes no crackles   7. Abdomen: Soft, non-tender, Non distended 8. Lower extremities: no clubbing, cyanosis, or edema 9. Neurologically Strength intact, CN 2-12 intact 10. MSK: Normal range of motion  body mass index is unknown because there is no weight on file.   Labs on Admission:   Results for orders placed during the hospital encounter of 07/28/14 (from the past 24 hour(s))  PROTIME-INR     Status: None   Collection Time    07/28/14  4:52 PM      Result Value Ref Range   Prothrombin Time 12.6  11.6 - 15.2 seconds   INR 0.94  0.00 - 1.49  APTT     Status: None   Collection Time    07/28/14  4:52 PM      Result Value Ref Range   aPTT 29  24 - 37 seconds  CBC     Status: None   Collection Time    07/28/14  4:52 PM      Result Value Ref Range   WBC 8.6  4.0 - 10.5 K/uL   RBC 4.66  4.22 - 5.81 MIL/uL   Hemoglobin 13.7  13.0 - 17.0 g/dL   HCT 41.3  24.4 - 01.0 %   MCV 89.1  78.0 - 100.0 fL   MCH 29.4  26.0 - 34.0 pg   MCHC 33.0   30.0 - 36.0 g/dL   RDW 27.2  53.6 - 64.4 %   Platelets 278  150 - 400 K/uL  DIFFERENTIAL     Status: Abnormal   Collection Time    07/28/14  4:52 PM      Result Value Ref Range   Neutrophils Relative % 62  43 - 77 %   Neutro Abs 5.4  1.7 - 7.7 K/uL   Lymphocytes Relative 23  12 - 46 %   Lymphs Abs 2.0  0.7 - 4.0 K/uL   Monocytes Relative 9  3 - 12 %   Monocytes Absolute 0.7  0.1 - 1.0 K/uL   Eosinophils Relative 6 (*) 0 - 5 %   Eosinophils Absolute 0.5  0.0 - 0.7 K/uL   Basophils Relative 0  0 - 1 %   Basophils Absolute 0.0  0.0 - 0.1 K/uL  COMPREHENSIVE METABOLIC PANEL     Status: Abnormal   Collection Time    07/28/14  4:52 PM      Result Value Ref Range   Sodium 141  137 - 147 mEq/L   Potassium 3.8  3.7 - 5.3 mEq/L   Chloride 102  96 - 112 mEq/L   CO2 27  19 - 32 mEq/L   Glucose, Bld  150 (*) 70 - 99 mg/dL   BUN 20  6 - 23 mg/dL   Creatinine, Ser 7.01 (*) 0.50 - 1.35 mg/dL   Calcium 9.3  8.4 - 77.9 mg/dL   Total Protein 7.7  6.0 - 8.3 g/dL   Albumin 4.1  3.5 - 5.2 g/dL   AST 27  0 - 37 U/L   ALT 24  0 - 53 U/L   Alkaline Phosphatase 44  39 - 117 U/L   Total Bilirubin 0.3  0.3 - 1.2 mg/dL   GFR calc non Af Amer 49 (*) >90 mL/min   GFR calc Af Amer 57 (*) >90 mL/min   Anion gap 12  5 - 15  I-STAT TROPOININ, ED     Status: None   Collection Time    07/28/14  5:02 PM      Result Value Ref Range   Troponin i, poc 0.01  0.00 - 0.08 ng/mL   Comment 3           CBG MONITORING, ED     Status: Abnormal   Collection Time    07/28/14  5:26 PM      Result Value Ref Range   Glucose-Capillary 134 (*) 70 - 99 mg/dL      No results found for this basename: HGBA1C    The CrCl is unknown because both a height and weight (above a minimum accepted value) are required for this calculation.  BNP (last 3 results) No results found for this basename: PROBNP,  in the last 8760 hours  Other results:  I have pearsonaly reviewed this: ECG REPORT  Rate: 76  Rhythm: NSR left  posterior fascicular block ST&T Change: no ischemic changes  There were no vitals filed for this visit.  Cultures: No results found for this basename: sdes, specrequest, cult, reptstatus     Radiological Exams on Admission: Dg Chest 2 View  07/28/2014   CLINICAL DATA:  Status post fall 07/22/2014.  EXAM: CHEST  2 VIEW  COMPARISON:  PA and lateral chest 06/01/2012.  FINDINGS: The lungs are clear. Heart size is normal. No pneumothorax or pleural effusion. The patient is status post CABG.  IMPRESSION: No acute disease.   Electronically Signed   By: Drusilla Kanner M.D.   On: 07/28/2014 19:17   Ct Head (brain) Wo Contrast  07/28/2014   CLINICAL DATA:  Right-sided weakness and dizziness. History of prior infarct.  EXAM: CT HEAD WITHOUT CONTRAST  TECHNIQUE: Contiguous axial images were obtained from the base of the skull through the vertex without intravenous contrast.  COMPARISON:  None.  FINDINGS: The brain is atrophic with chronic microvascular ischemic change. Encephalomalacia in the right temporal lobe is identified consistent with prior infarct. No evidence of acute infarction, hemorrhage, mass lesion, mass effect, midline shift or abnormal extra-axial fluid collection is identified. There is no hydrocephalus or pneumocephalus. The calvarium is intact. Imaged paranasal sinuses and mastoid air cells are clear.  IMPRESSION: No acute abnormality.  Atrophy, chronic microvascular ischemic change and remote right temporal lobe infarct.   Electronically Signed   By: Drusilla Kanner M.D.   On: 07/28/2014 17:43    Chart has been reviewed  Assessment/Plan  73 yo M with transient left side weakness currently resolved. Hx of CAD, HTN   Present on Admission:  . TIA (transient ischemic attack) -   will admit based on TIA/CVA protocol, await results of MRA/MRI, Carotid Doppler and Echo, obtain cardiac enzymes,  ECG,   Lipid panel,  TSH. Order PT/OT evaluation. Will make sure patient is on antiplatelet  agent.   Neurology consult.     . Coronary atherosclerosis of native coronary artery - stable continue home medicatins . Sleep apnea, obstructive -refused CPAP HTN - Stable continue home medications  Prophylaxis: SCD , Protonix  CODE STATUS:   FULL CODE  Other plan as per orders.  I have spent a total of 55 min on this admission  Daniel Mays 07/28/2014, 11:01 PM  [facility]  Pager [phone]   after 2 AM please page floor coverage PA If 7AM-7PM, please contact the day team taking care of the patient  [email or web address]  Password [email or web address]

## 2014-07-28 NOTE — Consult Note (Signed)
Referring Physician: Domenick Bookbinder    Chief Complaint: dysarthria, left face weakness, confusion  HPI:                                                                                                                                         Daniel Mays is an 73 y.o. male with a past medical history significant for HTN, hyperlipidemia, CAD s/p CABG and stenting, OSA, brought in by family for further evaluation of the above stated symptoms.  Family is at the bedside and stated that when they came back form church around 1 pm they found him sitting in the porch and they noticed that he was slurring his words and had left face weakness and was also drooling.L arm was shaking some and mild confusion was also observed, Family thinks he was insecure walking because today he did not feel confident going to the Sunday market. Overall, he is improving. He complains of a slight HA but denies vertigo, double vision, difficulty swallowing, focal arm/leg weakness, or visual impairment. CT brain showed no acute abnormality. Takes aspirin and plavix daily.  Date last known well: 07/28/14 Time last known well: uncertain tPA Given: no, late presentation, symptoms resolving.   Past Medical History  Diagnosis Date  . Heart attack 2007  . Hypertension   . OSA (obstructive sleep apnea)   . Hyperlipidemia   . CAD (coronary artery disease)     2D ECHO, 03/17/2010 - EF 45-50%, normal    Past Surgical History  Procedure Laterality Date  . Coronary angioplasty with stent placement  2010  . Eye surgery  1987  . Exercise stress test  01/18/2013    Small fixed inferolateral defect-artifact is favored. No ischemia  . Cardiac catheterization  04/01/2008    LAD ostium stented with a 2.5x61m Promus DES stent reducing a 95% stenosis to 0%; Proximal intermediate Ramus stented with a [id number] drug-eluting stent reducing a 85% stenosis to 0% residual  . Cardiac catheterization  03/26/2008    Increased medical  management  . Cardiac catheterization  04/27/2006    CABG recommended  . Tee without cardioversion  04/29/2006  . Coronary artery bypass graft  04/29/2006    LIMA to LAD, SVG to first diagonal, SVG to ramus intermedius, SVG to PDA, and SVG to PLA branch of RCA    Family History  Problem Relation Age of Onset  . Parkinson's disease Mother   . Diabetes Mother    Social History:  reports that he quit smoking about 12 years ago. His smoking use included Cigarettes. He started smoking about 67 years ago. He has a 275 pack-year smoking history. He has never used smokeless tobacco. He reports that he drinks alcohol. He reports that he does not use illicit drugs.  Allergies: No Known Allergies  Medications:  I have reviewed the patient's current medications.  ROS:                                                                                                                                       History obtained from the patient, family, and chart review.  General ROS: negative for - chills, fatigue, fever, night sweats, or weight loss Psychological ROS: negative for - behavioral disorder, hallucinations, memory difficulties, mood swings or suicidal ideation Ophthalmic ROS: negative for - blurry vision, double vision, eye pain or loss of vision ENT ROS: negative for - epistaxis, nasal discharge, oral lesions, sore throat, tinnitus or vertigo Allergy and Immunology ROS: negative for - hives or itchy/watery eyes Hematological and Lymphatic ROS: negative for - bleeding problems, bruising or swollen lymph nodes Endocrine ROS: negative for - galactorrhea, hair pattern changes, polydipsia/polyuria or temperature intolerance Respiratory ROS: negative for - cough, hemoptysis, shortness of breath or wheezing Cardiovascular ROS: negative for - chest pain, dyspnea on exertion, edema or  irregular heartbeat Gastrointestinal ROS: negative for - abdominal pain, diarrhea, hematemesis, nausea/vomiting or stool incontinence Genito-Urinary ROS: negative for - dysuria, hematuria, incontinence or urinary frequency/urgency Musculoskeletal ROS: negative for - joint swelling or muscular weakness Neurological ROS: as noted in HPI Dermatological ROS: negative for rash and skin lesion changes   Physical exam: pleasant male in no apparent distress. Blood pressure 134/72, pulse 70, temperature 97.8 F (36.6 C), temperature source Oral, resp. rate 17, SpO2 93.00%. Head: normocephalic. Neck: supple, no bruits, no JVD. Cardiac: no murmurs. Lungs: clear. Abdomen: soft, no tender, no mass. Extremities: no edema. Neurologic Examination:                                                                                                      General: Mental Status: Alert, oriented, thought content appropriate.  Speech fluent without evidence of aphasia.  Able to follow 3 step commands without difficulty. Cranial Nerves: II: Discs flat bilaterally; Visual fields grossly normal, pupils equal, round, reactive to light and accommodation III,IV, VI: ptosis not present, extra-ocular motions intact bilaterally V,VII: smile symmetric, facial light touch sensation normal bilaterally VIII: hearing normal bilaterally IX,X: gag reflex present XI: bilateral shoulder shrug XII: midline tongue extension without atrophy or fasciculations  Motor: Right : Upper extremity   5/5    Left:     Upper extremity   5/5  Lower extremity   5/5     Lower extremity   5/5 Tone and bulk:normal tone throughout;  no atrophy noted Sensory: Pinprick and light touch intact throughout, bilaterally Deep Tendon Reflexes:  Right: Upper Extremity   Left: Upper extremity   biceps (C-5 to C-6) 2/4   biceps (C-5 to C-6) 2/4 tricep (C7) 2/4    triceps (C7) 2/4 Brachioradialis (C6) 2/4  Brachioradialis (C6) 2/4  Lower Extremity Lower  Extremity  quadriceps (L-2 to L-4) 2/4   quadriceps (L-2 to L-4) 2/4 Achilles (S1) 2/4   Achilles (S1) 2/4  Plantars: Right: downgoing   Left: downgoing Cerebellar: normal finger-to-nose,  normal heel-to-shin test Gait:  no tested due to safety reasons CV: pulses palpable throughout    Results for orders placed during the hospital encounter of 07/28/14 (from the past 48 hour(s))  PROTIME-INR     Status: None   Collection Time    07/28/14  4:52 PM      Result Value Ref Range   Prothrombin Time 12.6  11.6 - 15.2 seconds   INR 0.94  0.00 - 1.49  APTT     Status: None   Collection Time    07/28/14  4:52 PM      Result Value Ref Range   aPTT 29  24 - 37 seconds  CBC     Status: None   Collection Time    07/28/14  4:52 PM      Result Value Ref Range   WBC 8.6  4.0 - 10.5 K/uL   RBC 4.66  4.22 - 5.81 MIL/uL   Hemoglobin 13.7  13.0 - 17.0 g/dL   HCT 41.5  39.0 - 52.0 %   MCV 89.1  78.0 - 100.0 fL   MCH 29.4  26.0 - 34.0 pg   MCHC 33.0  30.0 - 36.0 g/dL   RDW 13.6  11.5 - 15.5 %   Platelets 278  150 - 400 K/uL  DIFFERENTIAL     Status: Abnormal   Collection Time    07/28/14  4:52 PM      Result Value Ref Range   Neutrophils Relative % 62  43 - 77 %   Neutro Abs 5.4  1.7 - 7.7 K/uL   Lymphocytes Relative 23  12 - 46 %   Lymphs Abs 2.0  0.7 - 4.0 K/uL   Monocytes Relative 9  3 - 12 %   Monocytes Absolute 0.7  0.1 - 1.0 K/uL   Eosinophils Relative 6 (*) 0 - 5 %   Eosinophils Absolute 0.5  0.0 - 0.7 K/uL   Basophils Relative 0  0 - 1 %   Basophils Absolute 0.0  0.0 - 0.1 K/uL  COMPREHENSIVE METABOLIC PANEL     Status: Abnormal   Collection Time    07/28/14  4:52 PM      Result Value Ref Range   Sodium 141  137 - 147 mEq/L   Potassium 3.8  3.7 - 5.3 mEq/L   Chloride 102  96 - 112 mEq/L   CO2 27  19 - 32 mEq/L   Glucose, Bld 150 (*) 70 - 99 mg/dL   BUN 20  6 - 23 mg/dL   Creatinine, Ser 1.36 (*) 0.50 - 1.35 mg/dL   Calcium 9.3  8.4 - 10.5 mg/dL   Total Protein 7.7   6.0 - 8.3 g/dL   Albumin 4.1  3.5 - 5.2 g/dL   AST 27  0 - 37 U/L   ALT 24  0 - 53 U/L   Alkaline Phosphatase 44  39 - 117 U/L  Total Bilirubin 0.3  0.3 - 1.2 mg/dL   GFR calc non Af Amer 49 (*) >90 mL/min   GFR calc Af Amer 57 (*) >90 mL/min   Comment: (NOTE)     The eGFR has been calculated using the CKD EPI equation.     This calculation has not been validated in all clinical situations.     eGFR's persistently <90 mL/min signify possible Chronic Kidney     Disease.   Anion gap 12  5 - 15  I-STAT TROPOININ, ED     Status: None   Collection Time    07/28/14  5:02 PM      Result Value Ref Range   Troponin i, poc 0.01  0.00 - 0.08 ng/mL   Comment 3            Comment: Due to the release kinetics of cTnI,     a negative result within the first hours     of the onset of symptoms does not rule out     myocardial infarction with certainty.     If myocardial infarction is still suspected,     repeat the test at appropriate intervals.  CBG MONITORING, ED     Status: Abnormal   Collection Time    07/28/14  5:26 PM      Result Value Ref Range   Glucose-Capillary 134 (*) 70 - 99 mg/dL   Ct Head (brain) Wo Contrast  07/28/2014   CLINICAL DATA:  Right-sided weakness and dizziness. History of prior infarct.  EXAM: CT HEAD WITHOUT CONTRAST  TECHNIQUE: Contiguous axial images were obtained from the base of the skull through the vertex without intravenous contrast.  COMPARISON:  None.  FINDINGS: The brain is atrophic with chronic microvascular ischemic change. Encephalomalacia in the right temporal lobe is identified consistent with prior infarct. No evidence of acute infarction, hemorrhage, mass lesion, mass effect, midline shift or abnormal extra-axial fluid collection is identified. There is no hydrocephalus or pneumocephalus. The calvarium is intact. Imaged paranasal sinuses and mastoid air cells are clear.  IMPRESSION: No acute abnormality.  Atrophy, chronic microvascular ischemic change and  remote right temporal lobe infarct.   Electronically Signed   By: Inge Rise M.D.   On: 07/28/2014 17:43    Assessment: 73 y.o. male with possible TIA or  lacunar infarct right brain. Admit to medicine. Complete TIA/stroke work up. Patient already on dual antiplatelet therapy. Stroke team will follow up tomorrow.  Stroke Risk Factors -  HTN, hyperlipidemia, CAD  Plan: 1. HgbA1c, fasting lipid panel 2. MRI, MRA  of the brain without contrast 3. Echocardiogram 4. Carotid dopplers 5. Prophylactic therapy-aspirin and plavix. 6. Risk factor modification 7. Telemetry monitoring 8. Frequent neuro checks 9. PT/OT SLP ( no needed at this time)   Dorian Pod, MD [facility] [phone]  07/28/2014, 6:57 PM

## 2014-07-28 NOTE — ED Notes (Signed)
Dr. Camillio, Neurologist,  at bedside. 

## 2014-07-28 NOTE — ED Notes (Addendum)
Pt reports falling while getting out of shower on Tuesday. States he hit back of head on floor.  Denies LOC.  Reports non-productive cough since fall, sob, and pain across chest when coughing since fall.  Today family reports pt was normal around 9am when they were getting ready to go to church.  Pt usually goes to flea market on Sunday mornings.  When they returned home pt had slurred speech, L sided facial droop,  and L arm was shaking some.  States symptoms started to improve (but not resolve) and then speech worsened.  Pt states he has been "swimmy headed" and has a headache.  LSN was 8 hours ago.

## 2014-07-28 NOTE — ED Notes (Signed)
Pt out of room.

## 2014-07-29 ENCOUNTER — Observation Stay (HOSPITAL_COMMUNITY): Payer: Commercial Managed Care - HMO

## 2014-07-29 DIAGNOSIS — G459 Transient cerebral ischemic attack, unspecified: Secondary | ICD-10-CM

## 2014-07-29 DIAGNOSIS — R42 Dizziness and giddiness: Secondary | ICD-10-CM | POA: Diagnosis not present

## 2014-07-29 DIAGNOSIS — I10 Essential (primary) hypertension: Secondary | ICD-10-CM

## 2014-07-29 DIAGNOSIS — R200 Anesthesia of skin: Secondary | ICD-10-CM | POA: Diagnosis not present

## 2014-07-29 DIAGNOSIS — R05 Cough: Secondary | ICD-10-CM | POA: Diagnosis not present

## 2014-07-29 DIAGNOSIS — E785 Hyperlipidemia, unspecified: Secondary | ICD-10-CM

## 2014-07-29 LAB — BASIC METABOLIC PANEL
Anion gap: 13 (ref 5–15)
BUN: 20 mg/dL (ref 6–23)
CHLORIDE: 103 meq/L (ref 96–112)
CO2: 24 mEq/L (ref 19–32)
CREATININE: 1.24 mg/dL (ref 0.50–1.35)
Calcium: 8.9 mg/dL (ref 8.4–10.5)
GFR calc non Af Amer: 55 mL/min — ABNORMAL LOW (ref >90)
GFR, EST AFRICAN AMERICAN: 64 mL/min — AB (ref >90)
Glucose, Bld: 165 mg/dL — ABNORMAL HIGH (ref 70–99)
Potassium: 3.5 mEq/L — ABNORMAL LOW (ref 3.7–5.3)
Sodium: 140 mEq/L (ref 137–147)

## 2014-07-29 LAB — GLUCOSE, CAPILLARY
GLUCOSE-CAPILLARY: 121 mg/dL — AB (ref 70–99)
Glucose-Capillary: 102 mg/dL — ABNORMAL HIGH (ref 70–99)
Glucose-Capillary: 120 mg/dL — ABNORMAL HIGH (ref 70–99)
Glucose-Capillary: 117 mg/dL — ABNORMAL HIGH (ref 70–99)

## 2014-07-29 LAB — MAGNESIUM: Magnesium: 1.9 mg/dL (ref 1.5–2.5)

## 2014-07-29 LAB — LIPID PANEL
Cholesterol: 120 mg/dL (ref 0–200)
HDL: 29 mg/dL — AB (ref >39)
LDL Cholesterol: 54 mg/dL (ref 0–99)
TRIGLYCERIDES: 184 mg/dL — AB (ref <150)
Total CHOL/HDL Ratio: 4.1 RATIO
VLDL: 37 mg/dL (ref 0–40)

## 2014-07-29 LAB — HEMOGLOBIN A1C
Hgb A1c MFr Bld: 6.4 % — ABNORMAL HIGH (ref <5.7)
Mean Plasma Glucose: 137 mg/dL — ABNORMAL HIGH (ref <117)

## 2014-07-29 LAB — TROPONIN I: Troponin I: <0.3 ng/mL (ref <0.30)

## 2014-07-29 MED ORDER — SIMVASTATIN 20 MG PO TABS
20.0000 mg | ORAL_TABLET | Freq: Every day | ORAL | Status: DC
  Administered 2014-07-29: 20 mg via ORAL
  Filled 2014-07-29: qty 1

## 2014-07-29 MED ORDER — POTASSIUM CHLORIDE CRYS ER 10 MEQ PO TBCR
10.0000 meq | EXTENDED_RELEASE_TABLET | Freq: Every day | ORAL | Status: DC
  Administered 2014-07-29: 10 meq via ORAL
  Filled 2014-07-29: qty 1

## 2014-07-29 MED ORDER — STROKE: EARLY STAGES OF RECOVERY BOOK
Freq: Once | Status: AC
  Administered 2014-07-29: 11:00:00
  Filled 2014-07-29: qty 1

## 2014-07-29 MED ORDER — LOSARTAN POTASSIUM 50 MG PO TABS
50.0000 mg | ORAL_TABLET | Freq: Every day | ORAL | Status: DC
  Administered 2014-07-29 – 2014-07-30 (×2): 50 mg via ORAL
  Filled 2014-07-29 (×2): qty 1

## 2014-07-29 MED ORDER — ASPIRIN EC 81 MG PO TBEC
81.0000 mg | DELAYED_RELEASE_TABLET | Freq: Every day | ORAL | Status: DC
  Administered 2014-07-29 – 2014-07-30 (×2): 81 mg via ORAL
  Filled 2014-07-29: qty 1

## 2014-07-29 MED ORDER — METOPROLOL TARTRATE 50 MG PO TABS
50.0000 mg | ORAL_TABLET | Freq: Two times a day (BID) | ORAL | Status: DC
  Administered 2014-07-29 – 2014-07-30 (×3): 50 mg via ORAL
  Filled 2014-07-29 (×3): qty 1

## 2014-07-29 MED ORDER — ISOSORBIDE MONONITRATE ER 60 MG PO TB24
60.0000 mg | ORAL_TABLET | Freq: Every day | ORAL | Status: DC
  Administered 2014-07-29 – 2014-07-30 (×2): 60 mg via ORAL
  Filled 2014-07-29 (×2): qty 1

## 2014-07-29 MED ORDER — PANTOPRAZOLE SODIUM 40 MG PO TBEC
40.0000 mg | DELAYED_RELEASE_TABLET | Freq: Every day | ORAL | Status: DC
  Administered 2014-07-29 – 2014-07-30 (×2): 40 mg via ORAL
  Filled 2014-07-29 (×2): qty 1

## 2014-07-29 MED ORDER — FUROSEMIDE 40 MG PO TABS
40.0000 mg | ORAL_TABLET | Freq: Every day | ORAL | Status: AC
  Administered 2014-07-29: 40 mg via ORAL
  Filled 2014-07-29: qty 1

## 2014-07-29 MED ORDER — EZETIMIBE 10 MG PO TABS
10.0000 mg | ORAL_TABLET | Freq: Every day | ORAL | Status: DC
  Administered 2014-07-29: 10 mg via ORAL
  Filled 2014-07-29: qty 1

## 2014-07-29 MED ORDER — POTASSIUM CHLORIDE CRYS ER 10 MEQ PO TBCR: 10.0000 meq | EXTENDED_RELEASE_TABLET | Freq: Every day | ORAL | Status: DC

## 2014-07-29 MED ORDER — FUROSEMIDE 80 MG PO TABS
80.0000 mg | ORAL_TABLET | Freq: Every day | ORAL | Status: DC
  Administered 2014-07-30: 80 mg via ORAL
  Filled 2014-07-29: qty 1

## 2014-07-29 MED ORDER — ACETAMINOPHEN 325 MG PO TABS
650.0000 mg | ORAL_TABLET | ORAL | Status: DC | PRN
  Administered 2014-07-29 – 2014-07-30 (×5): 650 mg via ORAL
  Filled 2014-07-29 (×5): qty 2

## 2014-07-29 MED ORDER — CLOPIDOGREL BISULFATE 75 MG PO TABS
75.0000 mg | ORAL_TABLET | Freq: Every day | ORAL | Status: DC
  Administered 2014-07-29 – 2014-07-30 (×2): 75 mg via ORAL
  Filled 2014-07-29 (×2): qty 1

## 2014-07-29 NOTE — Evaluation (Signed)
Physical Therapy Evaluation Patient Details Name: Daniel Mays MRN: [id number] DOB: 1940/11/06 Today's Date: 07/29/2014   History of Present Illness  family found him sitting on the porch with left facial droop Left arm weakness and slurred speech. Patient states he has ad some cough and some chest pain with coughing after he has fallen in the shower.    Clinical Impression  Pt admitted with questionable TIA.  No acute abnormalities seen on scans.  Pt and family all feel he is at baseline level with mobility except for increased speed with ambulation which they attribute to patient "trying to show off so he can get out of here quicker."  No skilled PT needs identified. They declined stair training, and wife aware that if pt status changes, she can request MD to re-order PT.  Will sign off at this time.    Follow Up Recommendations No PT follow up    Equipment Recommendations  None recommended by PT    Recommendations for Other Services       Precautions / Restrictions Precautions Precautions: None      Mobility  Bed Mobility               General bed mobility comments: up in recliner upon arrival  Transfers Overall transfer level: Modified independent                  Ambulation/Gait Ambulation/Gait assistance: Modified independent (Device/Increase time);Supervision Ambulation Distance (Feet): 200 Feet Assistive device: None   Gait velocity: quick, cues to slow down   General Gait Details: Pt ambulated without AD with quick cadence.  Family reports he never walks this fast and is just showing off so he can get out of the hospital.  besides speed, pt and family all feel that pt is at baseline with mobility.  Stairs            Wheelchair Mobility    Modified Rankin (Stroke Patients Only)       Balance Overall balance assessment: Modified Independent                                           Pertinent Vitals/Pain Pain  Assessment: No/denies pain    Home Living Family/patient expects to be discharged to:: Private residence Living Arrangements: Spouse/significant other Available Help at Discharge: Family Type of Home: House Home Access: Stairs to enter Entrance Stairs-Rails: Right Entrance Stairs-Number of Steps: 3 Home Layout: One level Home Equipment: Environmental consultant - 2 wheels;Bedside commode      Prior Function Level of Independence: Independent               Hand Dominance        Extremity/Trunk Assessment   Upper Extremity Assessment: Defer to OT evaluation           Lower Extremity Assessment: Overall WFL for tasks assessed      Cervical / Trunk Assessment: Normal  Communication   Communication: HOH  Cognition Arousal/Alertness: Awake/alert Behavior During Therapy: WFL for tasks assessed/performed Overall Cognitive Status: Within Functional Limits for tasks assessed                      General Comments General comments (skin integrity, edema, etc.): SLS 4-5 seconds each leg. "I haven't been able to do that for 10 years"  and external perturbations with no LOB  Exercises        Assessment/Plan    PT Assessment Patent does not need any further PT services  PT Diagnosis Difficulty walking   PT Problem List    PT Treatment Interventions     PT Goals (Current goals can be found in the Care Plan section) Acute Rehab PT Goals PT Goal Formulation: All assessment and education complete, DC therapy    Frequency     Barriers to discharge        Co-evaluation               End of Session Equipment Utilized During Treatment: Gait belt Activity Tolerance: Patient tolerated treatment well Patient left: in chair;with chair alarm set Nurse Communication: Mobility status    Functional Assessment Tool Used: clinical judgement and objective findings Functional Limitation: Mobility: Walking and moving around Mobility: Walking and Moving Around Current  Status [phone](G8978): 0 percent impaired, limited or restricted Mobility: Walking and Moving Around Goal Status [phone](G8979): 0 percent impaired, limited or restricted Mobility: Walking and Moving Around Discharge Status [phone](G8980): 0 percent impaired, limited or restricted    Time: [id number] PT Time Calculation (min): 19 min   Charges:   PT Evaluation $Initial PT Evaluation Tier I: 1 Procedure PT Treatments $Gait Training: 8-22 mins   PT G Codes:   Functional Assessment Tool Used: clinical judgement and objective findings Functional Limitation: Mobility: Walking and moving around    [facility] HospitalMITH,Jay Haskew LUBECK 07/29/2014, 12:10 PM

## 2014-07-29 NOTE — ED Provider Notes (Signed)
CSN: [id number]     Arrival date & time 07/28/14  1640 History   First MD Initiated Contact with Patient 07/28/14 1705     Chief Complaint  Patient presents with  . Stroke Symptoms  . Chest Pain     (Consider location/radiation/quality/duration/timing/severity/associated sxs/prior Treatment) HPI Comments: 73 y.o. male with a past medical history of Heart attack (2007); Hypertension; OSA (obstructive sleep apnea); Hyperlipidemia; and CAD (coronary artery disease) comes in with cc of stroke like symptoms. PT was feeling unwell all day, but in the afternoon, he started feeling dizzy. Pt went on with the day, but overtime, developed slurred speech and left sided facial droop. Pt's family witnessed this episode. Last normal at 1 pm, but with dizziness mght have started in the morning. Pt unstable with gait per family. There is a fall 2 weeks ago. Pt's symptoms have improved gradually, and speech has normalized.  Patient is a 73 y.o. male presenting with chest pain. The history is provided by the patient and medical records.  Chest Pain Associated symptoms: dizziness and numbness   Associated symptoms: no abdominal pain, no cough and no shortness of breath     Past Medical History  Diagnosis Date  . Heart attack 2007  . Hypertension   . OSA (obstructive sleep apnea)   . Hyperlipidemia   . CAD (coronary artery disease)     2D ECHO, 03/17/2010 - EF 45-50%, normal   Past Surgical History  Procedure Laterality Date  . Coronary angioplasty with stent placement  2010  . Eye surgery  1987  . Exercise stress test  01/18/2013    Small fixed inferolateral defect-artifact is favored. No ischemia  . Cardiac catheterization  04/01/2008    LAD ostium stented with a 2.5x6mm Promus DES stent reducing a 95% stenosis to 0%; Proximal intermediate Ramus stented with a 2.75x36mm Promus drug-eluting stent reducing a 85% stenosis to 0% residual  . Cardiac catheterization  03/26/2008    Increased medical  management  . Cardiac catheterization  04/27/2006    CABG recommended  . Tee without cardioversion  04/29/2006  . Coronary artery bypass graft  04/29/2006    LIMA to LAD, SVG to first diagonal, SVG to ramus intermedius, SVG to PDA, and SVG to PLA branch of RCA   Family History  Problem Relation Age of Onset  . Parkinson's disease Mother   . Diabetes Mother    History  Substance Use Topics  . Smoking status: Former Smoker -- 5.00 packs/day for 55 years    Types: Cigarettes    Start date: 10/04/1946    Quit date: 10/04/2001  . Smokeless tobacco: Never Used  . Alcohol Use: Yes     Comment: occ    Review of Systems  Constitutional: Negative for activity change and appetite change.  Eyes: Negative for visual disturbance.  Respiratory: Negative for cough and shortness of breath.   Cardiovascular: Positive for chest pain.  Gastrointestinal: Negative for abdominal pain.  Genitourinary: Negative for dysuria.  Neurological: Positive for dizziness, facial asymmetry, speech difficulty and numbness. Negative for syncope.  Psychiatric/Behavioral: Negative for confusion. The patient is not nervous/anxious.       Allergies  Review of patient's allergies indicates no known allergies.  Home Medications   Prior to Admission medications   Medication Sig Start Date End Date Taking? Authorizing Provider  acetaminophen (TYLENOL) 500 MG tablet Take 500 mg by mouth 3 (three) times daily.   Yes Historical Provider, MD  aspirin EC 81 MG tablet Take  81 mg by mouth daily.   Yes Historical Provider, MD  clopidogrel (PLAVIX) 75 MG tablet Take 75 mg by mouth daily.   Yes Historical Provider, MD  ezetimibe (ZETIA) 10 MG tablet Take 10 mg by mouth at bedtime.   Yes Historical Provider, MD  furosemide (LASIX) 40 MG tablet Take 40 mg by mouth daily. Takes 2 tablets 01/08/14  Yes Lennette Biharihomas A Kelly, MD  isosorbide mononitrate (IMDUR) 60 MG 24 hr tablet Take 1 tablet (60 mg total) by mouth daily. 12/07/13  Yes Lennette Biharihomas  A Kelly, MD  losartan (COZAAR) 50 MG tablet Take 1 tablet (50 mg total) by mouth daily. 03/21/14  Yes Lennette Biharihomas A Kelly, MD  metoprolol (LOPRESSOR) 50 MG tablet Take 50 mg by mouth 2 (two) times daily.   Yes Historical Provider, MD  pantoprazole (PROTONIX) 40 MG tablet Take 40 mg by mouth daily.   Yes Historical Provider, MD  potassium chloride SA (K-DUR,KLOR-CON) 20 MEQ tablet Take 10-20 mEq by mouth at bedtime.   Yes Historical Provider, MD  simvastatin (ZOCOR) 20 MG tablet Take 20 mg by mouth at bedtime.   Yes Historical Provider, MD   BP 163/94  Pulse 77  Temp(Src) 98.5 F (36.9 C) (Oral)  Resp 18  Ht 5\' 8"  (1.727 m)  Wt 226 lb 9.6 oz (102.785 kg)  BMI 34.46 kg/m2  SpO2 95% Physical Exam  Nursing note and vitals reviewed. Constitutional: He is oriented to person, place, and time. He appears well-developed.  HENT:  Head: Normocephalic and atraumatic.  Eyes: Conjunctivae and EOM are normal. Pupils are equal, round, and reactive to light.  Neck: Normal range of motion. Neck supple.  Cardiovascular: Normal rate and regular rhythm.   Pulmonary/Chest: Effort normal and breath sounds normal.  Abdominal: Soft. Bowel sounds are normal. He exhibits no distension. There is no tenderness. There is no rebound and no guarding.  Neurological: He is alert and oriented to person, place, and time. No cranial nerve deficit. Coordination normal.  Skin: Skin is warm.    ED Course  Procedures (including critical care time) Labs Review Labs Reviewed  DIFFERENTIAL - Abnormal; Notable for the following:    Eosinophils Relative 6 (*)    All other components within normal limits  COMPREHENSIVE METABOLIC PANEL - Abnormal; Notable for the following:    Glucose, Bld 150 (*)    Creatinine, Ser 1.36 (*)    GFR calc non Af Amer 49 (*)    GFR calc Af Amer 57 (*)    All other components within normal limits  LIPID PANEL - Abnormal; Notable for the following:    Triglycerides 184 (*)    HDL 29 (*)    All  other components within normal limits  GLUCOSE, CAPILLARY - Abnormal; Notable for the following:    Glucose-Capillary 102 (*)    All other components within normal limits  CBG MONITORING, ED - Abnormal; Notable for the following:    Glucose-Capillary 134 (*)    All other components within normal limits  PROTIME-INR  APTT  CBC  TROPONIN I  TROPONIN I  HEMOGLOBIN A1C  TROPONIN I  I-STAT TROPOININ, ED    Imaging Review Dg Chest 2 View  07/28/2014   CLINICAL DATA:  Status post fall 07/22/2014.  EXAM: CHEST  2 VIEW  COMPARISON:  PA and lateral chest 06/01/2012.  FINDINGS: The lungs are clear. Heart size is normal. No pneumothorax or pleural effusion. The patient is status post CABG.  IMPRESSION: No acute disease.  Electronically Signed   By: Drusilla Kanner M.D.   On: 07/28/2014 19:17   Ct Head (brain) Wo Contrast  07/28/2014   CLINICAL DATA:  Right-sided weakness and dizziness. History of prior infarct.  EXAM: CT HEAD WITHOUT CONTRAST  TECHNIQUE: Contiguous axial images were obtained from the base of the skull through the vertex without intravenous contrast.  COMPARISON:  None.  FINDINGS: The brain is atrophic with chronic microvascular ischemic change. Encephalomalacia in the right temporal lobe is identified consistent with prior infarct. No evidence of acute infarction, hemorrhage, mass lesion, mass effect, midline shift or abnormal extra-axial fluid collection is identified. There is no hydrocephalus or pneumocephalus. The calvarium is intact. Imaged paranasal sinuses and mastoid air cells are clear.  IMPRESSION: No acute abnormality.  Atrophy, chronic microvascular ischemic change and remote right temporal lobe infarct.   Electronically Signed   By: Drusilla Kanner M.D.   On: 07/28/2014 17:43   Mr Maxine Glenn Head Wo Contrast  07/29/2014   CLINICAL DATA:  Slurred speech.  Possible stroke.  EXAM: MRI HEAD WITHOUT CONTRAST  MRA HEAD WITHOUT CONTRAST  TECHNIQUE: Multiplanar, multiecho pulse  sequences of the brain and surrounding structures were obtained without intravenous contrast. Angiographic images of the head were obtained using MRA technique without contrast.  COMPARISON:  None.  FINDINGS: MRI HEAD FINDINGS  The patient was unable to remain motionless for the exam. Small or subtle lesions could be overlooked.  No evidence for acute infarction, hemorrhage, or mass lesion. Moderate ventricular enlargement, related to central atrophy. Remote RIGHT MCA territory infarct affects the RIGHT temporal lobe primarily, also portions of the RIGHT frontal operculum. T2 and FLAIR hyperintensities throughout the periventricular and subcortical white matter a mild degree, likely chronic microvascular ischemic change. There is a focal extra-axial collection over the LEFT frontal lobe, 17 x 33 mm cross-section, consistent with a benign incidental arachnoid cyst. Flow voids are maintained. No midline abnormality. No sinus or mastoid disease. RIGHT cataract extraction Good general agreement prior CT.  MRA HEAD FINDINGS  The LEFT internal carotid artery is widely patent. Mild non stenotic irregularity supraclinoid ICA on the RIGHT. ICA termini widely patent. The basilar artery is mildly irregular, with LEFT vertebral dominant. RIGHT vertebral is diminutive, and may be diseased in its proximal V4 segment. There is mild non stenotic irregularity of the M1 MCA on the RIGHT. Mild non stenotic irregularity of the mid M1 MCA on the LEFT. No flow reducing lesion of the ACA or PCA vessels. No definite cerebellar branch occlusion. No intracranial aneurysm  IMPRESSION: Motion degraded exam.  Chronic changes as described. No evidence for acute infarction or intracranial hemorrhage.  Remote RIGHT MCA territory infarct. Incidental LEFT frontal arachnoid cyst.  Other than suspected disease of the non dominant RIGHT vertebral, no proximal flow reducing lesion of the intracranial circulation.   Electronically Signed   By: Davonna Belling M.D.   On: 07/29/2014 08:53   Mr Brain Wo Contrast  07/29/2014   CLINICAL DATA:  Slurred speech.  Possible stroke.  EXAM: MRI HEAD WITHOUT CONTRAST  MRA HEAD WITHOUT CONTRAST  TECHNIQUE: Multiplanar, multiecho pulse sequences of the brain and surrounding structures were obtained without intravenous contrast. Angiographic images of the head were obtained using MRA technique without contrast.  COMPARISON:  None.  FINDINGS: MRI HEAD FINDINGS  The patient was unable to remain motionless for the exam. Small or subtle lesions could be overlooked.  No evidence for acute infarction, hemorrhage, or mass lesion. Moderate ventricular enlargement, related  to central atrophy. Remote RIGHT MCA territory infarct affects the RIGHT temporal lobe primarily, also portions of the RIGHT frontal operculum. T2 and FLAIR hyperintensities throughout the periventricular and subcortical white matter a mild degree, likely chronic microvascular ischemic change. There is a focal extra-axial collection over the LEFT frontal lobe, 17 x 33 mm cross-section, consistent with a benign incidental arachnoid cyst. Flow voids are maintained. No midline abnormality. No sinus or mastoid disease. RIGHT cataract extraction Good general agreement prior CT.  MRA HEAD FINDINGS  The LEFT internal carotid artery is widely patent. Mild non stenotic irregularity supraclinoid ICA on the RIGHT. ICA termini widely patent. The basilar artery is mildly irregular, with LEFT vertebral dominant. RIGHT vertebral is diminutive, and may be diseased in its proximal V4 segment. There is mild non stenotic irregularity of the M1 MCA on the RIGHT. Mild non stenotic irregularity of the mid M1 MCA on the LEFT. No flow reducing lesion of the ACA or PCA vessels. No definite cerebellar branch occlusion. No intracranial aneurysm  IMPRESSION: Motion degraded exam.  Chronic changes as described. No evidence for acute infarction or intracranial hemorrhage.  Remote RIGHT MCA  territory infarct. Incidental LEFT frontal arachnoid cyst.  Other than suspected disease of the non dominant RIGHT vertebral, no proximal flow reducing lesion of the intracranial circulation.   Electronically Signed   By: Davonna Belling M.D.   On: 07/29/2014 08:53     EKG Interpretation None      MDM   Final diagnoses:  Cough  Transient cerebral ischemia, unspecified transient cerebral ischemia type    Interval: Baseline (10/25 1729) Level of Consciousness (1a. ): Alert, keenly responsive (10/25 2300) LOC Questions (1b. ): Answers both questions correctly (10/25 2300) LOC Commands (1c. ): Performs both tasks correctly (10/25 2300) Best Gaze (2. ): Normal (10/25 2300) Visual (3. ): No visual loss (10/25 2300) Facial Palsy (4. ): Normal symmetrical movements (10/25 2300) Motor Arm, Left (5a. ): No drift (10/25 2300) Motor Arm, Right (5b. ): No drift (10/25 2300) Motor Leg, Left (6a. ): No drift (10/25 2300) Motor Leg, Right (6b. ): No drift (10/25 2300) Limb Ataxia (7. ): Absent (10/25 2300) Sensory (8. ): Normal, no sensory loss (10/25 2300) Best Language (9. ): No aphasia (10/25 2300) Dysarthria (10. ): Normal (10/25 2300) Inattention/Extinction: No Abnormality (10/25 2300) Total: 0 (10/25 2300)   Pt comes in with stroke like symptoms, that have resolved. Hx of CAD and other vascular problems, but no strokes in the past. Appears to have had a TIA, and we will admit for further evaluation. Neuro consulted.  Derwood Kaplan, MD 07/29/14 1054

## 2014-07-29 NOTE — Progress Notes (Signed)
STROKE TEAM PROGRESS NOTE   HISTORY Daniel Mays is an 73 y.o. male with a past medical history significant for HTN, hyperlipidemia, CAD s/p CABG and stenting, OSA, brought in by family for further evaluation of dysarthria, left face weakness, confusion. Family is at the bedside and stated that when they came back form church around 1 pm they found him sitting in the porch and they noticed that he was slurring his words and had left face weakness and was also drooling.L arm was shaking some and mild confusion was also observed, Family thinks he was insecure walking because today he did not feel confident going to the [employer]. Overall, he is improving. He complains of a slight HA but denies vertigo, double vision, difficulty swallowing, focal arm/leg weakness, or visual impairment. CT brain showed no acute abnormality. Takes aspirin and plavix daily. last known well 07/28/14, time uncertain.  Patient was not administered TPA secondary to late presentation, symptoms resolving. He was admitted for further evaluation and treatment.   SUBJECTIVE (INTERVAL HISTORY) No one is at the bedside.  Overall he feels his condition is gradually improving. He stated that yesterday when he was in shopping and he bent down to look for stuff, his belly againsted his chest and made his not able to breath and gasp for air. He was found by family that he had some confusion and not able to talk with drooling. He was sent here for evaluation. The symptoms resolved in aobut 3-4 hours.  OBJECTIVE Temp:  [97.8 F (36.6 C)-98.6 F (37 C)] 98.5 F (36.9 C) (10/26 0400) Pulse Rate:  [59-74] 62 (10/26 0400) Cardiac Rhythm:  [-] Normal sinus rhythm;Sinus bradycardia (10/25 2300) Resp:  [13-22] 20 (10/26 0400) BP: (133-179)/(72-96) 139/79 mmHg (10/26 0400) SpO2:  [92 %-97 %] 96 % (10/26 0400) Weight:  [102.785 kg (226 lb 9.6 oz)] 102.785 kg (226 lb 9.6 oz) (10/25 2340)   Recent Labs Lab 07/28/14 1726  GLUCAP 134*     Recent Labs Lab 07/28/14 1652  NA 141  K 3.8  CL 102  CO2 27  GLUCOSE 150*  BUN 20  CREATININE 1.36*  CALCIUM 9.3    Recent Labs Lab 07/28/14 1652  AST 27  ALT 24  ALKPHOS 44  BILITOT 0.3  PROT 7.7  ALBUMIN 4.1    Recent Labs Lab 07/28/14 1652  WBC 8.6  NEUTROABS 5.4  HGB 13.7  HCT 41.5  MCV 89.1  PLT 278    Recent Labs Lab 07/29/14 0047  TROPONINI <0.30    Recent Labs  07/28/14 1652  LABPROT 12.6  INR 0.94   No results found for this basename: COLORURINE, APPERANCEUR, LABSPEC, PHURINE, GLUCOSEU, HGBUR, BILIRUBINUR, KETONESUR, PROTEINUR, UROBILINOGEN, NITRITE, LEUKOCYTESUR,  in the last 72 hours     Component Value Date/Time   CHOL 120 07/29/2014 0047   TRIG 184* 07/29/2014 0047   HDL 29* 07/29/2014 0047   HDL 38* 07/12/2013 0935   CHOLHDL 4.1 07/29/2014 0047   VLDL 37 07/29/2014 0047   LDLCALC 54 07/29/2014 0047   LDLCALC 66 07/12/2013 0935   Lab Results  Component Value Date   HGBA1C 6.4* 07/28/2014   No results found for this basename: labopia,  cocainscrnur,  labbenz,  amphetmu,  thcu,  labbarb    No results found for this basename: ETH,  in the last 168 hours  I have personally reviewed the radiological images below and agree with the radiology interpretations in most cases but some disagreement in bold text.  Dg Chest 2 View  07/28/2014   No acute disease.     CT Head (brain) Wo Contrast  07/28/2014    No acute abnormality.  Atrophy, chronic microvascular ischemic change and remote right temporal lobe infarct.     MRI Head   07/29/2014    Motion degraded exam.  Chronic changes as described. No evidence for acute infarction or intracranial hemorrhage.  Remote RIGHT MCA territory infarct (I think it is a right temporal arachnoid cyst, does not consistent with chronic infarct). Incidental LEFT frontal arachnoid cyst.    MRA Head   07/29/2014    Other than suspected disease of the non dominant RIGHT vertebral, no proximal flow reducing  lesion of the intracranial circulation.     2D echo - - Left ventricle: The cavity size was normal. Systolic function was normal. The estimated ejection fraction was in the range of 45% to 50%. Wall motion was normal; there were no regional wall motion abnormalities.  CUS - pending  PHYSICAL EXAM Physical exam  Temp:  [98.1 F (36.7 C)-98.6 F (37 C)] 98.1 F (36.7 C) (10/26 2029) Pulse Rate:  [58-77] 66 (10/26 2029) Resp:  [17-21] 18 (10/26 1428) BP: (106-165)/(64-96) 147/68 mmHg (10/26 2029) SpO2:  [93 %-96 %] 96 % (10/26 2029) Weight:  [226 lb 9.6 oz (102.785 kg)] 226 lb 9.6 oz (102.785 kg) (10/25 2340)  General - Well nourished, well developed, in no apparent distress.  Ophthalmologic - not able to see through.  Cardiovascular - Regular rate and rhythm with no murmur.  Mental Status -  Level of arousal and orientation to time, place, and person were intact. Language including expression, naming, repetition, comprehension was assessed and found intact.  Cranial Nerves II - XII - II - Visual field intact OU III, IV, VI - Extraocular movements intact. V - Facial sensation intact bilaterally. VII - Facial movement intact bilaterally. VIII - Hard of hearing & vestibular intact bilaterally. X - Palate elevates symmetrically. XI - Chin turning & shoulder shrug intact bilaterally. XII - Tongue protrusion intact.  Motor Strength - The patient's strength was normal in all extremities and pronator drift was absent.  Bulk was normal and fasciculations were absent.   Motor Tone - Muscle tone was assessed at the neck and appendages and was normal.  Reflexes - The patient's reflexes were normal in all extremities and he had no pathological reflexes.  Sensory - Light touch, temperature/pinprick were assessed and were normal.    Coordination - The patient had normal movements in the hands and feet with no ataxia or dysmetria.  Tremor was absent.  Gait and Station -  deferred.   ASSESSMENT/PLAN Mr. ASMAR BROZEK is a 73 y.o. male with history of HTN, hyperlipidemia, CAD s/p CABG and stenting, OSA presenting with dysarthria, drooling and confusion after episode of bending over with abdomen against chest causing SOB and gasping for air. He did not receive IV t-PA due to late presentation, symptoms resolving. MRI negative for stroke.  Questionable for TIA - per pt description, this event more related to respiratory distress, not sure about how significant of focal neuro deficit at that time. MRI negative for acute stroke. It is suspicious whether this is TIA or just episode related to respiratory distress. Will continue his dural antiplatelet as well as statin for stroke and cardiac prevention.  MRI  No acute stroke. Reported right MCA chronic infarct more like right arachnoid cyst per my read.  MRA  No significant stenosis, but  significant motion and artifact.  Carotid Doppler  pending   2D Echo  unremarkable   HgbA1c 6.4, at the goal  SCDs for VTE prophylaxis  Cardiac thin liquids  Bathroom privileges with assistance  aspirin 81 mg orally every day and clopidogrel 75 mg orally every day prior to admission, now on aspirin 81 mg orally every day and clopidogrel 75 mg orally every day  Patient counseled to be compliant with his antithrombotic medications  Risk factor education  Ongoing aggressive risk factor management  Hypertension  Home meds:  Imdur, cozaar, lopressor, lasix  OK to restart BP meds  Avoid hypotension  BP goal normotensive  Hyperlipidemia  Home meds:  zocor 20 and zetia 10, resumed in hospital  LDL 66 at the goal < 70  Continue statin at discharge  CAD s/p CABG and stenting - continue ASA and plavix for cardiac prevention - continue statin - follow up with cardiology as outpt  Other Stroke Risk Factors Advanced age Former Cigarette smoker, quit 12 yrs ago ETOH use   Obesity, Body mass index is 34.46  kg/(m^2).   Obstructive sleep apnea, refused CPAP  Hospital day # 1  Neurology will sign off. Please call with questions. Thanks for the consult.  Marvel PlanJindong Kumar Falwell, MD PhD Stroke Neurology 07/29/2014 9:46 PM     To contact Stroke Continuity provider, please refer to [email or web address]. After hours, contact General Neurology

## 2014-07-29 NOTE — Progress Notes (Addendum)
PROGRESS NOTE    Daniel Mays IZT:[id number] DOB: July 07, 1941 DOA: 07/28/2014 PCP: Jerl Mina, MD  HPI/Brief narrative 73 year old male with history of CAD, CABG, HTN, OSA, HLD presented with dysarthria, left face weakness and confusion. Family noticed that when they came back from church around 1 PM on 10/25, they found him sitting in the porch with slurring of his speech, drooping of his left side of face, left arm was shaking and patient was mildly confused. Patient apparently was unsteady walking prior to symptoms. He stated chest pain on admission but denied ever having chest pain today.   Assessment/Plan:  1. TIA: CT head negative for acute stroke. Neurology consulted and stroke workup initiated. MRI brain also negative for acute stroke but shows remote right MCA territory infarct. Complete stroke workup-2-D echo, carotid Dopplers, hemoglobin A1c and fasting lipids. Continue dual antiplatelet - aspirin and Plavix. Stroke team to follow. PT recommends no follow-up. Symptoms have resolved.  2. Hypertension: Mildly uncontrolled but alow for permissive hypertension in the context of TIA. Continue current doses of losartan and metoprolol. 3. History of CAD/CABG: Initially stated chest pain but subsequently stated that he never had chest pain. Troponins 3 negative. Continue aspirin, Plavix, beta blockers and statins. 4. OSA: Apparently noncompliant with C Pap 5. HLD: Continue statins. LDL at goal: 54 6. NSSVT & ? NSVT: Check magnesium and keep >2 and potassium >4. Follow 2-D echo. Continue monitoring on telemetry. Continue metoprolol. 7. Stage III chronic kidney disease: Creatinine stable.   Code Status:  Full Family Communication:  Discussed with spouse and son at bedside Disposition Plan:  Home possibly 10/27   Consultants:   Neurology  Procedures:   None  Antibiotics:   None   Subjective:  Denies complaints. Denies facial weakness, confusion or chest pain. Anxious  to go home.  Objective: Filed Vitals:   07/29/14 0200 07/29/14 0400 07/29/14 0908 07/29/14 1428  BP: 150/78 139/79 163/94 106/64  Pulse: 68 62 77 58  Temp:  98.5 F (36.9 C)  98.3 F (36.8 C)  TempSrc:  Oral  Oral  Resp: 18 20 18 18   Height:      Weight:      SpO2: 95% 96% 95% 95%    Intake/Output Summary (Last 24 hours) at 07/29/14 1747 Last data filed at 07/29/14 3825  Gross per 24 hour  Intake    480 ml  Output    300 ml  Net    180 ml   Filed Weights   07/28/14 2340  Weight: 102.785 kg (226 lb 9.6 oz)     Exam:  General exam:  Pleasant elderly male sitting comfortably in chair this morning. Respiratory system: Clear. No increased work of breathing. Cardiovascular system: S1 & S2 heard, RRR. No JVD, murmurs, gallops, clicks or pedal edema. telemetry: Sinus rhythm.? 4 beat NSVT and the short run of NSSVT Gastrointestinal system: Abdomen is nondistended, soft and nontender. Normal bowel sounds heard. Central nervous system: Alert and oriented. No focal neurological deficits. Extremities: Symmetric 5 x 5 power.   Data Reviewed: Basic Metabolic Panel:  Recent Labs Lab 07/28/14 1652  NA 141  K 3.8  CL 102  CO2 27  GLUCOSE 150*  BUN 20  CREATININE 1.36*  CALCIUM 9.3   Liver Function Tests:  Recent Labs Lab 07/28/14 1652  AST 27  ALT 24  ALKPHOS 44  BILITOT 0.3  PROT 7.7  ALBUMIN 4.1   No results found for this basename: LIPASE, AMYLASE,  in the last  168 hours No results found for this basename: AMMONIA,  in the last 168 hours CBC:  Recent Labs Lab 07/28/14 1652  WBC 8.6  NEUTROABS 5.4  HGB 13.7  HCT 41.5  MCV 89.1  PLT 278   Cardiac Enzymes:  Recent Labs Lab 07/29/14 0047 07/29/14 0831 07/29/14 1240  TROPONINI <0.30 <0.30 <0.30   BNP (last 3 results) No results found for this basename: PROBNP,  in the last 8760 hours CBG:  Recent Labs Lab 07/28/14 1726 07/29/14 0837 07/29/14 1150 07/29/14 1720  GLUCAP 134* 102* 120* 121*     No results found for this or any previous visit (from the past 240 hour(s)).       Studies: Dg Chest 2 View  07/28/2014   CLINICAL DATA:  Status post fall 07/22/2014.  EXAM: CHEST  2 VIEW  COMPARISON:  PA and lateral chest 06/01/2012.  FINDINGS: The lungs are clear. Heart size is normal. No pneumothorax or pleural effusion. The patient is status post CABG.  IMPRESSION: No acute disease.   Electronically Signed   By: Drusilla Kannerhomas  Dalessio M.D.   On: 07/28/2014 19:17   Ct Head (brain) Wo Contrast  07/28/2014   CLINICAL DATA:  Right-sided weakness and dizziness. History of prior infarct.  EXAM: CT HEAD WITHOUT CONTRAST  TECHNIQUE: Contiguous axial images were obtained from the base of the skull through the vertex without intravenous contrast.  COMPARISON:  None.  FINDINGS: The brain is atrophic with chronic microvascular ischemic change. Encephalomalacia in the right temporal lobe is identified consistent with prior infarct. No evidence of acute infarction, hemorrhage, mass lesion, mass effect, midline shift or abnormal extra-axial fluid collection is identified. There is no hydrocephalus or pneumocephalus. The calvarium is intact. Imaged paranasal sinuses and mastoid air cells are clear.  IMPRESSION: No acute abnormality.  Atrophy, chronic microvascular ischemic change and remote right temporal lobe infarct.   Electronically Signed   By: Drusilla Kannerhomas  Dalessio M.D.   On: 07/28/2014 17:43   Mr Maxine GlennMra Head Wo Contrast  07/29/2014   CLINICAL DATA:  Slurred speech.  Possible stroke.  EXAM: MRI HEAD WITHOUT CONTRAST  MRA HEAD WITHOUT CONTRAST  TECHNIQUE: Multiplanar, multiecho pulse sequences of the brain and surrounding structures were obtained without intravenous contrast. Angiographic images of the head were obtained using MRA technique without contrast.  COMPARISON:  None.  FINDINGS: MRI HEAD FINDINGS  The patient was unable to remain motionless for the exam. Small or subtle lesions could be overlooked.  No  evidence for acute infarction, hemorrhage, or mass lesion. Moderate ventricular enlargement, related to central atrophy. Remote RIGHT MCA territory infarct affects the RIGHT temporal lobe primarily, also portions of the RIGHT frontal operculum. T2 and FLAIR hyperintensities throughout the periventricular and subcortical white matter a mild degree, likely chronic microvascular ischemic change. There is a focal extra-axial collection over the LEFT frontal lobe, 17 x 33 mm cross-section, consistent with a benign incidental arachnoid cyst. Flow voids are maintained. No midline abnormality. No sinus or mastoid disease. RIGHT cataract extraction Good general agreement prior CT.  MRA HEAD FINDINGS  The LEFT internal carotid artery is widely patent. Mild non stenotic irregularity supraclinoid ICA on the RIGHT. ICA termini widely patent. The basilar artery is mildly irregular, with LEFT vertebral dominant. RIGHT vertebral is diminutive, and may be diseased in its proximal V4 segment. There is mild non stenotic irregularity of the M1 MCA on the RIGHT. Mild non stenotic irregularity of the mid M1 MCA on the LEFT. No flow reducing lesion  of the ACA or PCA vessels. No definite cerebellar branch occlusion. No intracranial aneurysm  IMPRESSION: Motion degraded exam.  Chronic changes as described. No evidence for acute infarction or intracranial hemorrhage.  Remote RIGHT MCA territory infarct. Incidental LEFT frontal arachnoid cyst.  Other than suspected disease of the non dominant RIGHT vertebral, no proximal flow reducing lesion of the intracranial circulation.   Electronically Signed   By: Davonna Belling M.D.   On: 07/29/2014 08:53   Mr Brain Wo Contrast  07/29/2014   CLINICAL DATA:  Slurred speech.  Possible stroke.  EXAM: MRI HEAD WITHOUT CONTRAST  MRA HEAD WITHOUT CONTRAST  TECHNIQUE: Multiplanar, multiecho pulse sequences of the brain and surrounding structures were obtained without intravenous contrast. Angiographic  images of the head were obtained using MRA technique without contrast.  COMPARISON:  None.  FINDINGS: MRI HEAD FINDINGS  The patient was unable to remain motionless for the exam. Small or subtle lesions could be overlooked.  No evidence for acute infarction, hemorrhage, or mass lesion. Moderate ventricular enlargement, related to central atrophy. Remote RIGHT MCA territory infarct affects the RIGHT temporal lobe primarily, also portions of the RIGHT frontal operculum. T2 and FLAIR hyperintensities throughout the periventricular and subcortical white matter a mild degree, likely chronic microvascular ischemic change. There is a focal extra-axial collection over the LEFT frontal lobe, 17 x 33 mm cross-section, consistent with a benign incidental arachnoid cyst. Flow voids are maintained. No midline abnormality. No sinus or mastoid disease. RIGHT cataract extraction Good general agreement prior CT.  MRA HEAD FINDINGS  The LEFT internal carotid artery is widely patent. Mild non stenotic irregularity supraclinoid ICA on the RIGHT. ICA termini widely patent. The basilar artery is mildly irregular, with LEFT vertebral dominant. RIGHT vertebral is diminutive, and may be diseased in its proximal V4 segment. There is mild non stenotic irregularity of the M1 MCA on the RIGHT. Mild non stenotic irregularity of the mid M1 MCA on the LEFT. No flow reducing lesion of the ACA or PCA vessels. No definite cerebellar branch occlusion. No intracranial aneurysm  IMPRESSION: Motion degraded exam.  Chronic changes as described. No evidence for acute infarction or intracranial hemorrhage.  Remote RIGHT MCA territory infarct. Incidental LEFT frontal arachnoid cyst.  Other than suspected disease of the non dominant RIGHT vertebral, no proximal flow reducing lesion of the intracranial circulation.   Electronically Signed   By: Davonna Belling M.D.   On: 07/29/2014 08:53        Scheduled Meds: . aspirin EC  81 mg Oral Daily  . aspirin EC   81 mg Oral Daily  . clopidogrel  75 mg Oral Daily  . ezetimibe  10 mg Oral QHS  . isosorbide mononitrate  60 mg Oral Daily  . losartan  50 mg Oral Daily  . metoprolol  50 mg Oral BID  . pantoprazole  40 mg Oral Daily  . potassium chloride SA  10 mEq Oral QHS  . simvastatin  20 mg Oral QHS   Continuous Infusions:   Active Problems:   Sleep apnea, obstructive   Coronary atherosclerosis of native coronary artery   TIA (transient ischemic attack)    Time spent:  25 minutes    Melia Hopes, MD, FACP, FHM. [facility] Pager [phone]  If 7PM-7AM, please contact night-coverage [email or web address] Password [email or web address] 07/29/2014, 5:47 PM    LOS: 1 day

## 2014-07-29 NOTE — Progress Notes (Signed)
UR completed 

## 2014-07-30 DIAGNOSIS — G458 Other transient cerebral ischemic attacks and related syndromes: Secondary | ICD-10-CM

## 2014-07-30 DIAGNOSIS — I517 Cardiomegaly: Secondary | ICD-10-CM

## 2014-07-30 LAB — GLUCOSE, CAPILLARY
GLUCOSE-CAPILLARY: 124 mg/dL — AB (ref 70–99)
GLUCOSE-CAPILLARY: 159 mg/dL — AB (ref 70–99)
Glucose-Capillary: 106 mg/dL — ABNORMAL HIGH (ref 70–99)

## 2014-07-30 MED ORDER — POTASSIUM CHLORIDE CRYS ER 20 MEQ PO TBCR
40.0000 meq | EXTENDED_RELEASE_TABLET | Freq: Once | ORAL | Status: AC
  Administered 2014-07-30: 40 meq via ORAL
  Filled 2014-07-30: qty 2

## 2014-07-30 NOTE — Progress Notes (Signed)
UR completed 

## 2014-07-30 NOTE — Progress Notes (Signed)
VASCULAR LAB PRELIMINARY  PRELIMINARY  PRELIMINARY  PRELIMINARY  Carotid duplex completed.    Preliminary report:  Right:  40-59% internal carotid artery stenosis.   Left:  1-39% ICA stenosis.  Bilateral:  Vertebral artery flow is antegrade.     Daniel Mays, RVT 07/30/2014, 11:45 AM

## 2014-07-30 NOTE — Progress Notes (Signed)
OT Cancellation Note  Patient Details Name: Daniel Mays MRN: [id number] DOB: 09/22/1941   Cancelled Treatment:    Reason Eval/Treat Not Completed: Patient at procedure or test/ unavailable   Earlie Raveling OTR/L [phone] 07/30/2014, 9:33 AM

## 2014-07-30 NOTE — Progress Notes (Signed)
Echocardiogram 2D Echocardiogram has been performed.  Dorothey Baseman 07/30/2014, 3:22 PM

## 2014-07-30 NOTE — Discharge Summary (Signed)
Physician Discharge Summary  Daniel Mays ZOX:[id number] DOB: 1941-06-25 DOA: 07/28/2014  PCP: Jerl Mina, MD  Admit date: 07/28/2014 Discharge date: 07/30/2014  Time spent: Less than 30 minutes  Recommendations for Outpatient Follow-up:  1. Dr. Jerl Mina, PCP in 3 days.  2. Recommend OP Vascular surgery consultation and follow up re Carotid Artery stenosis.  Discharge Diagnoses:  Active Problems:   Sleep apnea, obstructive   Coronary atherosclerosis of native coronary artery   TIA (transient ischemic attack)   Discharge Condition: Improved & Stable  Diet recommendation: Heart Healthy diet.  Filed Weights   07/28/14 2340 07/30/14 0519  Weight: 102.785 kg (226 lb 9.6 oz) 103.1 kg (227 lb 4.7 oz)    History of present illness:  73 year old male with history of CAD, CABG, HTN, OSA, HLD presented with dysarthria, left face weakness and confusion. Family noticed that when they came back from church around 1 PM on 10/25, they found him sitting in the porch with slurring of his speech, drooping of his left side of face, left arm was shaking and patient was mildly confused. Patient apparently was unsteady walking prior to symptoms. He stated chest pain on admission but denied ever having chest pain today.   Hospital Course:   1. ?? TIA: CT head negative for acute stroke. Neurology consulted and stroke workup initiated. MRI brain also negative for acute stroke but shows remote right MCA territory infarct. Completed stroke workup-2-D echo: LVEF 60-65 percent, carotid Dopplers: Right 40-59 percent ICA stenosis, left 1-39 percent ICA stenosis and vertebral artery flow is antegrade (discussed with vascular surgery on call-please see below), hemoglobin A1c: 6.4 and fasting lipids: LDL 54. Continue dual antiplatelet - aspirin and Plavix. PT recommends no follow-up. Symptoms have resolved. Stroke team evaluated again on 10/26: Patient informed the stroke team that he was shopping on the  day of admission and bent down to look for stuff, his belly against his chest which led to difficulty breathing and gasping for air. Subsequently he was found by his family with confusion and not able to talk with drooling. Neurology felt that the event was more related to respiratory distress and questioned whether this was truly TIA or just an episode related to respiratory distress. Patient is a poor historian and admitting history seems unclear. 2. Hypertension: Mildly uncontrolled. Continue home doses of losartan, Imdur and metoprolol. 3. History of CAD/CABG: Initially stated chest pain but subsequently stated that he never had chest pain. Troponins 3 negative. Continue aspirin, Plavix, beta blockers and statins. 2-D echo with normal LVEF. Patient denies dyspnea. 4. OSA: Apparently noncompliant with C Pap. This may be a reason for his dyspnea. 5. HLD: Continue statins. LDL at goal: 54 6. NSSVT & ? NSVT: Continue metoprolol. No further events on monitor. Magnesium 1.9 and potassium replaced prior to discharge. 7. Stage III chronic kidney disease: Creatinine stable. 8. History of dyslipidemia: Continue Zocor and Zetia. 9. Obesity 10. Carotid artery disease: Preliminary carotid Doppler results are as below. Discussed with vascular surgery M.D. on call who indicated that this could be followed up as an outpatient with a repeat carotid Doppler in 6 months.   Consultations:  Neurology  Procedures:  2-D echo 07/30/14: Study Conclusions  - Procedure narrative: Transthoracic echocardiography. Image quality was adequate. The study was technically difficult, as a result of poor sound wave transmission. - Left ventricle: The cavity size was normal. Wall thickness was increased in a pattern of mild LVH. Systolic function was normal. The estimated ejection fraction was  in the range of 60% to 65%. Wall motion was normal; there were no regional wall motion abnormalities. Doppler parameters are  consistent with abnormal left ventricular relaxation (grade 1 diastolic dysfunction). The E/e&' ratio is <8, suggesting normal LV filling pressure. - Aorta: Aortic root dimension: 40 mm (ED). - Aortic root: The aortic root is mildly dilated. - Tricuspid valve: There was trivial regurgitation. - Pulmonary arteries: PA peak pressure: 9 mm Hg (S).  Impressions:  - Compared to the prior echo in 02/2014, LV function has now Normalized.  Carotid duplex completed.  Preliminary report: Right: 40-59% internal carotid artery stenosis. Left: 1-39% ICA stenosis. Bilateral: Vertebral artery flow is antegrade.       Discharge Exam:  Complaints:  Denies complaints. Denies facial weakness, confusion or chest pain. Anxious to go home.  Filed Vitals:   07/30/14 0519 07/30/14 1005 07/30/14 1349 07/30/14 1655  BP: 163/85 154/57 113/75 134/65  Pulse: 70 66 59 65  Temp: 98.2 F (36.8 C) 98.3 F (36.8 C) 97.2 F (36.2 C)   TempSrc: Oral Oral Oral   Resp: 18 20 18 18   Height:      Weight: 103.1 kg (227 lb 4.7 oz)     SpO2: 96% 95% 99% 95%   General exam: Pleasant elderly male sitting comfortably in chair this morning.  Respiratory system: Clear. No increased work of breathing.  Cardiovascular system: S1 & S2 heard, RRR. No JVD, murmurs, gallops, clicks or pedal edema. telemetry: SR-SB in 50's. No arrythmia's. Gastrointestinal system: Abdomen is nondistended, soft and nontender. Normal bowel sounds heard.  Central nervous system: Alert and oriented. No focal neurological deficits.  Extremities: Symmetric 5 x 5 power.   Discharge Instructions      Discharge Instructions   Call MD for:  difficulty breathing, headache or visual disturbances    Complete by:  As directed      Diet - low sodium heart healthy    Complete by:  As directed      Increase activity slowly    Complete by:  As directed             Medication List         acetaminophen 500 MG tablet  Commonly known as:   TYLENOL  Take 500 mg by mouth 3 (three) times daily.     aspirin EC 81 MG tablet  Take 81 mg by mouth daily.     clopidogrel 75 MG tablet  Commonly known as:  PLAVIX  Take 75 mg by mouth daily.     ezetimibe 10 MG tablet  Commonly known as:  ZETIA  Take 10 mg by mouth at bedtime.     furosemide 40 MG tablet  Commonly known as:  LASIX  Take 40 mg by mouth daily. Takes 2 tablets     isosorbide mononitrate 60 MG 24 hr tablet  Commonly known as:  IMDUR  Take 1 tablet (60 mg total) by mouth daily.     losartan 50 MG tablet  Commonly known as:  COZAAR  Take 1 tablet (50 mg total) by mouth daily.     metoprolol 50 MG tablet  Commonly known as:  LOPRESSOR  Take 50 mg by mouth 2 (two) times daily.     pantoprazole 40 MG tablet  Commonly known as:  PROTONIX  Take 40 mg by mouth daily.     potassium chloride SA 20 MEQ tablet  Commonly known as:  K-DUR,KLOR-CON  Take 10-20 mEq by mouth at bedtime.  simvastatin 20 MG tablet  Commonly known as:  ZOCOR  Take 20 mg by mouth at bedtime.       Follow-up Information   Follow up with HEDRICK,JAMES, MD. Schedule an appointment as soon as possible for a visit in 3 days.   Specialty:  Family Medicine   Contact information:   [address] [facility] [place] [id number] [phone]        The results of significant diagnostics from this hospitalization (including imaging, microbiology, ancillary and laboratory) are listed below for reference.    Significant Diagnostic Studies: Dg Chest 2 View  07/28/2014   CLINICAL DATA:  Status post fall 07/22/2014.  EXAM: CHEST  2 VIEW  COMPARISON:  PA and lateral chest 06/01/2012.  FINDINGS: The lungs are clear. Heart size is normal. No pneumothorax or pleural effusion. The patient is status post CABG.  IMPRESSION: No acute disease.   Electronically Signed   By: Drusilla Kannerhomas  Dalessio M.D.   On: 07/28/2014 19:17   Ct Head (brain) Wo Contrast  07/28/2014   CLINICAL DATA:   Right-sided weakness and dizziness. History of prior infarct.  EXAM: CT HEAD WITHOUT CONTRAST  TECHNIQUE: Contiguous axial images were obtained from the base of the skull through the vertex without intravenous contrast.  COMPARISON:  None.  FINDINGS: The brain is atrophic with chronic microvascular ischemic change. Encephalomalacia in the right temporal lobe is identified consistent with prior infarct. No evidence of acute infarction, hemorrhage, mass lesion, mass effect, midline shift or abnormal extra-axial fluid collection is identified. There is no hydrocephalus or pneumocephalus. The calvarium is intact. Imaged paranasal sinuses and mastoid air cells are clear.  IMPRESSION: No acute abnormality.  Atrophy, chronic microvascular ischemic change and remote right temporal lobe infarct.   Electronically Signed   By: Drusilla Kannerhomas  Dalessio M.D.   On: 07/28/2014 17:43   Mr Maxine GlennMra Head Wo Contrast  07/29/2014   CLINICAL DATA:  Slurred speech.  Possible stroke.  EXAM: MRI HEAD WITHOUT CONTRAST  MRA HEAD WITHOUT CONTRAST  TECHNIQUE: Multiplanar, multiecho pulse sequences of the brain and surrounding structures were obtained without intravenous contrast. Angiographic images of the head were obtained using MRA technique without contrast.  COMPARISON:  None.  FINDINGS: MRI HEAD FINDINGS  The patient was unable to remain motionless for the exam. Small or subtle lesions could be overlooked.  No evidence for acute infarction, hemorrhage, or mass lesion. Moderate ventricular enlargement, related to central atrophy. Remote RIGHT MCA territory infarct affects the RIGHT temporal lobe primarily, also portions of the RIGHT frontal operculum. T2 and FLAIR hyperintensities throughout the periventricular and subcortical white matter a mild degree, likely chronic microvascular ischemic change. There is a focal extra-axial collection over the LEFT frontal lobe, 17 x 33 mm cross-section, consistent with a benign incidental arachnoid cyst. Flow  voids are maintained. No midline abnormality. No sinus or mastoid disease. RIGHT cataract extraction Good general agreement prior CT.  MRA HEAD FINDINGS  The LEFT internal carotid artery is widely patent. Mild non stenotic irregularity supraclinoid ICA on the RIGHT. ICA termini widely patent. The basilar artery is mildly irregular, with LEFT vertebral dominant. RIGHT vertebral is diminutive, and may be diseased in its proximal V4 segment. There is mild non stenotic irregularity of the M1 MCA on the RIGHT. Mild non stenotic irregularity of the mid M1 MCA on the LEFT. No flow reducing lesion of the ACA or PCA vessels. No definite cerebellar branch occlusion. No intracranial aneurysm  IMPRESSION: Motion degraded exam.  Chronic changes as described. No evidence for acute infarction or intracranial hemorrhage.  Remote RIGHT MCA territory infarct. Incidental LEFT frontal arachnoid cyst.  Other than suspected disease of the non dominant RIGHT vertebral, no proximal flow reducing lesion of the intracranial circulation.   Electronically Signed   By: Davonna Belling M.D.   On: 07/29/2014 08:53   Mr Brain Wo Contrast  07/29/2014   CLINICAL DATA:  Slurred speech.  Possible stroke.  EXAM: MRI HEAD WITHOUT CONTRAST  MRA HEAD WITHOUT CONTRAST  TECHNIQUE: Multiplanar, multiecho pulse sequences of the brain and surrounding structures were obtained without intravenous contrast. Angiographic images of the head were obtained using MRA technique without contrast.  COMPARISON:  None.  FINDINGS: MRI HEAD FINDINGS  The patient was unable to remain motionless for the exam. Small or subtle lesions could be overlooked.  No evidence for acute infarction, hemorrhage, or mass lesion. Moderate ventricular enlargement, related to central atrophy. Remote RIGHT MCA territory infarct affects the RIGHT temporal lobe primarily, also portions of the RIGHT frontal operculum. T2 and FLAIR hyperintensities throughout the periventricular and subcortical  white matter a mild degree, likely chronic microvascular ischemic change. There is a focal extra-axial collection over the LEFT frontal lobe, 17 x 33 mm cross-section, consistent with a benign incidental arachnoid cyst. Flow voids are maintained. No midline abnormality. No sinus or mastoid disease. RIGHT cataract extraction Good general agreement prior CT.  MRA HEAD FINDINGS  The LEFT internal carotid artery is widely patent. Mild non stenotic irregularity supraclinoid ICA on the RIGHT. ICA termini widely patent. The basilar artery is mildly irregular, with LEFT vertebral dominant. RIGHT vertebral is diminutive, and may be diseased in its proximal V4 segment. There is mild non stenotic irregularity of the M1 MCA on the RIGHT. Mild non stenotic irregularity of the mid M1 MCA on the LEFT. No flow reducing lesion of the ACA or PCA vessels. No definite cerebellar branch occlusion. No intracranial aneurysm  IMPRESSION: Motion degraded exam.  Chronic changes as described. No evidence for acute infarction or intracranial hemorrhage.  Remote RIGHT MCA territory infarct. Incidental LEFT frontal arachnoid cyst.  Other than suspected disease of the non dominant RIGHT vertebral, no proximal flow reducing lesion of the intracranial circulation.   Electronically Signed   By: Davonna Belling M.D.   On: 07/29/2014 08:53    Microbiology: No results found for this or any previous visit (from the past 240 hour(s)).   Labs: Basic Metabolic Panel:  Recent Labs Lab 07/28/14 1652 07/29/14 1846  NA 141 140  K 3.8 3.5*  CL 102 103  CO2 27 24  GLUCOSE 150* 165*  BUN 20 20  CREATININE 1.36* 1.24  CALCIUM 9.3 8.9  MG  --  1.9   Liver Function Tests:  Recent Labs Lab 07/28/14 1652  AST 27  ALT 24  ALKPHOS 44  BILITOT 0.3  PROT 7.7  ALBUMIN 4.1   No results found for this basename: LIPASE, AMYLASE,  in the last 168 hours No results found for this basename: AMMONIA,  in the last 168 hours CBC:  Recent Labs Lab  07/28/14 1652  WBC 8.6  NEUTROABS 5.4  HGB 13.7  HCT 41.5  MCV 89.1  PLT 278   Cardiac Enzymes:  Recent Labs Lab 07/29/14 0047 07/29/14 0831 07/29/14 1240  TROPONINI <0.30 <0.30 <0.30   BNP: BNP (last 3 results) No results found for this basename: PROBNP,  in the last 8760 hours CBG:  Recent Labs Lab 07/29/14 1150 07/29/14  1720 07/29/14 2028 07/30/14 0744 07/30/14 1142  GLUCAP 120* 121* 117* 106* 124*      Signed:  Marcellus Scott, MD, FACP, FHM. [facility] Pager [phone]  If 7PM-7AM, please contact night-coverage [email or web address] Password [email or web address] 07/30/2014, 5:09 PM

## 2014-08-01 ENCOUNTER — Other Ambulatory Visit: Payer: Self-pay | Admitting: *Deleted

## 2014-08-01 ENCOUNTER — Telehealth: Payer: Self-pay | Admitting: *Deleted

## 2014-08-01 MED ORDER — FUROSEMIDE 40 MG PO TABS: 40.0000 mg | ORAL_TABLET | Freq: Every day | ORAL | Status: DC

## 2014-08-01 NOTE — Telephone Encounter (Signed)
Spoke with patient's wife informing her that i sent in a new furosemide prescription with the corrected pill quantity. i also informed her that the patient should have followed  up with Dr. Tresa Endo in September. She tells me that he was in the hospital last weekend for a "mini stroke". She will call back to make appointment to see Dr. Tresa Endo.

## 2014-08-21 ENCOUNTER — Encounter: Payer: Self-pay | Admitting: Cardiovascular Disease

## 2014-08-21 ENCOUNTER — Ambulatory Visit (INDEPENDENT_AMBULATORY_CARE_PROVIDER_SITE_OTHER): Payer: Commercial Managed Care - HMO | Admitting: Cardiovascular Disease

## 2014-08-21 VITALS — BP 130/90 | HR 72 | Ht 67.0 in | Wt 235.0 lb

## 2014-08-21 DIAGNOSIS — E785 Hyperlipidemia, unspecified: Secondary | ICD-10-CM

## 2014-08-21 DIAGNOSIS — I2510 Atherosclerotic heart disease of native coronary artery without angina pectoris: Secondary | ICD-10-CM

## 2014-08-21 DIAGNOSIS — G459 Transient cerebral ischemic attack, unspecified: Secondary | ICD-10-CM

## 2014-08-21 DIAGNOSIS — E669 Obesity, unspecified: Secondary | ICD-10-CM

## 2014-08-21 DIAGNOSIS — G4733 Obstructive sleep apnea (adult) (pediatric): Secondary | ICD-10-CM

## 2014-08-21 MED ORDER — LOSARTAN POTASSIUM 50 MG PO TABS: ORAL_TABLET | ORAL | Status: DC

## 2014-08-21 NOTE — Patient Instructions (Signed)
Your physician has recommended you make the following change in your medication: increase the losartan to 1 & 1/2 tablet daily. This has already been sent to the pharmacy to reflect the change.  Increase the fish oil to 2 capsules daily. It is okay to take a extra dose of furosemide if needed for swelling.  Your physician wants you to follow-up in: 6 months or sooner if needed. You will receive a reminder letter in the mail two months in advance. If you don't receive a letter, please call our office to schedule the follow-up appointment.

## 2014-08-21 NOTE — Progress Notes (Signed)
Patient ID: Daniel Mays, male   DOB: 03-Jan-1941, 73 y.o.   MRN: [id number]     HPI: Daniel Mays is a 73 y.o. male who presents to the office today for a follow up evaluation following his recent hospitalization.   Daniel Mays has known CAD and in June 2007 suffered an inferior wall ST segment elevation myocardial infarction which was complicated by recurrent episodes of ventricular fibrillation requiring numerous to defibrillations, CPR, intra-aortic balloon pump insertion and pacemaker therapy. At that time, I performed successful PTCA of a totally occluded right coronary artery with restoration of TIMI-3 flow. Due to severe concomitant CAD I recommended elective CABG surgery which was done the following day by Dr. Dorris Fetch. He underwent CABG x5 with a LIMA to the LAD, a vein to the diagonal, vein to the intermediate, sequential vein to the PDA and PLA branch of the right coronary artery. In June 2009 he underwent 2 vessel intervention involving the LAD ostium and proximal intermediate vessel. Additional problems also include obstructive sleep apnea. He apparently has not used CPAP therapy for several years. In April 2014 he underwent a myocardial perfusion study which was low risk without significant scar or ischemia in only mild inferolateral thinning. Ejection fraction 61%. He had normal wall motion. An echo Doppler study on 02/27/2014, which showed an ejection fraction of 45-50% without wall motion abnormality.  Estimated pulmonary pressure was normal.   He has a history of hyperlipidemia, obesity, and mild renal artery stenosis on duplex imaging.  He has a history of complex sleep apnea, and had not been on CPAP therapy for several years. He  was referred for a follow-up sleep study on 07/20/2013. This was done in a split-night protocol due to severe sleep apnea. His overall AHI was 65.7 per hour. He was unable to reach REM sleep in the baseline portion of the study. Oxygen dropped  to 86% during non-REM sleep and has evidence for loud snoring. He was titrated to 13 cm water pressure but due to development of significant central events, BiPAP was started at 14/10 was increased to 15/11 cm. I last saw him, he had not yet been contacted by [facility] to initiate therapy but subsequently this has taken place. He apparently received an Airsense 10 ResMed unit on 10/26/2013.  When I last saw him, he was having difficulty in significant changes were made teamed.  I reduced his EPAP min to  8 but he had the potential to increase to 18/14 if necessary.  We also changed him to a heated coil hose for improved humidification.  Subsequently, he has been sleeping significantly better.  He denies residual daytime sleepiness.  He is unaware of breakthrough snoring.  Since I last saw him, he was admitted to [facility] hospital 07/28/2014 and was discharged 2 days later.  He presented with dysarthria, left face weakness, and confusion.  A CT of his head was negative for acute stroke.  Neurology was consulted.  An MRI of his brain was also negative for acute stroke but showed remote right MCA territory infarct.  A subsequent echo showed an EF of 60-65%.  Carotid Dopplers revealed a 40-59% internal carotid stenosis on the right and left 1 a 39% with antegrade flow.  Lipid studies showed an LDL cholesterol of 54 total cholesterol 120, triglycerides were elevated at 184 and HDL was low at 29.  He presents now for evaluation.  He admits to using his CPAP with 100% compliance.  There is some  mild leg swelling.  He denies any classic anginal type symptoms.    Past Medical History  Diagnosis Date  . Heart attack 2007  . Hypertension   . OSA (obstructive sleep apnea)   . Hyperlipidemia   . CAD (coronary artery disease)     2D ECHO, 03/17/2010 - EF 45-50%, normal    Past Surgical History  Procedure Laterality Date  . Coronary angioplasty with stent placement  2010  . Eye surgery  1987  . Exercise  stress test  01/18/2013    Small fixed inferolateral defect-artifact is favored. No ischemia  . Cardiac catheterization  04/01/2008    LAD ostium stented with a [id number] Promus DES stent reducing a 95% stenosis to 0%; Proximal intermediate Ramus stented with a [id number] Promus drug-eluting stent reducing a 85% stenosis to 0% residual  . Cardiac catheterization  03/26/2008    Increased medical management  . Cardiac catheterization  04/27/2006    CABG recommended  . Tee without cardioversion  04/29/2006  . Coronary artery bypass graft  04/29/2006    LIMA to LAD, SVG to first diagonal, SVG to ramus intermedius, SVG to PDA, and SVG to PLA branch of RCA    No Known Allergies  Current Outpatient Prescriptions  Medication Sig Dispense Refill  . acetaminophen (TYLENOL) 500 MG tablet Take 500 mg by mouth 3 (three) times daily.    Marland Kitchen. aspirin EC 81 MG tablet Take 81 mg by mouth daily.    . clopidogrel (PLAVIX) 75 MG tablet Take 75 mg by mouth daily.    Marland Kitchen. ezetimibe (ZETIA) 10 MG tablet Take 10 mg by mouth at bedtime.    . furosemide (LASIX) 40 MG tablet Take 1 tablet (40 mg total) by mouth daily. Takes 2 tablets 60 tablet 6  . isosorbide mononitrate (IMDUR) 60 MG 24 hr tablet Take 1 tablet (60 mg total) by mouth daily. 90 tablet 2  . losartan (COZAAR) 50 MG tablet Take 1 tablet (50 mg total) by mouth daily. 30 tablet 6  . metoprolol (LOPRESSOR) 50 MG tablet Take 50 mg by mouth 2 (two) times daily.    . pantoprazole (PROTONIX) 40 MG tablet Take 40 mg by mouth daily.    . potassium chloride SA (K-DUR,KLOR-CON) 20 MEQ tablet Take 10-20 mEq by mouth at bedtime.    . simvastatin (ZOCOR) 20 MG tablet Take 20 mg by mouth at bedtime.     No current facility-administered medications for this visit.    History   Social History  . Marital Status: Married    Spouse Name: N/A    Number of Children: N/A  . Years of Education: N/A   Occupational History  . Retired Scientist, research (medical)Mechanic/House mover    Social History Main  Topics  . Smoking status: Former Smoker -- 5.00 packs/day for 55 years    Types: Cigarettes    Start date: 10/04/1946    Quit date: 10/04/2001  . Smokeless tobacco: Never Used  . Alcohol Use: Yes     Comment: occ  . Drug Use: No  . Sexual Activity: Not on file   Other Topics Concern  . Not on file   Social History Narrative    Family History  Problem Relation Age of Onset  . Parkinson's disease Mother   . Diabetes Mother     ROS General: Negative; No fevers, chills, or night sweats HEENT: Negative; No changes in vision or hearing, sinus congestion, difficulty swallowing Pulmonary: Negative; No cough, wheezing, shortness of breath, hemoptysis Cardiovascular:  See HPI: No chest pain, presyncope, syncope, palpatations Positive for leg edema, which has improved, but still is present GI: Positive for GERD No nausea, vomiting, diarrhea, or abdominal pain GU: Negative; No dysuria, hematuria, or difficulty voiding Musculoskeletal: Negative; no myalgias, joint pain, or weakness Hematologic: Negative; no easy bruising, bleeding Endocrine: Negative; no heat/cold intolerance; no diabetes, Neuro: see history of present illness Skin: Negative; No rashes or skin lesions Psychiatric: Negative; No behavioral problems, depression Sleep: Positive for complex sleep apnea with current BiPAP settings no snoring,  daytime sleepiness, hypersomnolence, bruxism, restless legs, hypnogognic hallucinations. Other comprehensive 14 point system review is negative   Physical Exam BP 130/90 mmHg  Pulse 72  Ht 5\' 7"  (1.702 m)  Wt 235 lb (106.595 kg)  BMI 36.80 kg/m2 General: Alert, oriented, no distress.  Skin: normal turgor, no rashes, warm and dry HEENT: Normocephalic, atraumatic. Pupils equal round and reactive to light; sclera anicteric; extraocular muscles intact, No lid lag; Nose without nasal septal hypertrophy; Mouth/Parynx benign; Mallinpatti scale 3/4 Neck: No JVD, no carotid bruits; normal  carotid upstroke Lungs: clear to ausculatation and percussion bilaterally; no wheezing or rales, normal inspiratory and expiratory effort Chest wall: without tenderness to palpitation Heart: PMI not displaced, RRR, s1 s2 normal, 1/6systolic murmur, No diastolic murmur, no rubs, gallops, thrills, or heaves Abdomen: soft, nontender; no hepatosplenomehaly, BS+; abdominal aorta nontender and not dilated by palpation. Back: no CVA tenderness Pulses: 2+  Musculoskeletal: full range of motion, normal strength, no joint deformities Extremities: 1+ ankle edema bilaterally; Pulses 2+, no clubbing cyanosis, Homan's sign negative  Neurologic: grossly nonfocal; Cranial nerves grossly wnl Psychologic: Normal mood and affect   ECG (independently read by me): Normal sinus rhythm with first degree AV block with a PR interval at 210 ms.  No significant ST segment changes.  June 2015 ECG (independently read by me): Normal sinus rhythm at 68 beats per minute.  Mild first degree AV block with PR interval 208 ms.  Nonspecific ST-T change  LABS:  BMET    Component Value Date/Time   NA 140 07/29/2014 1846   NA 143 07/12/2013 0935   K 3.5* 07/29/2014 1846   CL 103 07/29/2014 1846   CO2 24 07/29/2014 1846   GLUCOSE 165* 07/29/2014 1846   GLUCOSE 98 07/12/2013 0935   BUN 20 07/29/2014 1846   BUN 18 07/12/2013 0935   CREATININE 1.24 07/29/2014 1846   CREATININE 1.39* 04/17/2014 0927   CALCIUM 8.9 07/29/2014 1846   GFRNONAA 55* 07/29/2014 1846   GFRAA 64* 07/29/2014 1846     Hepatic Function Panel     Component Value Date/Time   PROT 7.7 07/28/2014 1652   PROT 6.8 07/12/2013 0935   ALBUMIN 4.1 07/28/2014 1652   AST 27 07/28/2014 1652   ALT 24 07/28/2014 1652   ALKPHOS 44 07/28/2014 1652   BILITOT 0.3 07/28/2014 1652     CBC    Component Value Date/Time   WBC 8.6 07/28/2014 1652   WBC 8.9 07/12/2013 0935   RBC 4.66 07/28/2014 1652   RBC 4.78 07/12/2013 0935   HGB 13.7 07/28/2014 1652    HCT 41.5 07/28/2014 1652   PLT 278 07/28/2014 1652   MCV 89.1 07/28/2014 1652   MCH 29.4 07/28/2014 1652   MCH 29.1 07/12/2013 0935   MCHC 33.0 07/28/2014 1652   MCHC 33.3 07/12/2013 0935   RDW 13.6 07/28/2014 1652   RDW 13.8 07/12/2013 0935   LYMPHSABS 2.0 07/28/2014 1652   LYMPHSABS 2.3 07/12/2013 0935  MONOABS 0.7 07/28/2014 1652   EOSABS 0.5 07/28/2014 1652   EOSABS 0.4 07/12/2013 0935   BASOSABS 0.0 07/28/2014 1652   BASOSABS 0.0 07/12/2013 0935     BNP No results found for: PROBNP  Lipid Panel     Component Value Date/Time   CHOL 120 07/29/2014 0047   TRIG 184* 07/29/2014 0047   HDL 29* 07/29/2014 0047   HDL 38* 07/12/2013 0935   CHOLHDL 4.1 07/29/2014 0047   VLDL 37 07/29/2014 0047   LDLCALC 54 07/29/2014 0047   LDLCALC 66 07/12/2013 0935     RADIOLOGY: No results found.    ASSESSMENT AND PLAN: Daniel Mays is a 73 year old gentleman status post remote MI with cardiac arrest, requiring numerous defibrillations for ventricular fibrillation, CPR, IABP insertion,and  pacemaker therapy in 2007.  He is status post CABG revascularization surgery following successful PTCA to his totally occluded RCA with resumption of TIMI-3 flow for myocardial salvage prior to his current CABG surgery. The past, he had developed significant peripheral edema on amlodipine leading to its discontinuance.  He now is on losartan 50 mg, Lopressor 50 mg twice a day in addition to furosemide 40 mg daily for blood pressure control.  I am recommending slight additional titration of his losartan to 75 mg.  I also suggested that he can take 20 mg of furosemide in the late afternoon.  If recurrent edema develops.  He continues to be on dual antiplatelet therapy for his CAD, as well as possible TIA.  He is also taking fish oil.  I've suggested he increase this to 2 capsules daily in light of his elevated triglycerides.  He is not having any GERD symptoms and this seems to he stable on pantoprazole 40  mg.  He sees Dr. Burnett Sheng for primary care.  We discussed the importance of weight loss with his moderate obesity with a body mass index of 36.8.  I will see him in 6 months or cardiology reevaluation or sooner if problem arise.  Lennette Bihari, MD, [facility]  08/21/2014 5:08 PM

## 2014-08-27 ENCOUNTER — Other Ambulatory Visit: Payer: Self-pay | Admitting: Cardiovascular Disease

## 2014-08-27 NOTE — Telephone Encounter (Signed)
Rx was sent to pharmacy electronically. 

## 2014-09-28 ENCOUNTER — Other Ambulatory Visit: Payer: Self-pay | Admitting: Cardiovascular Disease

## 2014-09-30 NOTE — Telephone Encounter (Signed)
Rx(s) sent to pharmacy electronically.  

## 2014-10-09 ENCOUNTER — Telehealth (HOSPITAL_COMMUNITY): Payer: Self-pay | Admitting: *Deleted

## 2014-10-09 ENCOUNTER — Other Ambulatory Visit (HOSPITAL_COMMUNITY): Payer: Self-pay | Admitting: *Deleted

## 2014-10-22 ENCOUNTER — Telehealth: Payer: Self-pay | Admitting: Cardiovascular Disease

## 2014-10-22 NOTE — Telephone Encounter (Signed)
Closed encounter °

## 2014-11-01 ENCOUNTER — Other Ambulatory Visit (HOSPITAL_COMMUNITY): Payer: Self-pay | Admitting: Cardiovascular Disease

## 2014-11-01 DIAGNOSIS — I25709 Atherosclerosis of coronary artery bypass graft(s), unspecified, with unspecified angina pectoris: Secondary | ICD-10-CM

## 2014-11-04 ENCOUNTER — Other Ambulatory Visit: Payer: Self-pay | Admitting: Cardiovascular Disease

## 2014-11-04 NOTE — Telephone Encounter (Signed)
Rx refill sent to patient pharmacy   

## 2014-11-08 ENCOUNTER — Ambulatory Visit (HOSPITAL_COMMUNITY)
Admission: RE | Admit: 2014-11-08 | Discharge: 2014-11-08 | Disposition: A | Discharge status: Home / Self Care | Payer: Commercial Managed Care - HMO | Source: Ambulatory Visit | Attending: Cardiology | Admitting: Cardiology

## 2014-11-08 DIAGNOSIS — I25709 Atherosclerosis of coronary artery bypass graft(s), unspecified, with unspecified angina pectoris: Secondary | ICD-10-CM | POA: Insufficient documentation

## 2014-11-08 DIAGNOSIS — I701 Atherosclerosis of renal artery: Secondary | ICD-10-CM

## 2014-11-08 NOTE — Progress Notes (Signed)
Renal Duplex Completed. Stable left renal artery stenosis. Marilynne Halsted, BS, RDMS, RVT

## 2014-11-21 ENCOUNTER — Telehealth: Payer: Self-pay | Admitting: *Deleted

## 2014-11-21 NOTE — Telephone Encounter (Signed)
Faxed CPAP supply order to [facility]. 

## 2014-11-21 NOTE — Telephone Encounter (Signed)
Returned CPAP supply order to  [facility]. 

## 2014-12-05 ENCOUNTER — Ambulatory Visit: Payer: Self-pay | Admitting: Specialist

## 2015-02-05 ENCOUNTER — Other Ambulatory Visit: Payer: Self-pay | Admitting: Cardiovascular Disease

## 2015-02-05 NOTE — Telephone Encounter (Signed)
Rx(s) sent to pharmacy electronically.  

## 2015-03-13 ENCOUNTER — Other Ambulatory Visit: Payer: Self-pay | Admitting: Cardiovascular Disease

## 2015-03-13 NOTE — Telephone Encounter (Signed)
Rx(s) sent to pharmacy electronically.  

## 2015-04-10 ENCOUNTER — Encounter: Payer: Self-pay | Admitting: *Deleted

## 2015-04-14 NOTE — Discharge Instructions (Signed)

## 2015-04-16 ENCOUNTER — Encounter
Admission: RE | Disposition: A | Discharge status: Home / Self Care | Payer: Self-pay | Source: Ambulatory Visit | Attending: Ophthalmology

## 2015-04-16 ENCOUNTER — Ambulatory Visit: Payer: Commercial Managed Care - HMO | Admitting: Student in an Organized Health Care Education/Training Program

## 2015-04-16 ENCOUNTER — Ambulatory Visit
Admission: RE | Admit: 2015-04-16 | Discharge: 2015-04-16 | Disposition: A | Discharge status: Home / Self Care | Payer: Commercial Managed Care - HMO | Source: Ambulatory Visit | Attending: Ophthalmology | Admitting: Ophthalmology

## 2015-04-16 DIAGNOSIS — M7989 Other specified soft tissue disorders: Secondary | ICD-10-CM | POA: Insufficient documentation

## 2015-04-16 DIAGNOSIS — I10 Essential (primary) hypertension: Secondary | ICD-10-CM | POA: Diagnosis not present

## 2015-04-16 DIAGNOSIS — J449 Chronic obstructive pulmonary disease, unspecified: Secondary | ICD-10-CM | POA: Diagnosis not present

## 2015-04-16 DIAGNOSIS — I252 Old myocardial infarction: Secondary | ICD-10-CM | POA: Diagnosis not present

## 2015-04-16 DIAGNOSIS — G4730 Sleep apnea, unspecified: Secondary | ICD-10-CM | POA: Insufficient documentation

## 2015-04-16 DIAGNOSIS — E119 Type 2 diabetes mellitus without complications: Secondary | ICD-10-CM | POA: Diagnosis not present

## 2015-04-16 DIAGNOSIS — Z955 Presence of coronary angioplasty implant and graft: Secondary | ICD-10-CM | POA: Insufficient documentation

## 2015-04-16 DIAGNOSIS — R0601 Orthopnea: Secondary | ICD-10-CM | POA: Insufficient documentation

## 2015-04-16 DIAGNOSIS — K219 Gastro-esophageal reflux disease without esophagitis: Secondary | ICD-10-CM | POA: Insufficient documentation

## 2015-04-16 DIAGNOSIS — H2512 Age-related nuclear cataract, left eye: Secondary | ICD-10-CM | POA: Diagnosis present

## 2015-04-16 DIAGNOSIS — I2510 Atherosclerotic heart disease of native coronary artery without angina pectoris: Secondary | ICD-10-CM | POA: Insufficient documentation

## 2015-04-16 DIAGNOSIS — E780 Pure hypercholesterolemia: Secondary | ICD-10-CM | POA: Diagnosis not present

## 2015-04-16 DIAGNOSIS — N289 Disorder of kidney and ureter, unspecified: Secondary | ICD-10-CM | POA: Diagnosis not present

## 2015-04-16 DIAGNOSIS — Z951 Presence of aortocoronary bypass graft: Secondary | ICD-10-CM | POA: Diagnosis not present

## 2015-04-16 DIAGNOSIS — Z87891 Personal history of nicotine dependence: Secondary | ICD-10-CM | POA: Diagnosis not present

## 2015-04-16 DIAGNOSIS — H9190 Unspecified hearing loss, unspecified ear: Secondary | ICD-10-CM | POA: Insufficient documentation

## 2015-04-16 HISTORY — DX: Type 2 diabetes mellitus without complications: E11.9

## 2015-04-16 HISTORY — DX: Gastro-esophageal reflux disease without esophagitis: K21.9

## 2015-04-16 HISTORY — DX: Presence of dental prosthetic device (complete) (partial): Z97.2

## 2015-04-16 HISTORY — DX: Chronic kidney disease, unspecified: N18.9

## 2015-04-16 HISTORY — DX: Unspecified osteoarthritis, unspecified site: M19.90

## 2015-04-16 HISTORY — DX: Unspecified hearing loss, unspecified ear: H91.90

## 2015-04-16 HISTORY — PX: CATARACT EXTRACTION W/PHACO: SHX586

## 2015-04-16 SURGERY — PHACOEMULSIFICATION, CATARACT, WITH IOL INSERTION
Anesthesia: Monitor Anesthesia Care | Laterality: Left | Wound class: Clean

## 2015-04-16 MED ORDER — POVIDONE-IODINE 5 % OP SOLN
1.0000 "application " | OPHTHALMIC | Status: DC | PRN
  Administered 2015-04-16: 1 via OPHTHALMIC

## 2015-04-16 MED ORDER — MIDAZOLAM HCL 2 MG/2ML IJ SOLN
INTRAMUSCULAR | Status: DC | PRN
  Administered 2015-04-16: 1 mg via INTRAVENOUS

## 2015-04-16 MED ORDER — ARMC OPHTHALMIC DILATING GEL
1.0000 "application " | OPHTHALMIC | Status: DC | PRN
  Administered 2015-04-16: 1 via OPHTHALMIC

## 2015-04-16 MED ORDER — EPINEPHRINE HCL 1 MG/ML IJ SOLN
INTRAOCULAR | Status: DC | PRN
  Administered 2015-04-16: 95 mL via OPHTHALMIC

## 2015-04-16 MED ORDER — ACETAMINOPHEN 160 MG/5ML PO SOLN
325.0000 mg | ORAL | Status: DC | PRN
Start: 2015-04-16 — End: 2015-04-16

## 2015-04-16 MED ORDER — CEFUROXIME OPHTHALMIC INJECTION 1 MG/0.1 ML
INJECTION | OPHTHALMIC | Status: DC | PRN
  Administered 2015-04-16: .3 mL via INTRACAMERAL

## 2015-04-16 MED ORDER — BRIMONIDINE TARTRATE 0.2 % OP SOLN
OPHTHALMIC | Status: DC | PRN
  Administered 2015-04-16: 1 [drp] via OPHTHALMIC

## 2015-04-16 MED ORDER — TETRACAINE HCL 0.5 % OP SOLN
1.0000 [drp] | OPHTHALMIC | Status: DC | PRN
  Administered 2015-04-16: 1 [drp] via OPHTHALMIC

## 2015-04-16 MED ORDER — LACTATED RINGERS IV SOLN: INTRAVENOUS | Status: DC

## 2015-04-16 MED ORDER — ACETAMINOPHEN 325 MG PO TABS
325.0000 mg | ORAL_TABLET | ORAL | Status: DC | PRN
Start: 2015-04-16 — End: 2015-04-16

## 2015-04-16 MED ORDER — FENTANYL CITRATE (PF) 100 MCG/2ML IJ SOLN
INTRAMUSCULAR | Status: DC | PRN
  Administered 2015-04-16: 50 ug via INTRAVENOUS

## 2015-04-16 MED ORDER — TIMOLOL MALEATE 0.5 % OP SOLN
OPHTHALMIC | Status: DC | PRN
  Administered 2015-04-16: 1 [drp] via OPHTHALMIC

## 2015-04-16 MED ORDER — NA HYALUR & NA CHOND-NA HYALUR 0.4-0.35 ML IO KIT
PACK | INTRAOCULAR | Status: DC | PRN
  Administered 2015-04-16: 1 mL via INTRAOCULAR

## 2015-04-16 SURGICAL SUPPLY — 26 items
CANNULA ANT/CHMB 27GA (MISCELLANEOUS) ×3 IMPLANT
GLOVE SURG LX 7.5 STRW (GLOVE) ×2
GLOVE SURG LX STRL 7.5 STRW (GLOVE) ×1 IMPLANT
GLOVE SURG TRIUMPH 8.0 PF LTX (GLOVE) ×3 IMPLANT
GOWN STRL REUS W/ TWL LRG LVL3 (GOWN DISPOSABLE) ×2 IMPLANT
GOWN STRL REUS W/TWL LRG LVL3 (GOWN DISPOSABLE) ×4
LENS IOL ACRSF IQ PC 19.0 (Intraocular Lens) ×1 IMPLANT
LENS IOL ACRYSOF IQ POST 19.0 (Intraocular Lens) ×3 IMPLANT
MARKER SKIN SURG W/RULER VIO (MISCELLANEOUS) ×3 IMPLANT
NDL RETROBULBAR .5 NSTRL (NEEDLE) IMPLANT
NEEDLE FILTER BLUNT 18X 1/2SAF (NEEDLE) ×2
NEEDLE FILTER BLUNT 18X1 1/2 (NEEDLE) ×1 IMPLANT
PACK CATARACT BRASINGTON (MISCELLANEOUS) ×3 IMPLANT
PACK EYE AFTER SURG (MISCELLANEOUS) ×3 IMPLANT
PACK OPTHALMIC (MISCELLANEOUS) ×3 IMPLANT
RING MALYGIN 7.0 (MISCELLANEOUS) IMPLANT
SUT ETHILON 10-0 CS-B-6CS-B-6 (SUTURE)
SUT VICRYL  9 0 (SUTURE)
SUT VICRYL 9 0 (SUTURE) IMPLANT
SUTURE EHLN 10-0 CS-B-6CS-B-6 (SUTURE) IMPLANT
SYR 3ML LL SCALE MARK (SYRINGE) ×3 IMPLANT
SYR 5ML LL (SYRINGE) IMPLANT
SYR TB 1ML LUER SLIP (SYRINGE) ×3 IMPLANT
WATER STERILE IRR 250ML POUR (IV SOLUTION) ×3 IMPLANT
WATER STERILE IRR 500ML POUR (IV SOLUTION) IMPLANT
WIPE NON LINTING 3.25X3.25 (MISCELLANEOUS) ×3 IMPLANT

## 2015-04-16 NOTE — Anesthesia Preprocedure Evaluation (Signed)
Anesthesia Evaluation  Patient identified by MRN, date of birth, ID band  Reviewed: Allergy & Precautions, H&P , NPO status , Patient's Chart, lab work & pertinent test results  Airway Mallampati: II  TM Distance: >3 FB Neck ROM: full    Dental  (+) Edentulous Lower, Edentulous Upper   Pulmonary sleep apnea , former smoker,    Pulmonary exam normal       Cardiovascular hypertension, + CAD and + Past MI Rhythm:regular Rate:Normal     Neuro/Psych    GI/Hepatic   Endo/Other  diabetes  Renal/GU Renal disease     Musculoskeletal   Abdominal   Peds  Hematology   Anesthesia Other Findings   Reproductive/Obstetrics                             Anesthesia Physical Anesthesia Plan  ASA: III  Anesthesia Plan: MAC   Post-op Pain Management:    Induction:   Airway Management Planned:   Additional Equipment:   Intra-op Plan:   Post-operative Plan:   Informed Consent: I have reviewed the patients History and Physical, chart, labs and discussed the procedure including the risks, benefits and alternatives for the proposed anesthesia with the patient or authorized representative who has indicated his/her understanding and acceptance.     Plan Discussed with: CRNA  Anesthesia Plan Comments:         Anesthesia Quick Evaluation

## 2015-04-16 NOTE — Anesthesia Postprocedure Evaluation (Signed)
  Anesthesia Post-op Note  Patient: Daniel Mays  Procedure(s) Performed: Procedure(s) with comments: CATARACT EXTRACTION PHACO AND INTRAOCULAR LENS PLACEMENT (IOC) (Left) - CPAP DIABETIC  Anesthesia type:MAC  Patient location: PACU  Post pain: Pain level controlled  Post assessment: Post-op Vital signs reviewed, Patient's Cardiovascular Status Stable, Respiratory Function Stable, Patent Airway and No signs of Nausea or vomiting  Post vital signs: Reviewed and stable  Last Vitals:  Filed Vitals:   04/16/15 0956  BP: 128/81  Pulse: 54  Temp:   Resp: 16    Level of consciousness: awake, alert  and patient cooperative  Complications: No apparent anesthesia complications

## 2015-04-16 NOTE — H&P (Signed)
  The History and Physical notes were scanned in.  The patient remains stable and unchanged from the H&P.   Previous H&P reviewed, patient examined, and there are no changes.  Jonalyn Sedlak 04/16/2015 8:50 AM

## 2015-04-16 NOTE — Transfer of Care (Signed)
Immediate Anesthesia Transfer of Care Note  Patient: Daniel Mays  Procedure(s) Performed: Procedure(s) with comments: CATARACT EXTRACTION PHACO AND INTRAOCULAR LENS PLACEMENT (IOC) (Left) - CPAP DIABETIC  Patient Location: PACU  Anesthesia Type: MAC  Level of Consciousness: awake, alert  and patient cooperative  Airway and Oxygen Therapy: Patient Spontanous Breathing and Patient connected to supplemental oxygen  Post-op Assessment: Post-op Vital signs reviewed, Patient's Cardiovascular Status Stable, Respiratory Function Stable, Patent Airway and No signs of Nausea or vomiting  Post-op Vital Signs: Reviewed and stable  Complications: No apparent anesthesia complications

## 2015-04-16 NOTE — Op Note (Signed)
OPERATIVE NOTE  TYCE TRINER [id number] 04/16/2015   PREOPERATIVE DIAGNOSIS:  Nuclear sclerotic cataract left eye. H25.12   POSTOPERATIVE DIAGNOSIS:    Nuclear sclerotic cataract left eye.     PROCEDURE:  Phacoemusification with posterior chamber intraocular lens placement of the left eye   LENS:   Implant Name Type Inv. Item Serial No. Manufacturer Lot No. LRB No. Used  intraocular lens Intraocular Lens   [id number] ALCON   Left 1     [id number] 19.0 D   LTRASOUND TIME: 17  % of 1 minutes 42 seconds, CDE 17.1  SURGEON:  Deirdre Evener, MD   ANESTHESIA:  Topical with tetracaine drops and 2% Xylocaine jelly.   COMPLICATIONS:  None.   DESCRIPTION OF PROCEDURE:  The patient was identified in the holding room and transported to the operating room and placed in the supine position under the operating microscope.  The left eye was identified as the operative eye and it was prepped and draped in the usual sterile ophthalmic fashion.   A 1 millimeter clear-corneal paracentesis was made at the 1:30 position.  The anterior chamber was filled with Viscoat viscoelastic.  A 2.4 millimeter keratome was used to make a near-clear corneal incision at the 10:30 position.  .  A curvilinear capsulorrhexis was made with a cystotome and capsulorrhexis forceps.  Balanced salt solution was used to hydrodissect and hydrodelineate the nucleus.   Phacoemulsification was then used in stop and chop fashion to remove the lens nucleus and epinucleus.  The remaining cortex was then removed using the irrigation and aspiration handpiece. Provisc was then placed into the capsular bag to distend it for lens placement.  A lens was then injected into the capsular bag.  The remaining viscoelastic was aspirated.   Wounds were hydrated with balanced salt solution.  The anterior chamber was inflated to a physiologic pressure with balanced salt solution.  No wound leaks were noted. Cefuroxime 0.1 ml of a 10mg /ml  solution was injected into the anterior chamber for a dose of 1 mg of intracameral antibiotic at the completion of the case.   Timolol and Brimonidine drops were applied to the eye.  The patient was taken to the recovery room in stable condition without complications of anesthesia or surgery.  Mieke Brinley 04/16/2015, 9:49 AM

## 2015-04-17 ENCOUNTER — Encounter: Payer: Self-pay | Admitting: Ophthalmology

## 2015-05-06 ENCOUNTER — Other Ambulatory Visit: Payer: Self-pay | Admitting: Cardiovascular Disease

## 2015-05-06 NOTE — Telephone Encounter (Signed)
REFILL 

## 2015-06-23 ENCOUNTER — Other Ambulatory Visit: Payer: Self-pay | Admitting: Cardiovascular Disease

## 2015-06-23 NOTE — Telephone Encounter (Signed)
REFILL 

## 2015-06-28 ENCOUNTER — Other Ambulatory Visit: Payer: Self-pay | Admitting: Cardiovascular Disease

## 2015-06-30 NOTE — Telephone Encounter (Signed)
Rx request sent to pharmacy.  

## 2015-07-21 ENCOUNTER — Other Ambulatory Visit: Payer: Self-pay | Admitting: Cardiovascular Disease

## 2015-07-21 DIAGNOSIS — I701 Atherosclerosis of renal artery: Secondary | ICD-10-CM

## 2015-07-24 ENCOUNTER — Other Ambulatory Visit: Payer: Self-pay | Admitting: Cardiovascular Disease

## 2015-07-30 ENCOUNTER — Ambulatory Visit (HOSPITAL_COMMUNITY)
Admission: RE | Admit: 2015-07-30 | Discharge: 2015-07-30 | Disposition: A | Discharge status: Home / Self Care | Payer: Commercial Managed Care - HMO | Source: Ambulatory Visit | Attending: Cardiology | Admitting: Cardiology

## 2015-07-30 DIAGNOSIS — N189 Chronic kidney disease, unspecified: Secondary | ICD-10-CM | POA: Insufficient documentation

## 2015-07-30 DIAGNOSIS — E119 Type 2 diabetes mellitus without complications: Secondary | ICD-10-CM | POA: Diagnosis not present

## 2015-07-30 DIAGNOSIS — G4733 Obstructive sleep apnea (adult) (pediatric): Secondary | ICD-10-CM | POA: Insufficient documentation

## 2015-07-30 DIAGNOSIS — I701 Atherosclerosis of renal artery: Secondary | ICD-10-CM | POA: Diagnosis not present

## 2015-07-30 DIAGNOSIS — E785 Hyperlipidemia, unspecified: Secondary | ICD-10-CM | POA: Insufficient documentation

## 2015-07-30 DIAGNOSIS — I129 Hypertensive chronic kidney disease with stage 1 through stage 4 chronic kidney disease, or unspecified chronic kidney disease: Secondary | ICD-10-CM | POA: Diagnosis not present

## 2015-08-22 ENCOUNTER — Other Ambulatory Visit: Payer: Self-pay | Admitting: Cardiovascular Disease

## 2015-09-01 ENCOUNTER — Other Ambulatory Visit: Payer: Self-pay | Admitting: Cardiovascular Disease

## 2015-09-25 ENCOUNTER — Other Ambulatory Visit: Payer: Self-pay | Admitting: Cardiovascular Disease

## 2015-09-25 MED ORDER — CLOPIDOGREL BISULFATE 75 MG PO TABS: 75.0000 mg | ORAL_TABLET | Freq: Every day | ORAL | Status: DC

## 2015-09-25 MED ORDER — EZETIMIBE 10 MG PO TABS: 10.0000 mg | ORAL_TABLET | Freq: Every day | ORAL | Status: DC

## 2015-09-25 MED ORDER — SIMVASTATIN 20 MG PO TABS: 20.0000 mg | ORAL_TABLET | Freq: Every day | ORAL | Status: DC

## 2015-09-25 NOTE — Telephone Encounter (Signed)
Spoke to wife - appointment is made for 12/08/15 11:30 am Refilled 90 day supply,no refill until appointment Patient 's wife request 90 day supply  Wife verbalized understanding.

## 2015-11-18 DIAGNOSIS — J441 Chronic obstructive pulmonary disease with (acute) exacerbation: Secondary | ICD-10-CM | POA: Diagnosis not present

## 2015-11-18 DIAGNOSIS — I10 Essential (primary) hypertension: Secondary | ICD-10-CM | POA: Diagnosis not present

## 2015-11-18 DIAGNOSIS — Z125 Encounter for screening for malignant neoplasm of prostate: Secondary | ICD-10-CM | POA: Diagnosis not present

## 2015-11-18 DIAGNOSIS — E785 Hyperlipidemia, unspecified: Secondary | ICD-10-CM | POA: Diagnosis not present

## 2015-11-18 DIAGNOSIS — G4733 Obstructive sleep apnea (adult) (pediatric): Secondary | ICD-10-CM | POA: Diagnosis not present

## 2015-11-18 DIAGNOSIS — Z9989 Dependence on other enabling machines and devices: Secondary | ICD-10-CM | POA: Diagnosis not present

## 2015-11-19 DIAGNOSIS — Z125 Encounter for screening for malignant neoplasm of prostate: Secondary | ICD-10-CM | POA: Diagnosis not present

## 2015-11-19 DIAGNOSIS — E785 Hyperlipidemia, unspecified: Secondary | ICD-10-CM | POA: Diagnosis not present

## 2015-11-19 DIAGNOSIS — I10 Essential (primary) hypertension: Secondary | ICD-10-CM | POA: Diagnosis not present

## 2015-11-24 DIAGNOSIS — R05 Cough: Secondary | ICD-10-CM | POA: Diagnosis not present

## 2015-11-24 DIAGNOSIS — R509 Fever, unspecified: Secondary | ICD-10-CM | POA: Diagnosis not present

## 2015-12-07 ENCOUNTER — Other Ambulatory Visit: Payer: Self-pay | Admitting: Cardiology

## 2015-12-08 ENCOUNTER — Encounter: Payer: Self-pay | Admitting: Cardiovascular Disease

## 2015-12-08 ENCOUNTER — Ambulatory Visit (INDEPENDENT_AMBULATORY_CARE_PROVIDER_SITE_OTHER): Payer: PPO | Admitting: Cardiovascular Disease

## 2015-12-08 VITALS — BP 128/84 | HR 73 | Ht 67.0 in | Wt 229.0 lb

## 2015-12-08 DIAGNOSIS — I25810 Atherosclerosis of coronary artery bypass graft(s) without angina pectoris: Secondary | ICD-10-CM

## 2015-12-08 DIAGNOSIS — E785 Hyperlipidemia, unspecified: Secondary | ICD-10-CM | POA: Diagnosis not present

## 2015-12-08 DIAGNOSIS — E669 Obesity, unspecified: Secondary | ICD-10-CM

## 2015-12-08 DIAGNOSIS — G4733 Obstructive sleep apnea (adult) (pediatric): Secondary | ICD-10-CM

## 2015-12-08 DIAGNOSIS — I10 Essential (primary) hypertension: Secondary | ICD-10-CM | POA: Diagnosis not present

## 2015-12-08 DIAGNOSIS — I2510 Atherosclerotic heart disease of native coronary artery without angina pectoris: Secondary | ICD-10-CM

## 2015-12-08 NOTE — Patient Instructions (Signed)
Your physician wants you to follow-up in: 1 year or sooner if needed. You will receive a reminder letter in the mail two months in advance. If you don't receive a letter, please call our office to schedule the follow-up appointment.   If you need a refill on your cardiac medications before your next appointment, please call your pharmacy.   

## 2015-12-08 NOTE — Telephone Encounter (Signed)
Rx(s) sent to pharmacy electronically.  

## 2015-12-10 ENCOUNTER — Encounter: Payer: Self-pay | Admitting: Cardiovascular Disease

## 2015-12-10 NOTE — Progress Notes (Signed)
Patient ID: Daniel Mays, male   DOB: Nov 15, 1940, 75 y.o.   MRN: [id number]     HPI: Daniel Mays is a 75 y.o. male who presents to the office today for a 16 month follow up evaluation.   Daniel Mays has known CAD and in June 2007 suffered an inferior wall ST segment elevation myocardial infarction which was complicated by recurrent episodes of ventricular fibrillation requiring numerous to defibrillations, CPR, intra-aortic balloon pump insertion and pacemaker therapy. At that time, I performed successful PTCA of a otally occluded right coronary artery with restoration of TIMI-3 flow. Due to severe concomitant CAD I recommended elective CABG surgery which was done the following day by Dr. Roxan Hockey. He underwent CABG x5 with a LIMA to the LAD, a vein to the diagonal, vein to the intermediate, sequential vein to the PDA and PLA branch of the right coronary artery. In June 2009 he underwent 2 vessel intervention involving the LAD ostium and proximal intermediate vessel. In April 2014  a myocardial perfusion study was low risk without significant scar or ischemia in only mild inferolateral thinning. Ejection fraction 61%. He had normal wall motion. An echo Doppler study on 02/27/2014, which showed an ejection fraction of 45-50% without wall motion abnormality.  Estimated pulmonary pressure was normal.  He has a history of hyperlipidemia, obesity, and mild renal artery stenosis on duplex imaging.  He has a history of complex sleep apnea, and had not been on CPAP therapy for several years. He  was referred for a follow-up sleep study on 07/20/2013. This was done in a split-night protocol due to severe sleep apnea. His overall AHI was 65.7 per hour. He was unable to reach REM sleep in the baseline portion of the study. Oxygen dropped to 86% during non-REM sleep and has evidence for loud snoring. He was titrated to 13 cm water pressure but due to development of significant central events, BiPAP was  started at 14/10 was increased to 15/11 cm. He received an AirSense 10 ResMed unit on 10/26/2013.  When I last saw him, he was having difficulty in significant changes were made teamed.  I reduced his EPAP min to  8 but he had the potential to increase to 18/14 if necessary.  We also changed him to a heated coil hose for improved humidification.  Subsequently, he has been sleeping significantly better.  He denies residual daytime sleepiness.  He is unaware of breakthrough snoring.  Presently he only minutes to infrequent use.  He was admitted to [facility] hospital 07/28/2014 and was discharged 2 days later.  He presented with dysarthria, left face weakness, and confusion.  A CT of his head was negative for acute stroke.  Neurology was consulted.  An MRI of his brain was also negative for acute stroke but showed remote right MCA territory infarct.  A subsequent echo showed an EF of 60-65%.  Carotid Dopplers revealed a 40-59% internal carotid stenosis on the right and left 1 a 39% with antegrade flow.  Lipid studies showed an LDL cholesterol of 54 total cholesterol 120, triglycerides were elevated at 184 and HDL was low at 29.  He presents now for evaluation.   presently, he denies recent episodes of chest pain. He is unaware of palpitations. He states oftentimes he falls asleep without putting CPAP on. He had laboratory done by Dr. [place] Pink in [place] and was told that his labs "were good. "   He presents for evaluation.  Past Medical History  Diagnosis Date  .  Heart attack ([place]) 2007  . Hypertension   . Hyperlipidemia   . CAD (coronary artery disease)     2D ECHO, 03/17/2010 - EF 45-50%, normal  . OSA (obstructive sleep apnea)     CPAP  . GERD (gastroesophageal reflux disease)   . Diabetes mellitus without complication (HCC)     diet controlled  . Arthritis     knees  . Hard of hearing   . Wears dentures     full upper and lower  . Chronic kidney disease     blockage in renal artery     Past Surgical History  Procedure Laterality Date  . Eye surgery  1987  . Exercise stress test  01/18/2013    Small fixed inferolateral defect-artifact is favored. No ischemia  . Tee without cardioversion  04/29/2006  . Coronary artery bypass graft  04/29/2006    LIMA to LAD, SVG to first diagonal, SVG to ramus intermedius, SVG to PDA, and SVG to PLA branch of RCA  . Coronary angioplasty with stent placement  2010  . Cardiac catheterization  04/01/2008    LAD ostium stented with a 2.5x84m Promus DES stent reducing a 95% stenosis to 0%; Proximal intermediate Ramus stented with a [id number] drug-eluting stent reducing a 85% stenosis to 0% residual  . Cardiac catheterization  03/26/2008    Increased medical management  . Cardiac catheterization  04/27/2006    CABG recommended  . Cataract extraction w/phaco Left 04/16/2015    Procedure: CATARACT EXTRACTION PHACO AND INTRAOCULAR LENS PLACEMENT (IOC);  Surgeon: ChLeandrew KoyanagiMD;  [facility] Service: Ophthalmology;  Laterality: Left;  CPAP DIABETIC    No Known Allergies  Current Outpatient Prescriptions  Medication Sig Dispense Refill  . acetaminophen (TYLENOL) 500 MG tablet Take 500 mg by mouth 3 (three) times daily.    . Marland Kitchenlbuterol (PROAIR HFA) 108 (90 Base) MCG/ACT inhaler Inhale 90 mcg into the lungs.    . Marland Kitchenspirin EC 81 MG tablet Take 81 mg by mouth daily. AM    . clopidogrel (PLAVIX) 75 MG tablet Take 1 tablet (75 mg total) by mouth daily. 90 tablet 0  . doxycycline (VIBRA-TABS) 100 MG tablet Take 100 mg by mouth 2 (two) times daily.    . furosemide (LASIX) 40 MG tablet Take 2 tablets (80 mg total) by mouth daily. 180 tablet 0  . isosorbide mononitrate (IMDUR) 60 MG 24 hr tablet TAKE ONE TABLET BY MOUTH ONCE DAILY 90 tablet 0  . KLOR-CON M20 20 MEQ tablet TAKE ONE TABLET BY MOUTH ONCE DAILY 90 tablet 0  . losartan (COZAAR) 50 MG tablet Take 1 & 1/2 tablet daily 45 tablet 6  . metoprolol (LOPRESSOR) 50 MG  tablet TAKE ONE TABLET BY MOUTH TWICE DAILY 180 tablet 0  . Omega-3 Fatty Acids (FISH OIL PO) Take by mouth.    . potassium chloride SA (K-DUR,KLOR-CON) 20 MEQ tablet Take 10-20 mEq by mouth at bedtime.    . simvastatin (ZOCOR) 20 MG tablet Take 1 tablet (20 mg total) by mouth at bedtime. 90 tablet 0  . pantoprazole (PROTONIX) 40 MG tablet Take 1 tablet (40 mg total) by mouth daily. 90 tablet 3   No current facility-administered medications for this visit.    Social History   Social History  . Marital Status: Married    Spouse Name: N/A  . Number of Children: N/A  . Years of Education: N/A   Occupational History  . Retired [employer], learning disability  Social History Main Topics  . Smoking status: Former Smoker -- 5.00 packs/day for 55 years    Types: Cigarettes    Start date: 10/04/1946    Quit date: 10/04/2001  . Smokeless tobacco: Never Used  . Alcohol Use: 1.8 oz/week    3 Shots of liquor per week     Comment: occ  . Drug Use: No  . Sexual Activity: Not on file   Other Topics Concern  . Not on file   Social History Narrative    Family History  Problem Relation Age of Onset  . Parkinson's disease Mother   . Diabetes Mother     ROS General: Negative; No fevers, chills, or night sweats HEENT: Negative; No changes in vision or hearing, sinus congestion, difficulty swallowing Pulmonary: Negative; No cough, wheezing, shortness of breath, hemoptysis Cardiovascular: See HPI: No chest pain, presyncope, syncope, palpatations Positive for leg edema, which has improved, but still is present GI: Positive for GERD No nausea, vomiting, diarrhea, or abdominal pain GU: Negative; No dysuria, hematuria, or difficulty voiding Musculoskeletal: Negative; no myalgias, joint pain, or weakness Hematologic: Negative; no easy bruising, bleeding Endocrine: Negative; no heat/cold intolerance; no diabetes, Neuro: see history of present illness Skin: Negative; No rashes or skin  lesions Psychiatric: Negative; No behavioral problems, depression Sleep: Positive for complex sleep apnea with current BiPAP settings no snoring,  daytime sleepiness, hypersomnolence, bruxism, restless legs, hypnogognic hallucinations. Other comprehensive 14 point system review is negative   Physical Exam BP 128/84 mmHg  Pulse 73  Ht '5\' 7"'  (1.702 m)  Wt 229 lb (103.874 kg)  BMI 35.86 kg/m2   Wt Readings from Last 3 Encounters:  12/08/15 229 lb (103.874 kg)  04/16/15 230 lb (104.327 kg)  08/21/14 235 lb (106.595 kg)   General: Alert, oriented, no distress.  Skin: normal turgor, no rashes, warm and dry HEENT: Normocephalic, atraumatic. Pupils equal round and reactive to light; sclera anicteric; extraocular muscles intact, No lid lag; Nose without nasal septal hypertrophy; Mouth/Parynx benign; Mallinpatti scale 3/4 Neck: No JVD, no carotid bruits; normal carotid upstroke Lungs: clear to ausculatation and percussion bilaterally; no wheezing or rales, normal inspiratory and expiratory effort Chest wall: without tenderness to palpitation Heart: PMI not displaced, RRR, s1 s2 normal, 1/6systolic murmur, No diastolic murmur, no rubs, gallops, thrills, or heaves Abdomen: soft, nontender; no hepatosplenomehaly, BS+; abdominal aorta nontender and not dilated by palpation. Back: no CVA tenderness Pulses: 2+  Musculoskeletal: full range of motion, normal strength, no joint deformities Extremities: 1+ ankle edema bilaterally; Pulses 2+, no clubbing cyanosis, Homan's sign negative  Neurologic: grossly nonfocal; Cranial nerves grossly wnl Psychologic: Normal mood and affect  ECG (independently read by me):  Sinus rhythm with an occasional PVC. Nonspecific ST changes. Borderline first-degree AV block  Novembr 2015 ECG (independently read by me): Normal sinus rhythm with first degree AV block with a PR interval at 210 ms.  No significant ST segment changes.  June 2015 ECG (independently read by  me): Normal sinus rhythm at 68 beats per minute.  Mild first degree AV block with PR interval 208 ms.  Nonspecific ST-T change  LABS:  BMP Latest Ref Rng 07/29/2014 07/28/2014 04/17/2014  Glucose 70 - 99 mg/dL 165(H) 150(H) 92  BUN 6 - 23 mg/dL '20 20 22  ' Creatinine 0.50 - 1.35 mg/dL 1.24 1.36(H) 1.39(H)  BUN/Creat Ratio 10 - 22 - - -  Sodium 137 - 147 mEq/L 140 141 143  Potassium 3.7 - 5.3 mEq/L 3.5(L) 3.8 4.7  Chloride 96 - 112 mEq/L 103 102 105  CO2 19 - 32 mEq/L '24 27 29  ' Calcium 8.4 - 10.5 mg/dL 8.9 9.3 9.6   Hepatic Function Latest Ref Rng 07/28/2014 04/17/2014 07/12/2013  Total Protein 6.0 - 8.3 g/dL 7.7 7.0 6.8  Albumin 3.5 - 5.2 g/dL 4.1 4.5 4.5  AST 0 - 37 U/L '27 23 24  ' ALT 0 - 53 U/L '24 23 21  ' Alk Phosphatase 39 - 117 U/L 44 47 45  Total Bilirubin 0.3 - 1.2 mg/dL 0.3 0.4 0.3   CBC Latest Ref Rng 07/28/2014 04/17/2014 07/12/2013  WBC 4.0 - 10.5 K/uL 8.6 7.3 8.9  Hemoglobin 13.0 - 17.0 g/dL 13.7 14.5 13.9  Hematocrit 39.0 - 52.0 % 41.5 42.7 41.8  Platelets 150 - 400 K/uL 278 282 268   Lab Results  Component Value Date   MCV 89.1 07/28/2014   MCV 85.6 04/17/2014   MCV 87 07/12/2013    Lab Results  Component Value Date   TSH 1.357 04/17/2014   Lab Results  Component Value Date   HGBA1C 6.4* 07/28/2014   Lipid Panel     Component Value Date/Time   CHOL 120 07/29/2014 0047   CHOL 146 07/12/2013 0935   TRIG 184* 07/29/2014 0047   HDL 29* 07/29/2014 0047   HDL 38* 07/12/2013 0935   CHOLHDL 4.1 07/29/2014 0047   VLDL 37 07/29/2014 0047   LDLCALC 54 07/29/2014 0047   LDLCALC 66 07/12/2013 0935   RADIOLOGY: No results found.    ASSESSMENT AND PLAN: Mr. Jiminez is a 75 year old gentleman who is status post remote MI with cardiac arrest, requiring numerous defibrillations for ventricular fibrillation, CPR, IABP insertion,and  pacemaker therapy in 2007.  He is status post CABG revascularization surgery following successful PTCA to his totally occluded RCA with  resumption of TIMI-3 flow for myocardial salvage prior to his current CABG surgery. Remotely, he had developed significant peripheral edema on amlodipine leading to its discontinuance.  He now is on losartan 75 mg, Lopressor 50 mg twice a day in addition to furosemide 80 mg daily for blood pressure control.  He also takes isosorbide 60 mg.  He's not having anginal symptoms.  His blood pressure today is improved and is normal on his increased regimen which was titrated at his last office visit. He continues to be on dual antiplatelet therapy for his CAD, as well as possible TIA.  He is also taking fish oil. He is not having any GERD symptoms and this seems to he stable on pantoprazole 40 mg.  He sees Dr. Kary Kos for primary care.  We discussed the importance of weight loss with his moderate obesity with a body mass index of 35.86.  I had a long discussion with him concerning the importance of using his CPAP daily.  He has severe obstructive sleep apnea and discussed adverse consequences if left untreated.  He will be following up with Dr. Kary Kos. I will see him in one year for follow up evaluation or sooner if problems arise.  Time spent: 25 minutes  Troy Sine, MD, [facility]  12/10/2015 11:43 AM

## 2015-12-31 ENCOUNTER — Other Ambulatory Visit: Payer: Self-pay | Admitting: Cardiovascular Disease

## 2015-12-31 NOTE — Telephone Encounter (Signed)
REFILL 

## 2016-01-16 ENCOUNTER — Other Ambulatory Visit: Payer: Self-pay | Admitting: Cardiovascular Disease

## 2016-01-16 NOTE — Telephone Encounter (Signed)
REFILL 

## 2016-02-02 ENCOUNTER — Other Ambulatory Visit: Payer: Self-pay | Admitting: Cardiovascular Disease

## 2016-02-02 NOTE — Telephone Encounter (Signed)
REFILL 

## 2016-03-17 DIAGNOSIS — R413 Other amnesia: Secondary | ICD-10-CM | POA: Diagnosis not present

## 2016-03-17 DIAGNOSIS — G4733 Obstructive sleep apnea (adult) (pediatric): Secondary | ICD-10-CM | POA: Diagnosis not present

## 2016-03-17 DIAGNOSIS — Z9989 Dependence on other enabling machines and devices: Secondary | ICD-10-CM | POA: Diagnosis not present

## 2016-03-17 DIAGNOSIS — R5381 Other malaise: Secondary | ICD-10-CM | POA: Diagnosis not present

## 2016-03-17 DIAGNOSIS — R5383 Other fatigue: Secondary | ICD-10-CM | POA: Diagnosis not present

## 2016-03-17 DIAGNOSIS — R51 Headache: Secondary | ICD-10-CM | POA: Diagnosis not present

## 2016-03-17 DIAGNOSIS — N289 Disorder of kidney and ureter, unspecified: Secondary | ICD-10-CM | POA: Diagnosis not present

## 2016-03-26 DIAGNOSIS — E538 Deficiency of other specified B group vitamins: Secondary | ICD-10-CM | POA: Diagnosis not present

## 2016-03-28 ENCOUNTER — Other Ambulatory Visit: Payer: Self-pay | Admitting: Cardiovascular Disease

## 2016-03-29 NOTE — Telephone Encounter (Signed)
Rx(s) sent to pharmacy electronically.  

## 2016-04-06 ENCOUNTER — Other Ambulatory Visit: Payer: Self-pay | Admitting: Cardiovascular Disease

## 2016-04-16 DIAGNOSIS — E538 Deficiency of other specified B group vitamins: Secondary | ICD-10-CM | POA: Diagnosis not present

## 2016-04-23 DIAGNOSIS — E538 Deficiency of other specified B group vitamins: Secondary | ICD-10-CM | POA: Diagnosis not present

## 2016-05-21 DIAGNOSIS — E538 Deficiency of other specified B group vitamins: Secondary | ICD-10-CM | POA: Diagnosis not present

## 2016-07-06 ENCOUNTER — Other Ambulatory Visit: Payer: Self-pay | Admitting: Cardiovascular Disease

## 2016-07-06 NOTE — Telephone Encounter (Signed)
Rx request sent to pharmacy.  

## 2016-07-20 DIAGNOSIS — G309 Alzheimer's disease, unspecified: Secondary | ICD-10-CM | POA: Diagnosis not present

## 2016-07-20 DIAGNOSIS — F0150 Vascular dementia without behavioral disturbance: Secondary | ICD-10-CM | POA: Insufficient documentation

## 2016-07-20 DIAGNOSIS — F0280 Dementia in other diseases classified elsewhere without behavioral disturbance: Secondary | ICD-10-CM | POA: Diagnosis not present

## 2016-07-20 DIAGNOSIS — E669 Obesity, unspecified: Secondary | ICD-10-CM | POA: Insufficient documentation

## 2017-01-01 ENCOUNTER — Other Ambulatory Visit: Payer: Self-pay | Admitting: Cardiovascular Disease

## 2017-01-04 DIAGNOSIS — J4520 Mild intermittent asthma, uncomplicated: Secondary | ICD-10-CM | POA: Diagnosis not present

## 2017-01-04 DIAGNOSIS — R0602 Shortness of breath: Secondary | ICD-10-CM | POA: Diagnosis not present

## 2017-01-04 DIAGNOSIS — G4733 Obstructive sleep apnea (adult) (pediatric): Secondary | ICD-10-CM | POA: Diagnosis not present

## 2017-01-12 ENCOUNTER — Ambulatory Visit (INDEPENDENT_AMBULATORY_CARE_PROVIDER_SITE_OTHER): Payer: PPO | Admitting: Physician Assistant

## 2017-01-12 ENCOUNTER — Encounter: Payer: Self-pay | Admitting: Physician Assistant

## 2017-01-12 VITALS — BP 118/84 | HR 57 | Ht 67.0 in | Wt 224.0 lb

## 2017-01-12 DIAGNOSIS — E785 Hyperlipidemia, unspecified: Secondary | ICD-10-CM | POA: Diagnosis not present

## 2017-01-12 DIAGNOSIS — E119 Type 2 diabetes mellitus without complications: Secondary | ICD-10-CM | POA: Diagnosis not present

## 2017-01-12 DIAGNOSIS — R0602 Shortness of breath: Secondary | ICD-10-CM

## 2017-01-12 DIAGNOSIS — N189 Chronic kidney disease, unspecified: Secondary | ICD-10-CM

## 2017-01-12 DIAGNOSIS — Z9989 Dependence on other enabling machines and devices: Secondary | ICD-10-CM

## 2017-01-12 DIAGNOSIS — I25810 Atherosclerosis of coronary artery bypass graft(s) without angina pectoris: Secondary | ICD-10-CM

## 2017-01-12 DIAGNOSIS — I10 Essential (primary) hypertension: Secondary | ICD-10-CM | POA: Diagnosis not present

## 2017-01-12 DIAGNOSIS — I779 Disorder of arteries and arterioles, unspecified: Secondary | ICD-10-CM | POA: Diagnosis not present

## 2017-01-12 DIAGNOSIS — I739 Peripheral vascular disease, unspecified: Secondary | ICD-10-CM

## 2017-01-12 DIAGNOSIS — G4733 Obstructive sleep apnea (adult) (pediatric): Secondary | ICD-10-CM

## 2017-01-12 LAB — BASIC METABOLIC PANEL WITH GFR
BUN: 22 mg/dL (ref 7–25)
CO2: 29 mmol/L (ref 20–31)
Calcium: 8.8 mg/dL (ref 8.6–10.3)
Chloride: 106 mmol/L (ref 98–110)
Creat: 1.46 mg/dL — ABNORMAL HIGH (ref 0.70–1.18)
GFR, EST AFRICAN AMERICAN: 53 mL/min — AB (ref >=60)
GFR, EST NON AFRICAN AMERICAN: 45 mL/min — AB (ref >=60)
GLUCOSE: 111 mg/dL — AB (ref 65–99)
POTASSIUM: 4.7 mmol/L (ref 3.5–5.3)
Sodium: 144 mmol/L (ref 135–146)

## 2017-01-12 MED ORDER — FUROSEMIDE 40 MG PO TABS: ORAL_TABLET | ORAL | 0 refills | Status: DC

## 2017-01-12 MED ORDER — POTASSIUM CHLORIDE CRYS ER 20 MEQ PO TBCR: EXTENDED_RELEASE_TABLET | ORAL | 0 refills | Status: DC

## 2017-01-12 NOTE — Progress Notes (Signed)
Cardiology Office Note    Date:  01/13/2017   ID:  Daniel Mays, DOB 12-May-1941, MRN [id number]  PCP:  Jerl Mina, MD  Cardiologist:  Dr. Tresa Endo  Chief Complaint  Patient presents with  . Follow-up    seen for Dr Tresa Endo, no energy and shortness of breath-seen a pumlonologist, no chest pain    History of Present Illness:  Daniel Mays is a 76 y.o. male HTN, HLD, DM II, OSA on CPAP, remote CVA, carotid artery disease, and CAD s/p CABG x5. He suffered an inferior STEMI in 2017 that was complicated by recurrent episode of ventricular fibrillation requiring numerous defibrillation, CPR, intra-aortic balloon pump insertion and pacemaker therapy. He had successful PTCA of totally occluded RCA. Due to severe concomitant CAD, bypass surgery was recommended. He eventually underwent CABG 5 with LIMA to LAD, SVG to diagonal, SVG to intermediate, sequential SVG to PDA and the PLA branch of the RCA. In June I, he underwent PCI of ost LAD and proximal intermediate vessel. He had up Myoview study in April 2014 that was low risk without significant scar or ischemia, only mild inferolateral thinning. EF 61%. Last echocardiogram obtained on 02/27/2014 showed EF 45-50% without wall motion abnormality. He also had a history of mild renal artery stenosis seen on duplex. He was admitted in October 2015 with strokelike symptoms. CT of the head was negative. MRI of the brain also was negative for acute stroke but showed remote right MCA territory infarct. Echocardiogram showed normal EF 60-65%. Carotid Doppler showed 40-59% internal carotid artery disease of the right and 1-39% on the left.  Remotely, he has tried amlodipine, however this gave him significant peripheral edema leading to its discontinuation. He presents today for 1 year follow-up. He says his main issue is shortness breath which he has been noticing for the past several weeks. He actually had a bronchitis episode a few weeks back. He has been  seen by Dr. Meredeth Ide in [facility] with pulmonology service. He was treated with a course of antibiotic and also steroid therapy. He says he was responding to the steroid therapy quite well and noticed in significant amount of improvement in his breathing. However as soon as he came off the steroid therapy, he became more short of breath. During today's visit, his O2 saturation is 94% at rest. He says he mainly noticed the shortness of breath with exertion. Also he has been sleeping on the recliner for a long time due to orthopnea with laying down. On physical exam he only has trace amount of lower extremity edema more so on the right side. He also has intermittent crackle in the base of his lung, and not entirely sure if this represent cardiac or pulmonary etiology. I wanted to a trial to increase his Lasix dose. Although our records indicate he takes 2 tablet of 40 mg Lasix a day, according to his wife, he actually has been taking 40 mg once a day. He also noticed decreased urinary output recently as well. I will obtain BMET and BNP today and also repeat a basic metabolic panel in one week. I recommended him to increase his Lasix to 40 mg twice a day for the next 3 days and also increased the potassium supplement twice a day as well for the next 3 days before going back to the previous dose. I will see him back in 2 weeks to further assess. He does not appear to be significantly volume overloaded. I did check with the  family to see what was his symptom prior to his bypass surgery. According to his wife he never truly had any chest pain and has always been shortness of breath, however patient also says this episodes of shortness breath does not feel like what he experienced before prior to the bypass surgery. He could not tell me why though. The fact that he responded quite well to steroid make me think this is likely pulmonary in nature.   Past Medical History:  Diagnosis Date  . Arthritis    knees  . CAD  (coronary artery disease)    2D ECHO, 03/17/2010 - EF 45-50%, normal  . Chronic kidney disease    blockage in renal artery  . Diabetes mellitus without complication (HCC)    diet controlled  . GERD (gastroesophageal reflux disease)   . Hard of hearing   . Heart attack 2007  . Hyperlipidemia   . Hypertension   . OSA (obstructive sleep apnea)    CPAP  . Wears dentures    full upper and lower    Past Surgical History:  Procedure Laterality Date  . CARDIAC CATHETERIZATION  04/01/2008   LAD ostium stented with a 2.5x72mm Promus DES stent reducing a 95% stenosis to 0%; Proximal intermediate Ramus stented with a 2.75x25mm Promus drug-eluting stent reducing a 85% stenosis to 0% residual  . CARDIAC CATHETERIZATION  03/26/2008   Increased medical management  . CARDIAC CATHETERIZATION  04/27/2006   CABG recommended  . CATARACT EXTRACTION W/PHACO Left 04/16/2015   Procedure: CATARACT EXTRACTION PHACO AND INTRAOCULAR LENS PLACEMENT (IOC);  Surgeon: Lockie Mola, MD;  Location: [facility];  Service: Ophthalmology;  Laterality: Left;  CPAP DIABETIC  . CORONARY ANGIOPLASTY WITH STENT PLACEMENT  2010  . CORONARY ARTERY BYPASS GRAFT  04/29/2006   LIMA to LAD, SVG to first diagonal, SVG to ramus intermedius, SVG to PDA, and SVG to PLA branch of RCA  . EXERCISE STRESS TEST  01/18/2013   Small fixed inferolateral defect-artifact is favored. No ischemia  . EYE SURGERY  1987  . TEE WITHOUT CARDIOVERSION  04/29/2006    Current Medications: Outpatient Medications Prior to Visit  Medication Sig Dispense Refill  . acetaminophen (TYLENOL) 500 MG tablet Take 500 mg by mouth 3 (three) times daily.    Marland Kitchen albuterol (PROAIR HFA) 108 (90 Base) MCG/ACT inhaler Inhale 90 mcg into the lungs.    Marland Kitchen aspirin EC 81 MG tablet Take 81 mg by mouth daily. AM    . clopidogrel (PLAVIX) 75 MG tablet Take 1 tablet (75 mg total) by mouth daily. 30 tablet 11  . isosorbide mononitrate (IMDUR) 60 MG 24 hr tablet TAKE  ONE TABLET BY MOUTH ONCE DAILY 30 tablet 0  . metoprolol (LOPRESSOR) 50 MG tablet TAKE ONE TABLET BY MOUTH TWICE DAILY 30 tablet 0  . Omega-3 Fatty Acids (FISH OIL PO) Take by mouth.    . pantoprazole (PROTONIX) 40 MG tablet Take 1 tablet (40 mg total) by mouth daily. 90 tablet 3  . simvastatin (ZOCOR) 20 MG tablet TAKE ONE TABLET BY MOUTH AT BEDTIME 90 tablet 3  . furosemide (LASIX) 40 MG tablet TAKE TWO TABLETS BY MOUTH ONCE DAILY 180 tablet 3  . potassium chloride SA (K-DUR,KLOR-CON) 20 MEQ tablet TAKE ONE TABLET BY MOUTH ONCE DAILY 90 tablet 3  . losartan (COZAAR) 50 MG tablet Take 1 & 1/2 tablet daily (Patient not taking: Reported on 01/12/2017) 45 tablet 6   No facility-administered medications prior to visit.  Allergies:   Patient has no known allergies.   Social History   Social History  . Marital status: Married    Spouse name: N/A  . Number of children: N/A  . Years of education: N/A   Occupational History  . Retired Scientist, research (medical)    Social History Main Topics  . Smoking status: Former Smoker    Packs/day: 5.00    Years: 55.00    Types: Cigarettes    Start date: 10/04/1946    Quit date: 10/04/2001  . Smokeless tobacco: Never Used  . Alcohol use 1.8 oz/week    3 Shots of liquor per week     Comment: occ  . Drug use: No  . Sexual activity: Not Asked   Other Topics Concern  . None   Social History Narrative  . None     Family History:  The patient's family history includes Diabetes in his mother; Parkinson's disease in his mother.   ROS:   Please see the history of present illness.    ROS All other systems reviewed and are negative.   PHYSICAL EXAM:   VS:  BP 118/84   Pulse (!) 57   Ht 5\' 7"  (1.702 m)   Wt 224 lb (101.6 kg)   BMI 35.08 kg/m    GEN: Well nourished, well developed, in no acute distress  HEENT: normal  Neck: no JVD, carotid bruits, or masses Cardiac: RRR; no murmurs, rubs, or gallops. RLE 1+ edema  Respiratory:  clear to  auscultation bilaterally, normal work of breathing +bibasilar crackle GI: soft, nontender, nondistended, + BS MS: no deformity or atrophy  Skin: warm and dry, no rash Neuro:  Alert and Oriented x 3, Strength and sensation are intact Psych: euthymic mood, full affect  Wt Readings from Last 3 Encounters:  01/12/17 224 lb (101.6 kg)  12/08/15 229 lb (103.9 kg)  04/16/15 230 lb (104.3 kg)      Studies/Labs Reviewed:   EKG:  EKG is ordered today.  The ekg ordered today demonstrates Normal sinus rhythm with persistent T-wave inversion in lead 1 and aVL, unchanged compared to 2017  Recent Labs: 01/12/2017: BUN 22; Creat 1.46; Potassium 4.7; Sodium 144   Lipid Panel    Component Value Date/Time   CHOL 120 07/29/2014 0047   CHOL 146 07/12/2013 0935   TRIG 184 (H) 07/29/2014 0047   HDL 29 (L) 07/29/2014 0047   HDL 38 (L) 07/12/2013 0935   CHOLHDL 4.1 07/29/2014 0047   VLDL 37 07/29/2014 0047   LDLCALC 54 07/29/2014 0047   LDLCALC 66 07/12/2013 0935    Additional studies/ records that were reviewed today include:   Myoview 01/18/2013 Overall Impression:  Low risk stress nuclear study. Small fixed inferolateral defect -artifact is favored. No ischemia.  LV Wall Motion:  NL LV Function; NL Wall Motion; EF 61%.   Echo 07/30/2014 LV EF: 60% -  65%  Study Conclusions  - Procedure narrative: Transthoracic echocardiography. Image quality was adequate. The study was technically difficult, as a result of poor sound wave transmission. - Left ventricle: The cavity size was normal. Wall thickness was increased in a pattern of mild LVH. Systolic function was normal. The estimated ejection fraction was in the range of 60% to 65%. Wall motion was normal; there were no regional wall motion abnormalities. Doppler parameters are consistent with abnormal left ventricular relaxation (grade 1 diastolic dysfunction). The E/e&' ratio is <8, suggesting normal LV filling  pressure. - Aorta: Aortic root dimension: 40 mm (  ED). - Aortic root: The aortic root is mildly dilated. - Tricuspid valve: There was trivial regurgitation. - Pulmonary arteries: PA peak pressure: 9 mm Hg (S).  Impressions:  - Compared to the prior echo in 02/2014, LV function has now normalized.   Renal artery [place] 07/30/2015  Normal caliber abdominal aorta. Normal and symmetrical kidney size. 1-59% right renal artery stenosis > 60% left renal artery stenosis >70% celiac stenosis > 70% SMA stenosis  ASSESSMENT:    1. Shortness of breath   2. Chronic kidney disease, unspecified CKD stage   3. Essential hypertension   4. Hyperlipidemia, unspecified hyperlipidemia type   5. Controlled type 2 diabetes mellitus without complication, without long-term current use of insulin (HCC)   6. OSA on CPAP   7. Coronary artery disease involving coronary bypass graft of native heart without angina pectoris   8. Bilateral carotid artery disease (HCC)      PLAN:  In order of problems listed above:  1. Dyspnea: Mainly noticed on exertion, apparently treated with a course of steroid therapy recently with significant improvement, however as soon as he stops, his shortness of breath came back. O2 saturation is stable in the office today. He does have mild swelling, I will increase his Lasix to 3 days, however he does not appear to be significantly volume overloaded. Furthermore, I asked the patient regarding his previous angina symptom, his wife says he never had any chest pain and has always been shortness of breath before. However when talking with the patient, he says this episode of shortness of breath does not appears to be similar to what he experienced before. It is possible that his current symptoms represent pulmonary issues in etiology, however if it does not improve, we may have to consider a Myoview later. For now I will increase his Lasix to 40 mg twice a day and also potassium supplement  for 20 meq twice a day for 3 days before going back to the previous dose for both medications. BNP today  2. CAD s/p 5v CABG: According to his wife, his previous angina symptom was shortness of breath, he is having shortness of breath now, however patient says today's symptom is different from what he experienced before. He could not talk me the exact reason though. He is a very poor historian and most of the history has been supplied by his wife. He likely has some degree of dementia as he frequently look his wife for answer when I asked him a question.  3. CKD stage II/III: Plan to recheck basic metabolic panel today and also BMET in 1 week.  4. Hypertension: Blood pressure stable 118/84 today.  5. Hyperlipidemia: On Zocor 20 mg daily  6. DM 2: Diet controlled. We'll defer to primary care provider.  7. Carotid artery disease: Last carotid Doppler obtained in October 2015 showed moderate disease on the right, mild disease on the left. Consider reorder a carotid ultrasound this year.    Medication Adjustments/Labs and Tests Ordered: Current medicines are reviewed at length with the patient today.  Concerns regarding medicines are outlined above.  Medication changes, Labs and Tests ordered today are listed in the Patient Instructions below. Patient Instructions  Medication Instructions: Increase Lasix to 40 mg twice daily and Potassium to 20 mEq twice daily for 3 days; then return to regular dose--Lasix 40 mg daily and Potassium 20 mEq daily.   Labwork: Your physician recommends that you return for lab work today: BMET, BNP---repeat BMET in 1 week  in [place].   Follow-Up: Your physician recommends that you schedule a follow-up appointment on April 25th with Azalee Course, PA-C.  If you need a refill on your cardiac medications before your next appointment, please call your pharmacy.     Ramond Dial, [place]  01/13/2017 7:13 AM    [facility] [address] [place],  [place], [place]  [address] Phone: [phone]; Fax: [phone]

## 2017-01-12 NOTE — Patient Instructions (Signed)
Medication Instructions: Increase Lasix to 40 mg twice daily and Potassium to 20 mEq twice daily for 3 days; then return to regular dose--Lasix 40 mg daily and Potassium 20 mEq daily.   Labwork: Your physician recommends that you return for lab work today: BMET, BNP---repeat BMET in 1 week in [place].   Follow-Up: Your physician recommends that you schedule a follow-up appointment on April 25th with Azalee Course, PA-C.  If you need a refill on your cardiac medications before your next appointment, please call your pharmacy.

## 2017-01-13 ENCOUNTER — Encounter: Payer: Self-pay | Admitting: Physician Assistant

## 2017-01-24 ENCOUNTER — Other Ambulatory Visit: Payer: Self-pay | Admitting: Cardiovascular Disease

## 2017-01-24 NOTE — Telephone Encounter (Signed)
New message   Pt wife verbalized that she is calling to see if pt needs labs before his appt

## 2017-01-26 ENCOUNTER — Ambulatory Visit (INDEPENDENT_AMBULATORY_CARE_PROVIDER_SITE_OTHER): Payer: PPO | Admitting: Physician Assistant

## 2017-01-26 ENCOUNTER — Encounter: Payer: Self-pay | Admitting: *Deleted

## 2017-01-26 VITALS — BP 141/91 | HR 79 | Ht 67.0 in | Wt 225.4 lb

## 2017-01-26 DIAGNOSIS — I779 Disorder of arteries and arterioles, unspecified: Secondary | ICD-10-CM | POA: Diagnosis not present

## 2017-01-26 DIAGNOSIS — I10 Essential (primary) hypertension: Secondary | ICD-10-CM | POA: Diagnosis not present

## 2017-01-26 DIAGNOSIS — E785 Hyperlipidemia, unspecified: Secondary | ICD-10-CM | POA: Diagnosis not present

## 2017-01-26 DIAGNOSIS — Z9989 Dependence on other enabling machines and devices: Secondary | ICD-10-CM | POA: Diagnosis not present

## 2017-01-26 DIAGNOSIS — E119 Type 2 diabetes mellitus without complications: Secondary | ICD-10-CM

## 2017-01-26 DIAGNOSIS — I25709 Atherosclerosis of coronary artery bypass graft(s), unspecified, with unspecified angina pectoris: Secondary | ICD-10-CM

## 2017-01-26 DIAGNOSIS — N183 Chronic kidney disease, stage 3 unspecified: Secondary | ICD-10-CM

## 2017-01-26 DIAGNOSIS — I739 Peripheral vascular disease, unspecified: Secondary | ICD-10-CM

## 2017-01-26 DIAGNOSIS — Z79899 Other long term (current) drug therapy: Secondary | ICD-10-CM

## 2017-01-26 DIAGNOSIS — G4733 Obstructive sleep apnea (adult) (pediatric): Secondary | ICD-10-CM

## 2017-01-26 DIAGNOSIS — R0602 Shortness of breath: Secondary | ICD-10-CM | POA: Diagnosis not present

## 2017-01-26 LAB — BASIC METABOLIC PANEL
BUN: 20 mg/dL (ref 7–25)
CALCIUM: 9.8 mg/dL (ref 8.6–10.3)
CO2: 26 mmol/L (ref 20–31)
Chloride: 103 mmol/L (ref 98–110)
Creat: 1.47 mg/dL — ABNORMAL HIGH (ref 0.70–1.18)
Glucose, Bld: 107 mg/dL — ABNORMAL HIGH (ref 65–99)
POTASSIUM: 4.2 mmol/L (ref 3.5–5.3)
SODIUM: 142 mmol/L (ref 135–146)

## 2017-01-26 NOTE — Patient Instructions (Signed)
Medication Instructions:   No changes   Labwork:   BMET today  Testing/Procedures:  None ordered. If your symptoms of dyspnea (shortness of breath) get worse please let us know - we will want to have you do a stress test.  Follow-Up:  With Dr. Tresa Endo in 3 months (unless problems sooner)   Please also increase your ambulation (walking) and see if this helps your symptoms improve.   If you need a refill on your cardiac medications before your next appointment, please call your pharmacy.

## 2017-01-26 NOTE — Progress Notes (Signed)
Cardiology Office Note    Date:  01/27/2017   ID:  Daniel Mays, DOB 08/29/1941, MRN [id number]  PCP:  Jerl Mina, MD  Cardiologist:  Dr. Tresa Endo  Chief Complaint  Patient presents with  . Follow-up    seen for Dr. Tresa Endo. SOB    History of Present Illness:  Daniel Mays is a 76 y.o. male with PMH of HTN, HLD, DM II, OSA on CPAP, remote CVA, carotid artery disease, and CAD s/p CABG x5. He suffered an inferior STEMI in 2017 that was complicated by recurrent episode of ventricular fibrillation requiring numerous defibrillation, CPR, intra-aortic balloon pump insertion and pacemaker therapy. He had successful PTCA of totally occluded RCA. Due to severe concomitant CAD, bypass surgery was recommended. He eventually underwent CABG 5 with LIMA to LAD, SVG to diagonal, SVG to intermediate, sequential SVG to PDA and the PLA branch of the RCA. In June I, he underwent PCI of ost LAD and proximal intermediate vessel. He had up Myoview study in April 2014 that was low risk without significant scar or ischemia, only mild inferolateral thinning. EF 61%. Last echocardiogram obtained on 02/27/2014 showed EF 45-50% without wall motion abnormality. He also had a history of mild renal artery stenosis seen on duplex. He was admitted in October 2015 with strokelike symptoms. CT of the head was negative. MRI of the brain also was negative for acute stroke but showed remote right MCA territory infarct. Echocardiogram showed normal EF 60-65%. Carotid Doppler showed 40-59% internal carotid artery disease of the right and 1-39% on the left.  Remotely, he has tried amlodipine, however this gave him significant peripheral edema leading to its discontinuation. Last saw the patient on 01/12/2017, his main issue was shortness of breath at the time. He was evaluated by Dr. Meredeth Ide in [facility] with pulmonology service, he was treated with a course of antibiotic and steroid therapy. He says he responded to steroid  therapy quite well, and then noticed significant amount of improvement in his breathing. However as soon as he came off of the steroid therapy, his shortness of breath returned. His O2 saturation on her last office visit was 94% rest. He says he mainly notices shortness of breath with exertion. On physical exam, he was only having trace amount of lower extremity edema. He also has intermittent crackling in the base of his lung. I was not entirely sure if this represents cardiac or pulmonary etiology. As a trial, I increased his Lasix to 40 mg twice a day for 3 days from 40 mg daily previously. He has returned back to 40 mg daily. He returned today for reassessment.  After increasing his diuretic for 3 days, he has not noticed any difference in his symptom. It is unclear to me at this point if his symptom could be present angina equivalent or could this be pulmonary issue. The fact that he responded to steroid therapy make me think this is more likely to be pulmonary in nature. The duration of the symptom has not increased. I recommended for him to continue observation this point and the let us know if the symptoms increase, if it is, that I would recommend echocardiogram and a stress test.   Past Medical History:  Diagnosis Date  . Arthritis    knees  . CAD (coronary artery disease)    2D ECHO, 03/17/2010 - EF 45-50%, normal  . Chronic kidney disease    blockage in renal artery  . Diabetes mellitus without complication (HCC)  diet controlled  . GERD (gastroesophageal reflux disease)   . Hard of hearing   . Heart attack (HCC) 2007  . Hyperlipidemia   . Hypertension   . OSA (obstructive sleep apnea)    CPAP  . Wears dentures    full upper and lower    Past Surgical History:  Procedure Laterality Date  . CARDIAC CATHETERIZATION  04/01/2008   LAD ostium stented with a 2.5x80mm Promus DES stent reducing a 95% stenosis to 0%; Proximal intermediate Ramus stented with a 2.75x25mm Promus  drug-eluting stent reducing a 85% stenosis to 0% residual  . CARDIAC CATHETERIZATION  03/26/2008   Increased medical management  . CARDIAC CATHETERIZATION  04/27/2006   CABG recommended  . CATARACT EXTRACTION W/PHACO Left 04/16/2015   Procedure: CATARACT EXTRACTION PHACO AND INTRAOCULAR LENS PLACEMENT (IOC);  Surgeon: Lockie Mola, MD;  Location: [facility];  Service: Ophthalmology;  Laterality: Left;  CPAP DIABETIC  . CORONARY ANGIOPLASTY WITH STENT PLACEMENT  2010  . CORONARY ARTERY BYPASS GRAFT  04/29/2006   LIMA to LAD, SVG to first diagonal, SVG to ramus intermedius, SVG to PDA, and SVG to PLA branch of RCA  . EXERCISE STRESS TEST  01/18/2013   Small fixed inferolateral defect-artifact is favored. No ischemia  . EYE SURGERY  1987  . TEE WITHOUT CARDIOVERSION  04/29/2006    Current Medications: Outpatient Medications Prior to Visit  Medication Sig Dispense Refill  . acetaminophen (TYLENOL) 500 MG tablet Take 500 mg by mouth 3 (three) times daily.    Marland Kitchen albuterol (PROAIR HFA) 108 (90 Base) MCG/ACT inhaler Inhale 90 mcg into the lungs.    Marland Kitchen aspirin EC 81 MG tablet Take 81 mg by mouth daily. AM    . clopidogrel (PLAVIX) 75 MG tablet Take 1 tablet (75 mg total) by mouth daily. 30 tablet 11  . isosorbide mononitrate (IMDUR) 60 MG 24 hr tablet TAKE 1 TABLET BY MOUTH ONCE DAILY OVERDUE  FOR  APPOINMENT  AND  FOR  FURTHER  REFILLS 30 tablet 0  . metoprolol (LOPRESSOR) 50 MG tablet TAKE 1 TABLET BY MOUTH TWICE DAILY OVERDUE  FOR  APPOINTMENT  AND  FOR  FURTHER  REFILLS 30 tablet 0  . Omega-3 Fatty Acids (FISH OIL PO) Take by mouth.    . pantoprazole (PROTONIX) 40 MG tablet Take 1 tablet (40 mg total) by mouth daily. 90 tablet 3  . simvastatin (ZOCOR) 20 MG tablet TAKE ONE TABLET BY MOUTH AT BEDTIME 90 tablet 3  . furosemide (LASIX) 40 MG tablet Take 40 mg (1 tab) by mouth twice daily for 3 days; then return to 40 mg (1 tab) daily. (Patient taking differently: Take 40 mg by mouth  daily. ) 33 tablet 0  . potassium chloride SA (K-DUR,KLOR-CON) 20 MEQ tablet Take 20 mEq (1 tab) by mouth twice daily for 3 days; then return to 20 mEq (1 tab) daily. 33 tablet 0   No facility-administered medications prior to visit.      Allergies:   Patient has no known allergies.   Social History   Social History  . Marital status: Married    Spouse name: N/A  . Number of children: N/A  . Years of education: N/A   Occupational History  . Retired Scientist, research (medical)    Social History Main Topics  . Smoking status: Former Smoker    Packs/day: 5.00    Years: 55.00    Types: Cigarettes    Start date: 10/04/1946    Quit date:  10/04/2001  . Smokeless tobacco: Never Used  . Alcohol use 1.8 oz/week    3 Shots of liquor per week     Comment: occ  . Drug use: No  . Sexual activity: Not Asked   Other Topics Concern  . None   Social History Narrative  . None     Family History:  The patient's family history includes Diabetes in his mother; Parkinson's disease in his mother.   ROS:   Please see the history of present illness.    ROS All other systems reviewed and are negative.   PHYSICAL EXAM:   VS:  BP (!) 141/91   Pulse 79   Ht 5\' 7"  (1.702 m)   Wt 225 lb 6.4 oz (102.2 kg)   BMI 35.30 kg/m    GEN: Well nourished, well developed, in no acute distress  HEENT: normal  Neck: no JVD, carotid bruits, or masses Cardiac: RRR; no murmurs, rubs, or gallops,no edema  Respiratory:  clear to auscultation bilaterally, normal work of breathing GI: soft, nontender, nondistended, + BS MS: no deformity or atrophy  Skin: warm and dry, no rash Neuro:  Alert and Oriented x 3, Strength and sensation are intact Psych: euthymic mood, full affect  Wt Readings from Last 3 Encounters:  01/26/17 225 lb 6.4 oz (102.2 kg)  01/12/17 224 lb (101.6 kg)  12/08/15 229 lb (103.9 kg)      Studies/Labs Reviewed:   EKG:  EKG is not ordered today.    Recent Labs: 01/26/2017: BUN 20; Creat  1.47; Potassium 4.2; Sodium 142   Lipid Panel    Component Value Date/Time   CHOL 120 07/29/2014 0047   CHOL 146 07/12/2013 0935   TRIG 184 (H) 07/29/2014 0047   HDL 29 (L) 07/29/2014 0047   HDL 38 (L) 07/12/2013 0935   CHOLHDL 4.1 07/29/2014 0047   VLDL 37 07/29/2014 0047   LDLCALC 54 07/29/2014 0047   LDLCALC 66 07/12/2013 0935    Additional studies/ records that were reviewed today include:   Myoview 01/18/2013 Overall Impression:Low risk stress nuclear study.Small fixed inferolateral defect -artifact is favored. No ischemia.  LV Wall Motion: NL LV Function; NL Wall Motion; EF 61%.   Echo 07/30/2014 LV EF: 60% -  65%  Study Conclusions  - Procedure narrative: Transthoracic echocardiography. Image quality was adequate. The study was technically difficult, as a result of poor sound wave transmission. - Left ventricle: The cavity size was normal. Wall thickness was increased in a pattern of mild LVH. Systolic function was normal. The estimated ejection fraction was in the range of 60% to 65%. Wall motion was normal; there were no regional wall motion abnormalities. Doppler parameters are consistent with abnormal left ventricular relaxation (grade 1 diastolic dysfunction). The E/e&' ratio is <8, suggesting normal LV filling pressure. - Aorta: Aortic root dimension: 40 mm (ED). - Aortic root: The aortic root is mildly dilated. - Tricuspid valve: There was trivial regurgitation. - Pulmonary arteries: PA peak pressure: 9 mm Hg (S).  Impressions:  - Compared to the prior echo in 02/2014, LV function has now normalized.   Renal artery [place] 07/30/2015 Normal caliber abdominal aorta. Normal and symmetrical kidney size. 1-59% right renal artery stenosis > 60% left renal artery stenosis >70% celiac stenosis > 70% SMA stenosis   ASSESSMENT:    1. SOB (shortness of breath)   2. Encounter for long-term (current) use of medications   3. Stage 3  chronic kidney disease   4. Essential hypertension  5. Hyperlipidemia, unspecified hyperlipidemia type   6. Controlled type 2 diabetes mellitus without complication, without long-term current use of insulin (HCC)   7. Coronary artery disease involving coronary bypass graft of native heart with angina pectoris (HCC)   8. Bilateral carotid artery disease (HCC)   9. OSA on CPAP      PLAN:  In order of problems listed above:  1. Dyspnea: Mainly noticed on exertion, responded well to steroid therapy. However did not respond much to increased dose of Lasix. He is now back on the previous dose of Lasix. He denies any chest pain, however he never had a chest pain even prior to his bypass surgery either. He says the episode of shortness of breath does not appears to be similar to what he experienced before. It is possible that her symptom is more related to pulmonary etiology. I recommended observation for now, if it worsens, then obtain echocardiogram and stress test.  2. CAD s/p 5v CABG: Denies any recent chest pain, his wife mentions his previous angina symptom was shortness of breath, however patient says his symptom is different from what  he experienced before.  3. CKD stage II/III: Obtain basic metabolic panel.  4. HTN: Blood pressure well controlled  5. HLD: On Zocor 20 mg daily  6. DM II: Diet-controlled  7. Carotid artery disease: Moderate disease on the right and a mild disease on the left on the last carotid Doppler in October 2015. Likely will need a repeat Dopplers this year.    Medication Adjustments/Labs and Tests Ordered: Current medicines are reviewed at length with the patient today.  Concerns regarding medicines are outlined above.  Medication changes, Labs and Tests ordered today are listed in the Patient Instructions below. Patient Instructions  Medication Instructions:   No changes   Labwork:   BMET today  Testing/Procedures:  None ordered. If your symptoms  of dyspnea (shortness of breath) get worse please let us know - we will want to have you do a stress test.  Follow-Up:  With Dr. Tresa Endo in 3 months (unless problems sooner)   Please also increase your ambulation (walking) and see if this helps your symptoms improve.   If you need a refill on your cardiac medications before your next appointment, please call your pharmacy.      Ramond Dial, [place]  01/27/2017 2:45 PM    [facility] [address] [place], [place], [place]  [address] Phone: [phone]; Fax: [phone]

## 2017-01-27 ENCOUNTER — Encounter: Payer: Self-pay | Admitting: Physician Assistant

## 2017-02-02 ENCOUNTER — Other Ambulatory Visit: Payer: Self-pay | Admitting: Cardiovascular Disease

## 2017-02-02 NOTE — Telephone Encounter (Signed)
REFILL 

## 2017-02-03 DIAGNOSIS — J441 Chronic obstructive pulmonary disease with (acute) exacerbation: Secondary | ICD-10-CM | POA: Diagnosis not present

## 2017-02-03 DIAGNOSIS — M542 Cervicalgia: Secondary | ICD-10-CM | POA: Diagnosis not present

## 2017-02-03 DIAGNOSIS — H9193 Unspecified hearing loss, bilateral: Secondary | ICD-10-CM | POA: Diagnosis not present

## 2017-02-15 ENCOUNTER — Other Ambulatory Visit: Payer: Self-pay | Admitting: Physician Assistant

## 2017-02-16 NOTE — Telephone Encounter (Signed)
Please review for refill. Thanks!  

## 2017-02-16 NOTE — Telephone Encounter (Signed)
Rx has been sent to the pharmacy electronically. ° °

## 2017-03-02 DIAGNOSIS — H903 Sensorineural hearing loss, bilateral: Secondary | ICD-10-CM | POA: Diagnosis not present

## 2017-03-02 DIAGNOSIS — R42 Dizziness and giddiness: Secondary | ICD-10-CM | POA: Diagnosis not present

## 2017-03-02 DIAGNOSIS — J45991 Cough variant asthma: Secondary | ICD-10-CM | POA: Diagnosis not present

## 2017-03-07 ENCOUNTER — Other Ambulatory Visit: Payer: Self-pay | Admitting: Physician Assistant

## 2017-03-07 NOTE — Telephone Encounter (Signed)
REFILL 

## 2017-03-07 NOTE — Telephone Encounter (Signed)
Please review for refill, Thanks !  

## 2017-03-10 ENCOUNTER — Encounter: Payer: Self-pay | Admitting: Internal Medicine

## 2017-03-10 ENCOUNTER — Ambulatory Visit (INDEPENDENT_AMBULATORY_CARE_PROVIDER_SITE_OTHER): Payer: PPO | Admitting: Internal Medicine

## 2017-03-10 ENCOUNTER — Other Ambulatory Visit: Payer: Self-pay | Admitting: *Deleted

## 2017-03-10 VITALS — BP 128/76 | HR 56 | Resp 16 | Ht 67.0 in | Wt 220.0 lb

## 2017-03-10 DIAGNOSIS — R12 Heartburn: Secondary | ICD-10-CM | POA: Insufficient documentation

## 2017-03-10 DIAGNOSIS — J449 Chronic obstructive pulmonary disease, unspecified: Secondary | ICD-10-CM | POA: Diagnosis not present

## 2017-03-10 DIAGNOSIS — I219 Acute myocardial infarction, unspecified: Secondary | ICD-10-CM | POA: Insufficient documentation

## 2017-03-10 DIAGNOSIS — I10 Essential (primary) hypertension: Secondary | ICD-10-CM | POA: Insufficient documentation

## 2017-03-10 DIAGNOSIS — K219 Gastro-esophageal reflux disease without esophagitis: Secondary | ICD-10-CM | POA: Insufficient documentation

## 2017-03-10 DIAGNOSIS — R351 Nocturia: Secondary | ICD-10-CM | POA: Insufficient documentation

## 2017-03-10 MED ORDER — PREDNISONE 20 MG PO TABS: 20.0000 mg | ORAL_TABLET | Freq: Every day | ORAL | 0 refills | Status: DC

## 2017-03-10 MED ORDER — ALBUTEROL SULFATE (2.5 MG/3ML) 0.083% IN NEBU: 2.5000 mg | INHALATION_SOLUTION | Freq: Four times a day (QID) | RESPIRATORY_TRACT | 12 refills | Status: DC | PRN

## 2017-03-10 MED ORDER — BUDESONIDE-FORMOTEROL FUMARATE 160-4.5 MCG/ACT IN AERO: 2.0000 | INHALATION_SPRAY | Freq: Two times a day (BID) | RESPIRATORY_TRACT | 0 refills | Status: AC
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
## 2017-03-10 NOTE — Patient Instructions (Signed)
Check ONO Check Start Symbicort  ALbuterol Nebs Lung cancer screening referral

## 2017-03-11 ENCOUNTER — Other Ambulatory Visit: Payer: Self-pay | Admitting: *Deleted

## 2017-03-11 DIAGNOSIS — G4733 Obstructive sleep apnea (adult) (pediatric): Secondary | ICD-10-CM | POA: Diagnosis not present

## 2017-03-11 DIAGNOSIS — J449 Chronic obstructive pulmonary disease, unspecified: Secondary | ICD-10-CM | POA: Diagnosis not present

## 2017-03-11 MED ORDER — ALBUTEROL SULFATE (2.5 MG/3ML) 0.083% IN NEBU: 2.5000 mg | INHALATION_SOLUTION | Freq: Four times a day (QID) | RESPIRATORY_TRACT | 12 refills | Status: AC | PRN

## 2017-03-15 ENCOUNTER — Encounter: Payer: Self-pay | Admitting: Internal Medicine

## 2017-03-15 DIAGNOSIS — J449 Chronic obstructive pulmonary disease, unspecified: Secondary | ICD-10-CM

## 2017-03-15 DIAGNOSIS — G4733 Obstructive sleep apnea (adult) (pediatric): Secondary | ICD-10-CM | POA: Diagnosis not present

## 2017-03-16 DIAGNOSIS — G4733 Obstructive sleep apnea (adult) (pediatric): Secondary | ICD-10-CM | POA: Diagnosis not present

## 2017-03-16 DIAGNOSIS — J449 Chronic obstructive pulmonary disease, unspecified: Secondary | ICD-10-CM | POA: Diagnosis not present

## 2017-03-18 ENCOUNTER — Ambulatory Visit (INDEPENDENT_AMBULATORY_CARE_PROVIDER_SITE_OTHER): Payer: PPO | Admitting: *Deleted

## 2017-03-18 DIAGNOSIS — J449 Chronic obstructive pulmonary disease, unspecified: Secondary | ICD-10-CM | POA: Diagnosis not present

## 2017-03-18 NOTE — Progress Notes (Signed)
SIX MIN WALK 03/18/2017 08/16/2012  Medications Albuterol neb0.083%;ASA 81 mg; Symbicort 160 2 puffs; Plavix 75 mg; Aricept 5 mg; Furosemid 40 mg; Isosorbide Monoitrate 60 mg; Metoprolol Tartrate 50 mg -  Supplimental Oxygen during Test? (L/min) No No  Laps 5 -  Partial Lap (in Meters) 0 -  Baseline BP (sitting) 124/78 -  Baseline Heartrate 87 -  Baseline Dyspnea (Borg Scale) 0 -  Baseline Fatigue (Borg Scale) 4 -  Baseline SPO2 93 -  BP (sitting) 126/80 -  Heartrate 93 -  Dyspnea (Borg Scale) 3 -  Fatigue (Borg Scale) 7 -  SPO2 91 -  BP (sitting) 130/80 -  Heartrate 83 -  SPO2 92 -  Stopped or Paused before Six Minutes No -  Distance Completed 240 -  Tech Comments: Pt walked at normal walking pace. Patient did not desat. Pt only complained of fatigue. -     SMW performed

## 2017-03-21 ENCOUNTER — Other Ambulatory Visit: Payer: Self-pay | Admitting: Internal Medicine

## 2017-03-21 DIAGNOSIS — J449 Chronic obstructive pulmonary disease, unspecified: Secondary | ICD-10-CM

## 2017-03-21 NOTE — Progress Notes (Signed)
Patient's spouse aware of results. Orders placed.

## 2017-03-23 DIAGNOSIS — J449 Chronic obstructive pulmonary disease, unspecified: Secondary | ICD-10-CM | POA: Diagnosis not present

## 2017-03-23 DIAGNOSIS — G4733 Obstructive sleep apnea (adult) (pediatric): Secondary | ICD-10-CM | POA: Diagnosis not present

## 2017-04-05 ENCOUNTER — Other Ambulatory Visit: Payer: Self-pay | Admitting: Cardiovascular Disease

## 2017-04-05 NOTE — Telephone Encounter (Signed)
REFILL 

## 2017-04-22 DIAGNOSIS — G4733 Obstructive sleep apnea (adult) (pediatric): Secondary | ICD-10-CM | POA: Diagnosis not present

## 2017-04-22 DIAGNOSIS — J449 Chronic obstructive pulmonary disease, unspecified: Secondary | ICD-10-CM | POA: Diagnosis not present

## 2017-04-26 ENCOUNTER — Encounter: Payer: Self-pay | Admitting: Internal Medicine

## 2017-04-26 ENCOUNTER — Ambulatory Visit (INDEPENDENT_AMBULATORY_CARE_PROVIDER_SITE_OTHER): Payer: PPO | Admitting: Internal Medicine

## 2017-04-26 VITALS — BP 124/90 | HR 60 | Ht 67.0 in | Wt 225.0 lb

## 2017-04-26 DIAGNOSIS — J9611 Chronic respiratory failure with hypoxia: Secondary | ICD-10-CM | POA: Diagnosis not present

## 2017-04-26 NOTE — Patient Instructions (Signed)
Continue inhalers as prescribed 
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
## 2017-05-03 ENCOUNTER — Encounter: Payer: Self-pay | Admitting: Cardiovascular Disease

## 2017-05-03 ENCOUNTER — Ambulatory Visit (INDEPENDENT_AMBULATORY_CARE_PROVIDER_SITE_OTHER): Payer: PPO | Admitting: Cardiovascular Disease

## 2017-05-03 VITALS — BP 112/74 | HR 52 | Ht 67.0 in | Wt 225.8 lb

## 2017-05-03 DIAGNOSIS — I10 Essential (primary) hypertension: Secondary | ICD-10-CM

## 2017-05-03 DIAGNOSIS — I2510 Atherosclerotic heart disease of native coronary artery without angina pectoris: Secondary | ICD-10-CM | POA: Diagnosis not present

## 2017-05-03 DIAGNOSIS — G4733 Obstructive sleep apnea (adult) (pediatric): Secondary | ICD-10-CM

## 2017-05-03 DIAGNOSIS — Z9989 Dependence on other enabling machines and devices: Secondary | ICD-10-CM

## 2017-05-03 DIAGNOSIS — E785 Hyperlipidemia, unspecified: Secondary | ICD-10-CM

## 2017-05-03 DIAGNOSIS — Z79899 Other long term (current) drug therapy: Secondary | ICD-10-CM | POA: Diagnosis not present

## 2017-05-03 DIAGNOSIS — Z951 Presence of aortocoronary bypass graft: Secondary | ICD-10-CM

## 2017-05-03 NOTE — Patient Instructions (Signed)
Medication Instructions:  Continue current medications  Labwork: CBC, BMP, TSH, Fasting Lipids  Testing/Procedures: None Ordered  Follow-Up: Your physician wants you to follow-up in: 6 Months You will receive a reminder letter in the mail two months in advance. If you don't receive a letter, please call our office to schedule the follow-up appointment.   Any Other Special Instructions Will Be Listed Below (If Applicable).   If you need a refill on your cardiac medications before your next appointment, please call your pharmacy.

## 2017-05-03 NOTE — Progress Notes (Signed)
Patient ID: Daniel Mays, male   DOB: 1941-08-09, 76 y.o.   MRN: [id number]     HPI: Daniel Mays is a 76 y.o. male who presents to the office today for a 16 month follow up evaluation.   Mr. Tagle has known CAD and in June 2007 suffered an inferior wall ST segment elevation myocardial infarction which was complicated by recurrent episodes of ventricular fibrillation requiring numerous to defibrillations, CPR, intra-aortic balloon pump insertion and pacemaker therapy. At that time, I performed successful PTCA of a otally occluded right coronary artery with restoration of TIMI-3 flow. Due to severe concomitant CAD I recommended elective CABG surgery which was done the following day by Dr. Roxan Hockey. He underwent CABG x5 with a LIMA to the LAD, a vein to the diagonal, vein to the intermediate, sequential vein to the PDA and PLA branch of the right coronary artery. In June 2009 he underwent 2 vessel intervention involving the LAD ostium and proximal intermediate vessel. In April 2014  a myocardial perfusion study was low risk without significant scar or ischemia in only mild inferolateral thinning. Ejection fraction 61%. He had normal wall motion. An echo Doppler study on 02/27/2014, which showed an ejection fraction of 45-50% without wall motion abnormality.  Estimated pulmonary pressure was normal.  He has a history of hyperlipidemia, obesity, and mild renal artery stenosis on duplex imaging.  He has a history of complex sleep apnea, and had not been on CPAP therapy for several years. He  was referred for a follow-up sleep study on 07/20/2013. This was done in a split-night protocol due to severe sleep apnea. His overall AHI was 65.7 per hour. He was unable to reach REM sleep in the baseline portion of the study. Oxygen dropped to 86% during non-REM sleep and has evidence for loud snoring. He was titrated to 13 cm water pressure but due to development of significant central events, BiPAP was  started at 14/10 was increased to 15/11 cm. He received an AirSense 10 ResMed unit on 10/26/2013.  When I last saw him, he was having difficulty in significant changes were made teamed.  I reduced his EPAP min to  8 but he had the potential to increase to 18/14 if necessary.  We also changed him to a heated coil hose for improved humidification.  Subsequently, he has been sleeping significantly better.  He denies residual daytime sleepiness.  He is unaware of breakthrough snoring.  Presently he only minutes to infrequent use.  He was admitted to [facility] 07/28/2014 and was discharged 2 days later.  He presented with dysarthria, left face weakness, and confusion.  A CT of his head was negative for acute stroke.  Neurology was consulted.  An MRI of his brain was also negative for acute stroke but showed remote right MCA territory infarct.  A subsequent echo showed an EF of 60-65%.  Carotid Dopplers revealed a 40-59% internal carotid stenosis on the right and left 1 a 39% with antegrade flow.  Lipid studies showed an LDL cholesterol of 54 total cholesterol 120, triglycerides were elevated at 184 and HDL was low at 29.    Since I last saw him in March 2016, he denies any significant episodes of chest pressure.  He was seen in the office in April 2018 by [name] with shortness of breath.  The patient had also seen Dr. Raul Del in [place] and was treated with a course of antibiotic and steroid therapy.  He responded to steroid therapy.  When he saw [facility] follow-up.  He was given a short course of increased diuretic regimen for several days but was uncertain if his shortness of breath was pulmonary or cardiac etiology.  Subsequent, he has felt improved.  He denies any recent wheezing.  He denies fevers, chills or night sweats.  He denies significant leg swelling.  He admits to 100% compliance with his CPAP therapy for his obstructive sleep apnea.  He tells me he was started on supplemental oxygen at night,  which is black into his CPAP machine.  He denies any anginal symptoms.  He presents for evaluation.  Past Medical History:  Diagnosis Date  . Arthritis    knees  . CAD (coronary artery disease)    2D ECHO, 03/17/2010 - EF 45-50%, normal  . Chronic kidney disease    blockage in renal artery  . Diabetes mellitus without complication (HCC)    diet controlled  . GERD (gastroesophageal reflux disease)   . Hard of hearing   . Heart attack ([place]) 2007  . Hyperlipidemia   . Hypertension   . OSA (obstructive sleep apnea)    CPAP  . Wears dentures    full upper and lower    Past Surgical History:  Procedure Laterality Date  . CARDIAC CATHETERIZATION  04/01/2008   LAD ostium stented with a 2.5x14m Promus DES stent reducing a 95% stenosis to 0%; Proximal intermediate Ramus stented with a 2.75x152mPromus drug-eluting stent reducing a 85% stenosis to 0% residual  . CARDIAC CATHETERIZATION  03/26/2008   Increased medical management  . CARDIAC CATHETERIZATION  04/27/2006   CABG recommended  . CATARACT EXTRACTION W/PHACO Left 04/16/2015   Procedure: CATARACT EXTRACTION PHACO AND INTRAOCULAR LENS PLACEMENT (IOC);  Surgeon: ChLeandrew KoyanagiMD;  [place] Service: Ophthalmology;  Laterality: Left;  CPAP DIABETIC  . CORONARY ANGIOPLASTY WITH STENT PLACEMENT  2010  . CORONARY ARTERY BYPASS GRAFT  04/29/2006   LIMA to LAD, SVG to first diagonal, SVG to ramus intermedius, SVG to PDA, and SVG to PLA branch of RCA  . EXERCISE STRESS TEST  01/18/2013   Small fixed inferolateral defect-artifact is favored. No ischemia  . EYE SURGERY  1987  . TEE WITHOUT CARDIOVERSION  04/29/2006    No Known Allergies  Current Outpatient Prescriptions  Medication Sig Dispense Refill  . acetaminophen (TYLENOL) 500 MG tablet Take 500 mg by mouth 3 (three) times daily.    . Marland Kitchenlbuterol (PROAIR HFA) 108 (90 Base) MCG/ACT inhaler Inhale 90 mcg into the lungs.    . Marland Kitchenlbuterol (PROVENTIL) (2.5 MG/3ML)  0.083% nebulizer solution Take 3 mLs (2.5 mg total) by nebulization every 6 (six) hours as needed for wheezing or shortness of breath. DX: COPD DX Code: J44.9 75 mL 12  . aspirin EC 81 MG tablet Take 81 mg by mouth daily. AM    . budesonide-formoterol (SYMBICORT) 160-4.5 MCG/ACT inhaler Inhale 2 puffs into the lungs 2 (two) times daily. 1 Inhaler 0  . clopidogrel (PLAVIX) 75 MG tablet TAKE ONE TABLET BY MOUTH ONCE DAILY 90 tablet 1  . donepezil (ARICEPT) 5 MG tablet Take 5 mg by mouth daily.    . furosemide (LASIX) 40 MG tablet Take 40 mg by mouth daily.    . isosorbide mononitrate (IMDUR) 60 MG 24 hr tablet Take 1 tablet (60 mg total) by mouth daily. 90 tablet 1  . metoprolol tartrate (LOPRESSOR) 50 MG tablet Take 1 tablet (50 mg total) by mouth 2 (two) times daily. 180 tablet  2  . pantoprazole (PROTONIX) 40 MG tablet Take 1 tablet (40 mg total) by mouth daily. 90 tablet 3  . potassium chloride SA (K-DUR,KLOR-CON) 20 MEQ tablet Take 20 mEq by mouth daily.    . simvastatin (ZOCOR) 20 MG tablet TAKE ONE TABLET BY MOUTH AT BEDTIME 90 tablet 3   No current facility-administered medications for this visit.     Social History   Social History  . Marital status: Married    Spouse name: N/A  . Number of children: N/A  . Years of education: N/A   Occupational History  . Retired Nurse, learning disability    Social History Main Topics  . Smoking status: Former Smoker    Packs/day: 5.00    Years: 55.00    Types: Cigarettes    Start date: 10/04/1946    Quit date: 10/04/2001  . Smokeless tobacco: Never Used  . Alcohol use 1.8 oz/week    3 Shots of liquor per week     Comment: occ  . Drug use: No  . Sexual activity: Not on file   Other Topics Concern  . Not on file   Social History Narrative  . No narrative on file    Family History  Problem Relation Age of Onset  . Parkinson's disease Mother   . Diabetes Mother     ROS General: Negative; No fevers, chills, or night sweats HEENT:  Negative; No changes in vision or hearing, sinus congestion, difficulty swallowing Pulmonary: Previous shortness of breath which improved with antibiotics and steroid taper Cardiovascular: See HPI: No chest pain, presyncope, syncope, palpatations Positive for leg edema, which has improved, but still is present GI: Positive for GERD No nausea, vomiting, diarrhea, or abdominal pain GU: Negative; No dysuria, hematuria, or difficulty voiding Musculoskeletal: Negative; no myalgias, joint pain, or weakness Hematologic: Negative; no easy bruising, bleeding Endocrine: Negative; no heat/cold intolerance; no diabetes, Neuro: see history of present illness Skin: Negative; No rashes or skin lesions Psychiatric: Negative; No behavioral problems, depression Sleep: Positive for complex sleep apnea with current BiPAP settings no snoring,  daytime sleepiness, hypersomnolence, bruxism, restless legs, hypnogognic hallucinations. Other comprehensive 14 point system review is negative   Physical Exam BP 112/74   Pulse (!) 52   Ht 5' 7" (1.702 m)   Wt 225 lb 12.8 oz (102.4 kg)   BMI 35.37 kg/m    Wt Readings from Last 3 Encounters:  05/03/17 225 lb 12.8 oz (102.4 kg)  04/26/17 225 lb (102.1 kg)  03/10/17 220 lb (99.8 kg)   General: Alert, oriented, no distress.  Skin: normal turgor, no rashes, warm and dry HEENT: Normocephalic, atraumatic. Pupils equal round and reactive to light; sclera anicteric; extraocular muscles intact;  Nose without nasal septal hypertrophy Mouth/Parynx benign; Mallinpatti scale 3/4 Neck: No JVD, no carotid bruits; normal carotid upstroke Lungs: clear to ausculatation and percussion; no wheezing or rales Chest wall: without tenderness to palpitation Heart: PMI not displaced, RRR, s1 s2 normal, 1/6 systolic murmur, no diastolic murmur, no rubs, gallops, thrills, or heaves Abdomen: Moderate diastases recti; soft, nontender; no hepatosplenomehaly, BS+; abdominal aorta nontender  and not dilated by palpation. Back: no CVA tenderness Pulses 2+ Musculoskeletal: full range of motion, normal strength, no joint deformities Extremities: Resolution of prior edema; no clubbing cyanosis, Homan's sign negative  Neurologic: grossly nonfocal; Cranial nerves grossly wnl Psychologic: Normal mood and affect  ECG (independently read by me): Sinus bradycardia at 52 bpm.  First-degree AV block with a PR interval of 224 ms.  QTc interval normal.  No significant ST-T changes  March 2017 ECG (independently read by me):  Sinus rhythm with an occasional PVC. Nonspecific ST changes. Borderline first-degree AV block  Novembr 2015 ECG (independently read by me): Normal sinus rhythm with first degree AV block with a PR interval at 210 ms.  No significant ST segment changes.  June 2015 ECG (independently read by me): Normal sinus rhythm at 68 beats per minute.  Mild first degree AV block with PR interval 208 ms.  Nonspecific ST-T change  LABS:  BMP Latest Ref Rng & Units 01/26/2017 01/12/2017 07/29/2014  Glucose 65 - 99 mg/dL 107(H) 111(H) 165(H)  BUN 7 - 25 mg/dL _0 Creatinine 0.70 - 1.18 mg/dL 1.47(H) 1.46(H) 1.24  BUN/Creat Ratio 10 - 22 - - -  Sodium 135 - 146 mmol/L 142 144 140  Potassium 3.5 - 5.3 mmol/L 4.2 4.7 3.5(L)  Chloride 98 - 110 mmol/L 103 106 103  CO2 20 - 31 mmol/L _1 Calcium 8.6 - 10.3 mg/dL 9.8 8.8 8.9   Hepatic Function Latest Ref Rng & Units 07/28/2014 04/17/2014 07/12/2013  Total Protein 6.0 - 8.3 g/dL 7.7 7.0 6.8  Albumin 3.5 - 5.2 g/dL 4.1 4.5 4.5  AST 0 - 37 U/L _2 ALT 0 - 53 U/L _3 Alk Phosphatase 39 - 117 U/L 44 47 45  Total Bilirubin 0.3 - 1.2 mg/dL 0.3 0.4 0.3   CBC Latest Ref Rng & Units 07/28/2014 04/17/2014 07/12/2013  WBC 4.0 - 10.5 K/uL 8.6 7.3 8.9  Hemoglobin 13.0 - 17.0 g/dL 13.7 14.5 13.9  Hematocrit 39.0 - 52.0 % 41.5 42.7 41.8  Platelets 150 - 400 K/uL 278 282 268   Lab Results  Component Value Date   MCV 89.1  07/28/2014   MCV 85.6 04/17/2014   MCV 87 07/12/2013    Lab Results  Component Value Date   TSH 1.357 04/17/2014   Lab Results  Component Value Date   HGBA1C 6.4 (H) 07/28/2014   Lipid Panel     Component Value Date/Time   CHOL 120 07/29/2014 0047   CHOL 146 07/12/2013 0935   TRIG 184 (H) 07/29/2014 0047   HDL 29 (L) 07/29/2014 0047   HDL 38 (L) 07/12/2013 0935   CHOLHDL 4.1 07/29/2014 0047   VLDL 37 07/29/2014 0047   LDLCALC 54 07/29/2014 0047   LDLCALC 66 07/12/2013 0935   RADIOLOGY: No results found.  IMPRESSION: 1. Essential hypertension   2. Coronary artery disease involving native coronary artery of native heart without angina pectoris   3. Hx of CABG   4. Hyperlipidemia with target LDL less than 70   5. OSA on CPAP   6. Medication management     ASSESSMENT AND PLAN: Mr. Portnoy is a 76 year old gentleman who is status post remote MI with cardiac arrest, requiring numerous defibrillations for ventricular fibrillation, CPR, IABP insertion,and  pacemaker therapy in 2007.  He is status post CABG revascularization surgery following successful PTCA to his totally occluded RCA with resumption of TIMI-3 flow for myocardial salvage prior to his current CABG surgery. Remotely, he had developed significant peripheral edema on amlodipine leading to its discontinuance.  His blood pressure today is well controlled on his current regimen consisting of furosemide 40 mg daily, metoprolol 50 mg twice a day in addition to his isosorbide 60 mg.  He is not having any recurrent anginal symptomatology.  He continues to be on dual antiplatelet  therapy for his CAD and inferior STEMI.  There is no bleeding.  He continues to use CPAP with 100% compliance and now is on supplemental oxygen at night.  In April, his creatinine had risen to 1.47.  I do not have results of lipid studies.  Since 2015.  He has not had recent blood work by his primary physician.  I have recommended fasting laboratory be  obtained.  Adjustments to his medical regimen will be made if necessary depending upon laboratory results.  I will see him in 6 months for reevaluation. Time spent: 25 minutes  Troy Sine, MD, [facility]  05/03/2017 6:32 PM

## 2017-05-23 DIAGNOSIS — J449 Chronic obstructive pulmonary disease, unspecified: Secondary | ICD-10-CM | POA: Diagnosis not present

## 2017-05-23 DIAGNOSIS — G4733 Obstructive sleep apnea (adult) (pediatric): Secondary | ICD-10-CM | POA: Diagnosis not present

## 2017-06-23 DIAGNOSIS — J449 Chronic obstructive pulmonary disease, unspecified: Secondary | ICD-10-CM | POA: Diagnosis not present

## 2017-06-23 DIAGNOSIS — G4733 Obstructive sleep apnea (adult) (pediatric): Secondary | ICD-10-CM | POA: Diagnosis not present

## 2017-07-23 DIAGNOSIS — J449 Chronic obstructive pulmonary disease, unspecified: Secondary | ICD-10-CM | POA: Diagnosis not present

## 2017-07-23 DIAGNOSIS — G4733 Obstructive sleep apnea (adult) (pediatric): Secondary | ICD-10-CM | POA: Diagnosis not present

## 2017-08-12 ENCOUNTER — Other Ambulatory Visit: Payer: Self-pay | Admitting: Cardiovascular Disease

## 2017-08-12 ENCOUNTER — Other Ambulatory Visit: Payer: Self-pay | Admitting: Physician Assistant

## 2017-08-12 NOTE — Telephone Encounter (Signed)
Refill Request.  

## 2017-08-23 DIAGNOSIS — J449 Chronic obstructive pulmonary disease, unspecified: Secondary | ICD-10-CM | POA: Diagnosis not present

## 2017-08-23 DIAGNOSIS — G4733 Obstructive sleep apnea (adult) (pediatric): Secondary | ICD-10-CM | POA: Diagnosis not present

## 2017-08-24 ENCOUNTER — Other Ambulatory Visit: Payer: Self-pay | Admitting: Physician Assistant

## 2017-08-24 NOTE — Telephone Encounter (Signed)
Refill request

## 2017-08-24 NOTE — Telephone Encounter (Signed)
REFILL 

## 2017-08-29 ENCOUNTER — Telehealth: Payer: Self-pay | Admitting: Cardiovascular Disease

## 2017-08-29 DIAGNOSIS — I2510 Atherosclerotic heart disease of native coronary artery without angina pectoris: Secondary | ICD-10-CM

## 2017-08-29 DIAGNOSIS — I10 Essential (primary) hypertension: Secondary | ICD-10-CM

## 2017-08-29 DIAGNOSIS — E785 Hyperlipidemia, unspecified: Secondary | ICD-10-CM

## 2017-08-29 NOTE — Telephone Encounter (Signed)
Pt would like to have his lab work at his primary care doctor in [place] please. Please fax to Dr Fayrene Fearing Hedrick's office,their fax number is [phone]. Marland KitchenPlease call pt and let him know if this is possible please.Marland Kitchen

## 2017-08-29 NOTE — Telephone Encounter (Signed)
Spoke with pt wife, lab orders given to medical records fopr faxing tomorrow. Patient made aware he will need an appointment to have labs drawn.

## 2017-08-31 DIAGNOSIS — H4912 Fourth [trochlear] nerve palsy, left eye: Secondary | ICD-10-CM | POA: Diagnosis not present

## 2017-09-02 DIAGNOSIS — I2510 Atherosclerotic heart disease of native coronary artery without angina pectoris: Secondary | ICD-10-CM | POA: Diagnosis not present

## 2017-09-02 DIAGNOSIS — E785 Hyperlipidemia, unspecified: Secondary | ICD-10-CM | POA: Diagnosis not present

## 2017-09-02 DIAGNOSIS — I10 Essential (primary) hypertension: Secondary | ICD-10-CM | POA: Diagnosis not present

## 2017-09-07 DIAGNOSIS — I10 Essential (primary) hypertension: Secondary | ICD-10-CM | POA: Diagnosis not present

## 2017-09-07 DIAGNOSIS — J449 Chronic obstructive pulmonary disease, unspecified: Secondary | ICD-10-CM | POA: Diagnosis not present

## 2017-09-07 DIAGNOSIS — M2550 Pain in unspecified joint: Secondary | ICD-10-CM | POA: Diagnosis not present

## 2017-09-07 DIAGNOSIS — Z125 Encounter for screening for malignant neoplasm of prostate: Secondary | ICD-10-CM | POA: Diagnosis not present

## 2017-09-07 DIAGNOSIS — E785 Hyperlipidemia, unspecified: Secondary | ICD-10-CM | POA: Diagnosis not present

## 2017-09-07 DIAGNOSIS — E119 Type 2 diabetes mellitus without complications: Secondary | ICD-10-CM | POA: Diagnosis not present

## 2017-09-19 ENCOUNTER — Other Ambulatory Visit: Payer: Self-pay | Admitting: Cardiovascular Disease

## 2017-09-22 DIAGNOSIS — J449 Chronic obstructive pulmonary disease, unspecified: Secondary | ICD-10-CM | POA: Diagnosis not present

## 2017-09-22 DIAGNOSIS — G4733 Obstructive sleep apnea (adult) (pediatric): Secondary | ICD-10-CM | POA: Diagnosis not present

## 2017-10-07 ENCOUNTER — Telehealth: Payer: Self-pay | Admitting: Cardiovascular Disease

## 2017-10-07 NOTE — Telephone Encounter (Signed)
Spoke with pt wife, aware the patient is due to be seen. Follow up scheduled first available 12-22-17. She reports the patient has no energy, he is having pain but it is not his heart. Offered for patient to see the APP and she declined. She will call back if needed prior to appointment.

## 2017-10-07 NOTE — Telephone Encounter (Signed)
New message   Patient wife called in stating that he was suppose to have a 6 month f/u with Tresa Endo but no recall or active request are shown please advise

## 2017-10-15 ENCOUNTER — Other Ambulatory Visit: Payer: Self-pay | Admitting: Cardiovascular Disease

## 2017-10-23 DIAGNOSIS — J449 Chronic obstructive pulmonary disease, unspecified: Secondary | ICD-10-CM | POA: Diagnosis not present

## 2017-10-23 DIAGNOSIS — G4733 Obstructive sleep apnea (adult) (pediatric): Secondary | ICD-10-CM | POA: Diagnosis not present

## 2017-10-27 DIAGNOSIS — J441 Chronic obstructive pulmonary disease with (acute) exacerbation: Secondary | ICD-10-CM | POA: Diagnosis not present

## 2017-11-12 ENCOUNTER — Other Ambulatory Visit: Payer: Self-pay | Admitting: Cardiovascular Disease

## 2017-11-21 DIAGNOSIS — J441 Chronic obstructive pulmonary disease with (acute) exacerbation: Secondary | ICD-10-CM | POA: Diagnosis not present

## 2017-11-21 DIAGNOSIS — R05 Cough: Secondary | ICD-10-CM | POA: Diagnosis not present

## 2017-11-22 ENCOUNTER — Ambulatory Visit: Payer: PPO | Admitting: Internal Medicine

## 2017-11-22 ENCOUNTER — Encounter: Payer: Self-pay | Admitting: Internal Medicine

## 2017-11-22 VITALS — BP 178/110 | HR 89 | Ht 67.0 in | Wt 232.0 lb

## 2017-11-22 DIAGNOSIS — J9611 Chronic respiratory failure with hypoxia: Secondary | ICD-10-CM | POA: Diagnosis not present

## 2017-11-22 MED ORDER — PREDNISONE 20 MG PO TABS: 20.0000 mg | ORAL_TABLET | Freq: Every day | ORAL | 0 refills | Status: DC

## 2017-11-22 NOTE — Patient Instructions (Signed)
Refer to lung cancer screening Re-start SYMBICORT Nebulized therapy as needed Prednisone 20 mg daily for 7 days
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
## 2017-11-23 DIAGNOSIS — G4733 Obstructive sleep apnea (adult) (pediatric): Secondary | ICD-10-CM | POA: Diagnosis not present

## 2017-11-23 DIAGNOSIS — J449 Chronic obstructive pulmonary disease, unspecified: Secondary | ICD-10-CM | POA: Diagnosis not present

## 2017-12-21 DIAGNOSIS — G4733 Obstructive sleep apnea (adult) (pediatric): Secondary | ICD-10-CM | POA: Diagnosis not present

## 2017-12-21 DIAGNOSIS — J449 Chronic obstructive pulmonary disease, unspecified: Secondary | ICD-10-CM | POA: Diagnosis not present

## 2017-12-22 ENCOUNTER — Encounter: Payer: Self-pay | Admitting: Cardiovascular Disease

## 2017-12-22 ENCOUNTER — Ambulatory Visit: Payer: PPO | Admitting: Cardiovascular Disease

## 2017-12-22 VITALS — BP 106/70 | HR 75 | Ht 67.0 in | Wt 237.0 lb

## 2017-12-22 DIAGNOSIS — E785 Hyperlipidemia, unspecified: Secondary | ICD-10-CM

## 2017-12-22 DIAGNOSIS — Z79899 Other long term (current) drug therapy: Secondary | ICD-10-CM

## 2017-12-22 DIAGNOSIS — Z951 Presence of aortocoronary bypass graft: Secondary | ICD-10-CM

## 2017-12-22 DIAGNOSIS — G4733 Obstructive sleep apnea (adult) (pediatric): Secondary | ICD-10-CM | POA: Diagnosis not present

## 2017-12-22 DIAGNOSIS — I10 Essential (primary) hypertension: Secondary | ICD-10-CM | POA: Diagnosis not present

## 2017-12-22 DIAGNOSIS — Z9989 Dependence on other enabling machines and devices: Secondary | ICD-10-CM

## 2017-12-22 DIAGNOSIS — R0602 Shortness of breath: Secondary | ICD-10-CM

## 2017-12-22 DIAGNOSIS — I2510 Atherosclerotic heart disease of native coronary artery without angina pectoris: Secondary | ICD-10-CM | POA: Diagnosis not present

## 2017-12-22 MED ORDER — SPIRONOLACTONE 25 MG PO TABS: 12.5000 mg | ORAL_TABLET | Freq: Every day | ORAL | 3 refills | Status: DC

## 2017-12-22 NOTE — Progress Notes (Signed)
Patient ID: Daniel Mays, male   DOB: [birth year], 77 y.o.   MRN: [id number]     HPI: Daniel Mays is a 77 y.o. male who presents to the office today for an 8 month follow up evaluation.   Daniel Mays has known CAD and in June 2007 suffered an inferior wall ST segment elevation myocardial infarction which was complicated by recurrent episodes of ventricular fibrillation requiring numerous to defibrillations, CPR, intra-aortic balloon pump insertion and pacemaker therapy. At that time, I performed successful PTCA of a otally occluded right coronary artery with restoration of TIMI-3 flow. Due to severe concomitant CAD I recommended elective CABG surgery which was done the following day by Dr. Roxan Hockey. He underwent CABG x5 with a LIMA to the LAD, a vein to the diagonal, vein to the intermediate, sequential vein to the PDA and PLA branch of the right coronary artery. In June 2009 he underwent 2 vessel intervention involving the LAD ostium and proximal intermediate vessel. In April 2014  a myocardial perfusion study was low risk without significant scar or ischemia in only mild inferolateral thinning. Ejection fraction 61%. He had normal wall motion. An echo Doppler study on 02/27/2014, which showed an ejection fraction of 45-50% without wall motion abnormality.  Estimated pulmonary pressure was normal.  He has a history of hyperlipidemia, obesity, and mild renal artery stenosis on duplex imaging.  He has a history of complex sleep apnea, and had not been on CPAP therapy for several years. He  was referred for a follow-up sleep study on 07/20/2013. This was done in a split-night protocol due to severe sleep apnea. His overall AHI was 65.7 per hour. He was unable to reach REM sleep in the baseline portion of the study. Oxygen dropped to 86% during non-REM sleep and has evidence for loud snoring. He was titrated to 13 cm water pressure but due to development of significant central events, BiPAP was  started at 14/10 was increased to 15/11 cm. He received an AirSense 10 ResMed unit on 10/26/2013.  When I last saw him, he was having difficulty in significant changes were made teamed.  I reduced his EPAP min to  8 but he had the potential to increase to 18/14 if necessary.  We also changed him to a heated coil hose for improved humidification.  Subsequently, he has been sleeping significantly better.  He denies residual daytime sleepiness.  He is unaware of breakthrough snoring.  Presently he only minutes to infrequent use.  He was admitted to [facility] 07/28/2014 and was discharged 2 days later.  He presented with dysarthria, left face weakness, and confusion.  A CT of his head was negative for acute stroke.  Neurology was consulted.  An MRI of his brain was also negative for acute stroke but showed remote right MCA territory infarct.  A subsequent echo showed an EF of 60-65%.  Carotid Dopplers revealed a 40-59% internal carotid stenosis on the right and left 1 a 39% with antegrade flow.  Lipid studies showed an LDL cholesterol of 54 total cholesterol 120, triglycerides were elevated at 184 and HDL was low at 29.    He was seen in the office in April 2018 by [name] with shortness of breath.  The patient had also seen Dr. Raul Del in [place] and was treated with a course of antibiotic and steroid therapy.  He responded to steroid therapy.  When he saw [facility] follow[facility]up.  He was given a short course of increased diuretic regimen  for several days but was uncertain if his shortness of breath was pulmonary or cardiac etiology.  Subsequent, he has felt improved.  He denies any recent wheezing.  He denies fevers, chills or night sweats.  He denies significant leg swelling.  He admits to 100% compliance with his CPAP therapy for his obstructive sleep apnea.  He tells me he was started on supplemental oxygen at night, which is black into his CPAP machine.   I last saw him in July 2018.  Recently, he has  noticed increasing ankle edema.  He also has had difficulty moving his arm.  He does not exercise.  He admits to increasing shortness of breath with minimal activity.  He is not walking.  He sees Dr. Kary Kos in [place] for primary care.  He is not had recent laboratory.  He presents for reevaluation.  Past Medical History:  Diagnosis Date  . Arthritis    knees  . CAD (coronary artery disease)    2D ECHO, 03/17/2010 - EF 45-50%, normal  . Chronic kidney disease    blockage in renal artery  . Diabetes mellitus without complication (HCC)    diet controlled  . GERD (gastroesophageal reflux disease)   . Hard of hearing   . Heart attack ([place]) 2007  . Hyperlipidemia   . Hypertension   . OSA (obstructive sleep apnea)    CPAP  . Wears dentures    full upper and lower    Past Surgical History:  Procedure Laterality Date  . CARDIAC CATHETERIZATION  04/01/2008   LAD ostium stented with a 2.5x41m Promus DES stent reducing a 95% stenosis to 0%; Proximal intermediate Ramus stented with a [id number] drug-eluting stent reducing a 85% stenosis to 0% residual  . CARDIAC CATHETERIZATION  03/26/2008   Increased medical management  . CARDIAC CATHETERIZATION  04/27/2006   CABG recommended  . CATARACT EXTRACTION W/PHACO Left 04/16/2015   Procedure: CATARACT EXTRACTION PHACO AND INTRAOCULAR LENS PLACEMENT (IOC);  Surgeon: ChLeandrew KoyanagiMD;  [facility] Service: Ophthalmology;  Laterality: Left;  CPAP DIABETIC  . CORONARY ANGIOPLASTY WITH STENT PLACEMENT  2010  . CORONARY ARTERY BYPASS GRAFT  04/29/2006   LIMA to LAD, SVG to first diagonal, SVG to ramus intermedius, SVG to PDA, and SVG to PLA branch of RCA  . EXERCISE STRESS TEST  01/18/2013   Small fixed inferolateral defect-artifact is favored. No ischemia  . EYE SURGERY  1987  . TEE WITHOUT CARDIOVERSION  04/29/2006    No Known Allergies  Current Outpatient Medications  Medication Sig Dispense Refill  .  acetaminophen (TYLENOL) 500 MG tablet Take 500 mg by mouth 3 (three) times daily.    . Marland Kitchenlbuterol (PROAIR HFA) 108 (90 Base) MCG/ACT inhaler Inhale 90 mcg into the lungs.    . Marland Kitchenlbuterol (PROVENTIL) (2.5 MG/3ML) 0.083% nebulizer solution Take 3 mLs (2.5 mg total) by nebulization every 6 (six) hours as needed for wheezing or shortness of breath. DX: COPD DX Code: J44.9 75 mL 12  . aspirin EC 81 MG tablet Take 81 mg by mouth daily. AM    . budesonide-formoterol (SYMBICORT) 160-4.5 MCG/ACT inhaler Inhale 2 puffs into the lungs 2 (two) times daily. 1 Inhaler 0  . clopidogrel (PLAVIX) 75 MG tablet TAKE 1 TABLET BY MOUTH ONCE DAILY 90 tablet 0  . donepezil (ARICEPT) 5 MG tablet Take 5 mg by mouth daily.    . furosemide (LASIX) 40 MG tablet TAKE 1 TABLET BY MOUTH ONCE DAILY 30 tablet 6  .  isosorbide mononitrate (IMDUR) 60 MG 24 hr tablet TAKE 1 TABLET BY MOUTH ONCE DAILY 90 tablet 1  . KLOR-CON M20 20 MEQ tablet TAKE ONE TABLET BY MOUTH ONCE DAILY 90 tablet 3  . metoprolol tartrate (LOPRESSOR) 50 MG tablet TAKE 1 TABLET BY MOUTH TWICE DAILY 180 tablet 3  . pantoprazole (PROTONIX) 40 MG tablet Take 1 tablet (40 mg total) by mouth daily. 90 tablet 3  . simvastatin (ZOCOR) 20 MG tablet TAKE ONE TABLET BY MOUTH AT BEDTIME 90 tablet 3  . spironolactone (ALDACTONE) 25 MG tablet Take 0.5 tablets (12.5 mg total) by mouth daily. 45 tablet 3   No current facility-administered medications for this visit.     Social History   Socioeconomic History  . Marital status: Married    Spouse name: Not on file  . Number of children: Not on file  . Years of education: Not on file  . Highest education level: Not on file  Occupational History  . Occupation: Retired Nurse, learning disability  Social Needs  . Financial resource strain: Not on file  . Food insecurity:    Worry: Not on file    Inability: Not on file  . Transportation needs:    Medical: Not on file    Non-medical: Not on file  Tobacco Use  . Smoking  status: Former Smoker    Packs/day: 5.00    Years: 55.00    Pack years: 275.00    Types: Cigarettes    Start date: 10/04/1946    Last attempt to quit: 10/04/2001    Years since quitting: 16.2  . Smokeless tobacco: Never Used  Substance and Sexual Activity  . Alcohol use: Yes    Alcohol/week: 1.8 oz    Types: 3 Shots of liquor per week    Comment: occ  . Drug use: No  . Sexual activity: Not on file  Lifestyle  . Physical activity:    Days per week: Not on file    Minutes per session: Not on file  . Stress: Not on file  Relationships  . Social connections:    Talks on phone: Not on file    Gets together: Not on file    Attends religious service: Not on file    Active member of club or organization: Not on file    Attends meetings of clubs or organizations: Not on file    Relationship status: Not on file  . Intimate partner violence:    Fear of current or ex partner: Not on file    Emotionally abused: Not on file    Physically abused: Not on file    Forced sexual activity: Not on file  Other Topics Concern  . Not on file  Social History Narrative  . Not on file    Family History  Problem Relation Age of Onset  . Parkinson's disease Mother   . Diabetes Mother     ROS General: Negative; No fevers, chills, or night sweats HEENT: Negative; No changes in vision or hearing, sinus congestion, difficulty swallowing Pulmonary: Previous shortness of breath which improved with antibiotics and steroid taper Cardiovascular: See HPI: No chest pain, presyncope, syncope, palpatations Positive for leg edema, which has improved, but still is present GI: Positive for GERD No nausea, vomiting, diarrhea, or abdominal pain GU: Negative; No dysuria, hematuria, or difficulty voiding Musculoskeletal: Negative; no myalgias, joint pain, or weakness Hematologic: Negative; no easy bruising, bleeding Endocrine: Negative; no heat/cold intolerance; no diabetes, Neuro: see history of present  illness  Skin: Negative; No rashes or skin lesions Psychiatric: Negative; No behavioral problems, depression Sleep: Positive for complex sleep apnea with current BiPAP settings no snoring,  daytime sleepiness, hypersomnolence, bruxism, restless legs, hypnogognic hallucinations. Other comprehensive 14 point system review is negative   Physical Exam BP 106/70   Pulse 75   Ht '5\' 7"'  (1.702 m)   Wt 237 lb (107.5 kg)   BMI 37.12 kg/m    Repeat blood pressure by me was 124/74.  Wt Readings from Last 3 Encounters:  12/22/17 237 lb (107.5 kg)  11/22/17 232 lb (105.2 kg)  05/03/17 225 lb 12.8 oz (102.4 kg)   General: Alert, oriented, no distress. obese Skin: normal turgor, no rashes, warm and dry HEENT: Normocephalic, atraumatic. Pupils equal round and reactive to light; sclera anicteric; extraocular muscles intact;  Nose without nasal septal hypertrophy Mouth/Parynx benign; Mallinpatti scale 3/4 Neck: No JVD, no carotid bruits; normal carotid upstroke Lungs: clear to ausculatation and percussion; no wheezing or rales Chest wall: without tenderness to palpitation Heart: PMI not displaced, RRR, s1 s2 normal, 1/6 systolic murmur, no diastolic murmur, no rubs, gallops, thrills, or heaves Abdomen: moderate diastases recti;  moderate central adiposity.soft, nontender; no hepatosplenomehaly, BS+; abdominal aorta nontender and not dilated by palpation. Back: no CVA tenderness Pulses 2+ Musculoskeletal: full range of motion, normal strength, no joint deformities Extremities: bilateral ankle edema;no clubbing cyanosis, Homan's sign negative  Neurologic: grossly nonfocal; Cranial nerves grossly wnl Psychologic: Normal mood and affect   ECG (independently read by me): normal sinus rhythm at 75 bpm.  Nonspecific ST changes.  Normal intervals.  No ectopy.  July 2018 ECG (independently read by me): Sinus bradycardia at 52 bpm.  First-degree AV block with a PR interval of 224 ms.  QTc interval  normal.  No significant ST-T changes  March 2017 ECG (independently read by me):  Sinus rhythm with an occasional PVC. Nonspecific ST changes. Borderline first-degree AV block  Novembr 2015 ECG (independently read by me): Normal sinus rhythm with first degree AV block with a PR interval at 210 ms.  No significant ST segment changes.  June 2015 ECG (independently read by me): Normal sinus rhythm at 68 beats per minute.  Mild first degree AV block with PR interval 208 ms.  Nonspecific ST-T change  LABS:  BMP Latest Ref Rng & Units 01/26/2017 01/12/2017 07/29/2014  Glucose 65 - 99 mg/dL 107(H) 111(H) 165(H)  BUN 7 - 25 mg/dL '20 22 20  ' Creatinine 0.70 - 1.18 mg/dL 1.47(H) 1.46(H) 1.24  BUN/Creat Ratio 10 - 22 - - -  Sodium 135 - 146 mmol/L 142 144 140  Potassium 3.5 - 5.3 mmol/L 4.2 4.7 3.5(L)  Chloride 98 - 110 mmol/L 103 106 103  CO2 20 - 31 mmol/L '26 29 24  ' Calcium 8.6 - 10.3 mg/dL 9.8 8.8 8.9   Hepatic Function Latest Ref Rng & Units 07/28/2014 04/17/2014 07/12/2013  Total Protein 6.0 - 8.3 g/dL 7.7 7.0 6.8  Albumin 3.5 - 5.2 g/dL 4.1 4.5 4.5  AST 0 - 37 U/L '27 23 24  ' ALT 0 - 53 U/L '24 23 21  ' Alk Phosphatase 39 - 117 U/L 44 47 45  Total Bilirubin 0.3 - 1.2 mg/dL 0.3 0.4 0.3   CBC Latest Ref Rng & Units 07/28/2014 04/17/2014 07/12/2013  WBC 4.0 - 10.5 K/uL 8.6 7.3 8.9  Hemoglobin 13.0 - 17.0 g/dL 13.7 14.5 13.9  Hematocrit 39.0 - 52.0 % 41.5 42.7 41.8  Platelets 150 - 400 K/uL 278 282  268   Lab Results  Component Value Date   MCV 89.1 07/28/2014   MCV 85.6 04/17/2014   MCV 87 07/12/2013    Lab Results  Component Value Date   TSH 1.357 04/17/2014   Lab Results  Component Value Date   HGBA1C 6.4 (H) 07/28/2014   Lipid Panel     Component Value Date/Time   CHOL 120 07/29/2014 0047   CHOL 146 07/12/2013 0935   TRIG 184 (H) 07/29/2014 0047   HDL 29 (L) 07/29/2014 0047   HDL 38 (L) 07/12/2013 0935   CHOLHDL 4.1 07/29/2014 0047   VLDL 37 07/29/2014 0047   LDLCALC 54  07/29/2014 0047   LDLCALC 66 07/12/2013 0935   RADIOLOGY: No results found.  IMPRESSION: 1. Coronary artery disease involving native coronary artery of native heart without angina pectoris   2. Hx of CABG   3. Hyperlipidemia with target LDL less than 70   4. Essential hypertension   5. Medication management   6. SOB (shortness of breath)   7. OSA on CPAP     ASSESSMENT AND PLAN: Mr. Nehme is a 77 year old gentleman who is status post remote MI with cardiac arrest, requiring numerous defibrillations for ventricular fibrillation, CPR, IABP insertion,and  pacemaker therapy in 2007.  He is status post CABG revascularization surgery following successful PTCA to his totally occluded RCA with resumption of TIMI-3 flow for myocardial salvage prior to his current CABG surgery. Remotely, he had developed significant peripheral edema on amlodipine leading to its discontinuance. Recently, he has noticed increasing episodes of shortness of breath.  He is not able to walk.  He also admits to ankle swelling.  His last echo Doppler study in 2015 showed an EF of 45-50% without focal wall motion abnormality and pulmonary pressures were normal. With his increasing shortness of breath, I am recommending a follow-up echo Doppler study.  He is now 12 years following his cardiac arrest, gated by IVF, initial cardiogenic shock.  I am recommending he undergo a Comer study to make certain his progressive dyspnea is not ischemia mediated.  His blood pressure today is controlled on metoprolol 50 mg twice a day, isosorbide 60 g daily, and furosemide 40 mg daily. He continues to be on dual antiplatelet therapy with aspirin and Plavix.  He has COPD and is on pro-air and albuterol in addition to Symbicort.  He has been maintained on simvastatin 20 mg for hyperlipidemia.  Target LDL is less than 70. He is not had recent laboratory in a complete set of fasting blood work will be obtained.  With his increasing shortness  of breath, as well as ankle edema.  I am adding spironolactone 12.5 mg daily for aldosterone blockade.  I discussed the importance of sodium restriction. He continues to be on pantoprazole for GERD.  We also discussed the importance of weight loss and potential exercise as tolerated. I will contact him regarding his laboratory.he admits to continued CPAP use for his obstructive sleep apnea.  Adjustments to his medical regimen are necessary.I will see him in 6 weeks for reevaluation or sooner as needed.   Time spent: 25 minutes  Troy Sine, MD, [facility]  12/23/2017 6:37 PM

## 2017-12-22 NOTE — Patient Instructions (Signed)
Medication Instructions:  START spironolactone 12.5 mg (1/2 tablet) daily  Labwork: Please return for FASTING labs  (CMET, CBC, Lipid, TSH, BNP)  Our in office lab hours are Monday-Friday 8:00-4:00, closed for lunch 12:45-1:45 pm.  No appointment needed.   Testing/Procedures: Your physician has requested that you have a lexiscan myoview. For further information please visit [email or web address] Please follow instruction sheet, as given.  Your physician has requested that you have an echocardiogram. Echocardiography is a painless test that uses sound waves to create images of your heart. It provides your doctor with information about the size and shape of your heart and how well your heart's chambers and valves are working. This procedure takes approximately one hour. There are no restrictions for this procedure.  This will be done at our [facility] location:  [address]  Follow-Up: 6-8 weeks with Dr. Tresa Endo  Any Other Special Instructions Will Be Listed Below (If Applicable).     If you need a refill on your cardiac medications before your next appointment, please call your pharmacy.

## 2017-12-23 ENCOUNTER — Encounter: Payer: Self-pay | Admitting: Cardiovascular Disease

## 2017-12-23 ENCOUNTER — Telehealth (HOSPITAL_COMMUNITY): Payer: Self-pay

## 2017-12-23 NOTE — Telephone Encounter (Signed)
Encounter complete. 

## 2017-12-27 ENCOUNTER — Ambulatory Visit (HOSPITAL_COMMUNITY)
Admission: RE | Admit: 2017-12-27 | Discharge: 2017-12-27 | Disposition: A | Discharge status: Home / Self Care | Payer: PPO | Source: Ambulatory Visit | Attending: Cardiovascular Disease | Admitting: Cardiovascular Disease

## 2017-12-27 DIAGNOSIS — Z79899 Other long term (current) drug therapy: Secondary | ICD-10-CM | POA: Diagnosis not present

## 2017-12-27 DIAGNOSIS — E785 Hyperlipidemia, unspecified: Secondary | ICD-10-CM | POA: Diagnosis not present

## 2017-12-27 DIAGNOSIS — Z951 Presence of aortocoronary bypass graft: Secondary | ICD-10-CM | POA: Diagnosis not present

## 2017-12-27 DIAGNOSIS — R0602 Shortness of breath: Secondary | ICD-10-CM | POA: Diagnosis not present

## 2017-12-27 DIAGNOSIS — I2510 Atherosclerotic heart disease of native coronary artery without angina pectoris: Secondary | ICD-10-CM | POA: Diagnosis not present

## 2017-12-27 DIAGNOSIS — I10 Essential (primary) hypertension: Secondary | ICD-10-CM | POA: Diagnosis not present

## 2017-12-27 LAB — MYOCARDIAL PERFUSION IMAGING
CHL CUP NUCLEAR SRS: 5
CHL CUP NUCLEAR SSS: 6
LV dias vol: 108 mL (ref 62–150)
LVSYSVOL: 54 mL
NUC STRESS TID: 1.2
Peak HR: 79 {beats}/min
Rest HR: 56 {beats}/min
SDS: 1

## 2017-12-27 MED ORDER — TECHNETIUM TC 99M TETROFOSMIN IV KIT
30.3000 | PACK | Freq: Once | INTRAVENOUS | Status: AC | PRN
  Administered 2017-12-27: 30.3 via INTRAVENOUS
  Filled 2017-12-27: qty 31

## 2017-12-27 MED ORDER — TECHNETIUM TC 99M TETROFOSMIN IV KIT
9.7000 | PACK | Freq: Once | INTRAVENOUS | Status: AC | PRN
  Administered 2017-12-27: 9.7 via INTRAVENOUS
  Filled 2017-12-27: qty 10

## 2017-12-27 MED ORDER — REGADENOSON 0.4 MG/5ML IV SOLN
0.4000 mg | Freq: Once | INTRAVENOUS | Status: AC
  Administered 2017-12-27: 0.4 mg via INTRAVENOUS

## 2017-12-28 LAB — COMPREHENSIVE METABOLIC PANEL
ALK PHOS: 35 IU/L — AB (ref 39–117)
ALT: 17 IU/L (ref 0–44)
AST: 26 IU/L (ref 0–40)
Albumin/Globulin Ratio: 1.8 (ref 1.2–2.2)
Albumin: 4.5 g/dL (ref 3.5–4.8)
BUN/Creatinine Ratio: 15 (ref 10–24)
BUN: 16 mg/dL (ref 8–27)
Bilirubin Total: 0.4 mg/dL (ref 0.0–1.2)
CALCIUM: 9.2 mg/dL (ref 8.6–10.2)
CO2: 24 mmol/L (ref 20–29)
CREATININE: 1.07 mg/dL (ref 0.76–1.27)
Chloride: 104 mmol/L (ref 96–106)
GFR calc Af Amer: 76 mL/min/{1.73_m2} (ref >59)
GFR, EST NON AFRICAN AMERICAN: 66 mL/min/{1.73_m2} (ref >59)
GLUCOSE: 95 mg/dL (ref 65–99)
Globulin, Total: 2.5 g/dL (ref 1.5–4.5)
Potassium: 4.6 mmol/L (ref 3.5–5.2)
Sodium: 145 mmol/L — ABNORMAL HIGH (ref 134–144)
Total Protein: 7 g/dL (ref 6.0–8.5)

## 2017-12-28 LAB — TSH: TSH: 1.37 u[IU]/mL (ref 0.450–4.500)

## 2017-12-28 LAB — CBC
HEMOGLOBIN: 14.2 g/dL (ref 13.0–17.7)
Hematocrit: 41.8 % (ref 37.5–51.0)
MCH: 30.7 pg (ref 26.6–33.0)
MCHC: 34 g/dL (ref 31.5–35.7)
MCV: 90 fL (ref 79–97)
Platelets: 316 10*3/uL (ref 150–379)
RBC: 4.63 x10E6/uL (ref 4.14–5.80)
RDW: 13.8 % (ref 12.3–15.4)
WBC: 7.3 10*3/uL (ref 3.4–10.8)

## 2017-12-28 LAB — BRAIN NATRIURETIC PEPTIDE: BNP: 93 pg/mL (ref 0.0–100.0)

## 2017-12-28 LAB — LIPID PANEL
Chol/HDL Ratio: 4 ratio (ref 0.0–5.0)
Cholesterol, Total: 135 mg/dL (ref 100–199)
HDL: 34 mg/dL — AB (ref >39)
LDL Calculated: 64 mg/dL (ref 0–99)
TRIGLYCERIDES: 183 mg/dL — AB (ref 0–149)
VLDL CHOLESTEROL CAL: 37 mg/dL (ref 5–40)

## 2018-01-04 ENCOUNTER — Ambulatory Visit (HOSPITAL_COMMUNITY): Payer: PPO | Attending: Cardiovascular Disease

## 2018-01-04 ENCOUNTER — Other Ambulatory Visit: Payer: Self-pay

## 2018-01-04 DIAGNOSIS — R0600 Dyspnea, unspecified: Secondary | ICD-10-CM | POA: Insufficient documentation

## 2018-01-04 DIAGNOSIS — Z951 Presence of aortocoronary bypass graft: Secondary | ICD-10-CM

## 2018-01-04 DIAGNOSIS — I10 Essential (primary) hypertension: Secondary | ICD-10-CM | POA: Insufficient documentation

## 2018-01-04 DIAGNOSIS — I2510 Atherosclerotic heart disease of native coronary artery without angina pectoris: Secondary | ICD-10-CM

## 2018-01-04 DIAGNOSIS — R0602 Shortness of breath: Secondary | ICD-10-CM

## 2018-01-04 DIAGNOSIS — G4733 Obstructive sleep apnea (adult) (pediatric): Secondary | ICD-10-CM | POA: Diagnosis not present

## 2018-01-04 DIAGNOSIS — E785 Hyperlipidemia, unspecified: Secondary | ICD-10-CM | POA: Insufficient documentation

## 2018-01-04 DIAGNOSIS — E119 Type 2 diabetes mellitus without complications: Secondary | ICD-10-CM | POA: Insufficient documentation

## 2018-01-04 DIAGNOSIS — I252 Old myocardial infarction: Secondary | ICD-10-CM | POA: Diagnosis not present

## 2018-01-21 DIAGNOSIS — G4733 Obstructive sleep apnea (adult) (pediatric): Secondary | ICD-10-CM | POA: Diagnosis not present

## 2018-01-21 DIAGNOSIS — J449 Chronic obstructive pulmonary disease, unspecified: Secondary | ICD-10-CM | POA: Diagnosis not present

## 2018-02-14 ENCOUNTER — Other Ambulatory Visit: Payer: Self-pay | Admitting: Cardiovascular Disease

## 2018-02-15 ENCOUNTER — Encounter: Payer: Self-pay | Admitting: Cardiovascular Disease

## 2018-02-17 ENCOUNTER — Ambulatory Visit: Payer: PPO | Admitting: Cardiovascular Disease

## 2018-02-17 ENCOUNTER — Encounter: Payer: Self-pay | Admitting: Cardiovascular Disease

## 2018-02-17 VITALS — BP 132/72 | HR 72 | Ht 67.0 in | Wt 231.4 lb

## 2018-02-17 DIAGNOSIS — I2510 Atherosclerotic heart disease of native coronary artery without angina pectoris: Secondary | ICD-10-CM | POA: Diagnosis not present

## 2018-02-17 DIAGNOSIS — Z951 Presence of aortocoronary bypass graft: Secondary | ICD-10-CM

## 2018-02-17 DIAGNOSIS — E785 Hyperlipidemia, unspecified: Secondary | ICD-10-CM

## 2018-02-17 DIAGNOSIS — G4733 Obstructive sleep apnea (adult) (pediatric): Secondary | ICD-10-CM | POA: Diagnosis not present

## 2018-02-17 DIAGNOSIS — Z9989 Dependence on other enabling machines and devices: Secondary | ICD-10-CM

## 2018-02-17 DIAGNOSIS — R0602 Shortness of breath: Secondary | ICD-10-CM

## 2018-02-17 DIAGNOSIS — I10 Essential (primary) hypertension: Secondary | ICD-10-CM | POA: Diagnosis not present

## 2018-02-17 NOTE — Patient Instructions (Signed)
Medication Instructions:  Ok to take additional 12.5 mg spironolactone AS NEEDED for swelling  Follow-Up: Your physician wants you to follow-up in: 6 months with Dr. Tresa Endo.  You will receive a reminder letter in the mail two months in advance. If you don't receive a letter, please call our office to schedule the follow-up appointment.   Any Other Special Instructions Will Be Listed Below (If Applicable).     If you need a refill on your cardiac medications before your next appointment, please call your pharmacy.

## 2018-02-17 NOTE — Progress Notes (Signed)
Patient ID: Daniel Mays, male   DOB: 03-Sep-1941, 77 y.o.   MRN: [id number]     HPI: Daniel Mays is a 77 y.o. male who presents to the office today for a 2 month follow up evaluation.  Daniel Mays has known CAD and in June 2007 suffered an inferior wall ST segment elevation myocardial infarction which was complicated by recurrent episodes of ventricular fibrillation requiring numerous to defibrillations, CPR, intra-aortic balloon pump insertion and pacemaker therapy. At that time, I performed successful PTCA of a otally occluded right coronary artery with restoration of TIMI-3 flow. Due to severe concomitant CAD I recommended elective CABG surgery which was done the following day by Dr. Roxan Hockey. He underwent CABG x5 with a LIMA to the LAD, a vein to the diagonal, vein to the intermediate, sequential vein to the PDA and PLA branch of the right coronary artery. In June 2009 he underwent 2 vessel intervention involving the LAD ostium and proximal intermediate vessel. In April 2014  a myocardial perfusion study was low risk without significant scar or ischemia in only mild inferolateral thinning. Ejection fraction 61%. He had normal wall motion. An echo Doppler study on 02/27/2014, which showed an ejection fraction of 45-50% without wall motion abnormality.  Estimated pulmonary pressure was normal.  He has a history of hyperlipidemia, obesity, and mild renal artery stenosis on duplex imaging.  He has a history of complex sleep apnea, and had not been on CPAP therapy for several years. He  was referred for a follow-up sleep study on 07/20/2013. This was done in a split-night protocol due to severe sleep apnea. His overall AHI was 65.7 per hour. He was unable to reach REM sleep in the baseline portion of the study. Oxygen dropped to 86% during non-REM sleep and has evidence for loud snoring. He was titrated to 13 cm water pressure but due to development of significant central events, BiPAP was  started at 14/10 was increased to 15/11 cm. He received an AirSense 10 ResMed unit on 10/26/2013.  When I last saw him, he was having difficulty in significant changes were made teamed.  I reduced his EPAP min to  8 but he had the potential to increase to 18/14 if necessary.  We also changed him to a heated coil hose for improved humidification.  Subsequently, he has been sleeping significantly better.  He denies residual daytime sleepiness.  He is unaware of breakthrough snoring.  Presently he only minutes to infrequent use.  He was admitted to [facility] hospital 07/28/2014 and was discharged 2 days later.  He presented with dysarthria, left face weakness, and confusion.  A CT of his head was negative for acute stroke.  Neurology was consulted.  An MRI of his brain was also negative for acute stroke but showed remote right MCA territory infarct.  A subsequent echo showed an EF of 60-65%.  Carotid Dopplers revealed a 40-59% internal carotid stenosis on the right and left 1 a 39% with antegrade flow.  Lipid studies showed an LDL cholesterol of 54 total cholesterol 120, triglycerides were elevated at 184 and HDL was low at 29.    He was seen in the office in April 2018 by [name] with shortness of breath.  The patient had also seen Dr. Raul Del in [place] and was treated with a course of antibiotic and steroid therapy.  He responded to steroid therapy.  When he saw Daniel Mays follow-up.  He was given a short course of increased diuretic regimen for  several days but was uncertain if his shortness of breath was pulmonary or cardiac etiology.  Subsequent, he has felt improved.  He denies any recent wheezing.  He denies fevers, chills or night sweats.  He denies significant leg swelling.  He admits to 100% compliance with his CPAP therapy for his obstructive sleep apnea.  He tells me he was started on supplemental oxygen at night, which is black into his CPAP machine.   I last saw him in March 2019, he had noticed  increasing ankle edema.  He also has had difficulty moving his arm.  He does not exercise.  He admits to increasing shortness of breath with minimal activity.  He is not walking.  He sees Dr. Kary Kos in [place].  He underwent a stress test on December 27, 2017.  EF was 50%.  There were no ST segment changes.  He had normal perfusion.  An echo Doppler study on January 04, 2018 showed an EF at 50 to 55% without wall motion abnormalities.  There was mild LVH.  Mild diastolic parameters.  There was no significant valvular pathology.  Recent laboratory a BNP of 93.  Normal renal function and LFTs.  Hemoglobin and hematocrit were stable.  Lipids were notable for elevated triglycerides at 183 and low HDL at 34.  Total cholesterol was 135 and LDL was 64.  TSH was normal.  He use BiPAP therapy with 100% compliance.  He presents for reevaluation.  Past Medical History:  Diagnosis Date  . Arthritis    knees  . CAD (coronary artery disease)    2D ECHO, 03/17/2010 - EF 45-50%, normal  . Chronic kidney disease    blockage in renal artery  . Diabetes mellitus without complication (HCC)    diet controlled  . GERD (gastroesophageal reflux disease)   . Hard of hearing   . Heart attack ([place]) 2007  . Hyperlipidemia   . Hypertension   . OSA (obstructive sleep apnea)    CPAP  . Wears dentures    full upper and lower    Past Surgical History:  Procedure Laterality Date  . CARDIAC CATHETERIZATION  04/01/2008   LAD ostium stented with a 2.5x39m Promus DES stent reducing a 95% stenosis to 0%; Proximal intermediate Ramus stented with a [id number] drug-eluting stent reducing a 85% stenosis to 0% residual  . CARDIAC CATHETERIZATION  03/26/2008   Increased medical management  . CARDIAC CATHETERIZATION  04/27/2006   CABG recommended  . CATARACT EXTRACTION W/PHACO Left 04/16/2015   Procedure: CATARACT EXTRACTION PHACO AND INTRAOCULAR LENS PLACEMENT (IOC);  Surgeon: ChLeandrew KoyanagiMD;  [facility] Service: Ophthalmology;  Laterality: Left;  CPAP DIABETIC  . CORONARY ANGIOPLASTY WITH STENT PLACEMENT  2010  . CORONARY ARTERY BYPASS GRAFT  04/29/2006   LIMA to LAD, SVG to first diagonal, SVG to ramus intermedius, SVG to PDA, and SVG to PLA branch of RCA  . EXERCISE STRESS TEST  01/18/2013   Small fixed inferolateral defect-artifact is favored. No ischemia  . EYE SURGERY  1987  . TEE WITHOUT CARDIOVERSION  04/29/2006    No Known Allergies  Current Outpatient Medications  Medication Sig Dispense Refill  . acetaminophen (TYLENOL) 500 MG tablet Take 500 mg by mouth 3 (three) times daily.    . Marland Kitchenlbuterol (PROAIR HFA) 108 (90 Base) MCG/ACT inhaler Inhale 90 mcg into the lungs.    . Marland Kitchenlbuterol (PROVENTIL) (2.5 MG/3ML) 0.083% nebulizer solution Take 3 mLs (2.5 mg total) by nebulization every 6 (  six) hours as needed for wheezing or shortness of breath. DX: COPD DX Code: J44.9 75 mL 12  . aspirin EC 81 MG tablet Take 81 mg by mouth daily. AM    . budesonide-formoterol (SYMBICORT) 160-4.5 MCG/ACT inhaler Inhale 2 puffs into the lungs 2 (two) times daily. 1 Inhaler 0  . clopidogrel (PLAVIX) 75 MG tablet TAKE 1 TABLET BY MOUTH ONCE DAILY 90 tablet 3  . donepezil (ARICEPT) 5 MG tablet Take 5 mg by mouth daily.    . furosemide (LASIX) 40 MG tablet TAKE 1 TABLET BY MOUTH ONCE DAILY 30 tablet 6  . isosorbide mononitrate (IMDUR) 60 MG 24 hr tablet TAKE 1 TABLET BY MOUTH ONCE DAILY 90 tablet 1  . KLOR-CON M20 20 MEQ tablet TAKE ONE TABLET BY MOUTH ONCE DAILY 90 tablet 3  . metoprolol tartrate (LOPRESSOR) 50 MG tablet TAKE 1 TABLET BY MOUTH TWICE DAILY 180 tablet 3  . pantoprazole (PROTONIX) 40 MG tablet Take 1 tablet (40 mg total) by mouth daily. 90 tablet 3  . simvastatin (ZOCOR) 20 MG tablet TAKE ONE TABLET BY MOUTH AT BEDTIME 90 tablet 3  . spironolactone (ALDACTONE) 25 MG tablet Take 0.5 tablets (12.5 mg total) by mouth daily. 45 tablet 3   No current facility-administered medications for this  visit.     Social History   Socioeconomic History  . Marital status: Married    Spouse name: Not on file  . Number of children: Not on file  . Years of education: Not on file  . Highest education level: Not on file  Occupational History  . Occupation: Retired Nurse, learning disability  Social Needs  . Financial resource strain: Not on file  . Food insecurity:    Worry: Not on file    Inability: Not on file  . Transportation needs:    Medical: Not on file    Non-medical: Not on file  Tobacco Use  . Smoking status: Former Smoker    Packs/day: 5.00    Years: 55.00    Pack years: 275.00    Types: Cigarettes    Start date: 10/04/1946    Last attempt to quit: 10/04/2001    Years since quitting: 16.3  . Smokeless tobacco: Never Used  Substance and Sexual Activity  . Alcohol use: Yes    Alcohol/week: 1.8 oz    Types: 3 Shots of liquor per week    Comment: occ  . Drug use: No  . Sexual activity: Not on file  Lifestyle  . Physical activity:    Days per week: Not on file    Minutes per session: Not on file  . Stress: Not on file  Relationships  . Social connections:    Talks on phone: Not on file    Gets together: Not on file    Attends religious service: Not on file    Active member of club or organization: Not on file    Attends meetings of clubs or organizations: Not on file    Relationship status: Not on file  . Intimate partner violence:    Fear of current or ex partner: Not on file    Emotionally abused: Not on file    Physically abused: Not on file    Forced sexual activity: Not on file  Other Topics Concern  . Not on file  Social History Narrative  . Not on file    Family History  Problem Relation Age of Onset  . Parkinson's disease Mother   . Diabetes  Mother     ROS General: Negative; No fevers, chills, or night sweats HEENT: Negative; No changes in vision or hearing, sinus congestion, difficulty swallowing Pulmonary: Previous shortness of breath which  improved with antibiotics and steroid taper Cardiovascular: See HPI: No chest pain, presyncope, syncope, palpatations Positive for leg edema, which has improved, but still is present GI: Positive for GERD No nausea, vomiting, diarrhea, or abdominal pain GU: Negative; No dysuria, hematuria, or difficulty voiding Musculoskeletal:  joint stiffness. Hematologic: Negative; no easy bruising, bleeding Endocrine: Negative; no heat/cold intolerance; no diabetes, Neuro: see history of present illness Skin: Negative; No rashes or skin lesions Psychiatric: Negative; No behavioral problems, depression Sleep: Positive for complex sleep apnea with current BiPAP settings no snoring,  daytime sleepiness, hypersomnolence, bruxism, restless legs, hypnogognic hallucinations. Other comprehensive 14 point system review is negative   Physical Exam BP 132/72 (BP Location: Left Arm)   Pulse 72   Ht 5' 7" (1.702 m)   Wt 231 lb 6.4 oz (105 kg)   BMI 36.24 kg/m    Blood pressure by me was 124/74.  Wt Readings from Last 3 Encounters:  02/17/18 231 lb 6.4 oz (105 kg)  12/27/17 237 lb (107.5 kg)  12/22/17 237 lb (107.5 kg)   General: Alert, oriented, no distress.  Skin: normal turgor, no rashes, warm and dry HEENT: Normocephalic, atraumatic. Pupils equal round and reactive to light; sclera anicteric; extraocular muscles intact;  Nose without nasal septal hypertrophy Mouth/Parynx benign; Mallinpatti scale Neck: No JVD, no carotid bruits; normal carotid upstroke Lungs: clear to ausculatation and percussion; no wheezing or rales Chest wall: without tenderness to palpitation Heart: PMI not displaced, RRR, s1 s2 normal, 1/6 systolic murmur, no diastolic murmur, no rubs, gallops, thrills, or heaves Abdomen: Moderate diastases recti.  Soft, nontender; no hepatosplenomehaly, BS+; abdominal aorta nontender and not dilated by palpation. Back: no CVA tenderness Pulses 2+ Musculoskeletal: full range of motion, normal  strength, no joint deformities Extremities: no clubbing cyanosis or edema, Homan's sign negative  Neurologic: grossly nonfocal; Cranial nerves grossly wnl Psychologic: Normal mood and affect   ECG (independently read by me): Sinus rhythm at 72 bpm.  First-degree AV block with a PR interval of 234 ms, specific T changes.  March 2019 ECG (independently read by me): normal sinus rhythm at 75 bpm.  Nonspecific ST changes.  Normal intervals.  No ectopy.  July 2018 ECG (independently read by me): Sinus bradycardia at 52 bpm.  First-degree AV block with a PR interval of 224 ms.  QTc interval normal.  No significant ST-T changes  March 2017 ECG (independently read by me):  Sinus rhythm with an occasional PVC. Nonspecific ST changes. Borderline first-degree AV block  Novembr 2015 ECG (independently read by me): Normal sinus rhythm with first degree AV block with a PR interval at 210 ms.  No significant ST segment changes.  June 2015 ECG (independently read by me): Normal sinus rhythm at 68 beats per minute.  Mild first degree AV block with PR interval 208 ms.  Nonspecific ST-T change  LABS:  BMP Latest Ref Rng & Units 12/27/2017 01/26/2017 01/12/2017  Glucose 65 - 99 mg/dL 95 107(H) 111(H)  BUN 8 - 27 mg/dL _0 Creatinine 0.76 - 1.27 mg/dL 1.07 1.47(H) 1.46(H)  BUN/Creat Ratio 10 - 24 15 - -  Sodium 134 - 144 mmol/L 145(H) 142 144  Potassium 3.5 - 5.2 mmol/L 4.6 4.2 4.7  Chloride 96 - 106 mmol/L 104 103 106  CO2 20 -  29 mmol/L _0 Calcium 8.6 - 10.2 mg/dL 9.2 9.8 8.8   Hepatic Function Latest Ref Rng & Units 12/27/2017 07/28/2014 04/17/2014  Total Protein 6.0 - 8.5 g/dL 7.0 7.7 7.0  Albumin 3.5 - 4.8 g/dL 4.5 4.1 4.5  AST 0 - 40 IU/L _1 ALT 0 - 44 IU/L _2 Alk Phosphatase 39 - 117 IU/L 35(L) 44 47  Total Bilirubin 0.0 - 1.2 mg/dL 0.4 0.3 0.4   CBC Latest Ref Rng & Units 12/27/2017 07/28/2014 04/17/2014  WBC 3.4 - 10.8 x10E3/uL 7.3 8.6 7.3  Hemoglobin 13.0 - 17.7 g/dL  14.2 13.7 14.5  Hematocrit 37.5 - 51.0 % 41.8 41.5 42.7  Platelets 150 - 379 x10E3/uL 316 278 282   Lab Results  Component Value Date   MCV 90 12/27/2017   MCV 89.1 07/28/2014   MCV 85.6 04/17/2014    Lab Results  Component Value Date   TSH 1.370 12/27/2017   Lab Results  Component Value Date   HGBA1C 6.4 (H) 07/28/2014   Lipid Panel     Component Value Date/Time   CHOL 135 12/27/2017 1106   TRIG 183 (H) 12/27/2017 1106   HDL 34 (L) 12/27/2017 1106   CHOLHDL 4.0 12/27/2017 1106   CHOLHDL 4.1 07/29/2014 0047   VLDL 37 07/29/2014 0047   LDLCALC 64 12/27/2017 1106   RADIOLOGY: No results found.  IMPRESSION: 1. Coronary artery disease involving native coronary artery of native heart without angina pectoris   2. Hx of CABG   3. Hyperlipidemia with target LDL less than 70   4. Essential hypertension   5. SOB (shortness of breath)   6. OSA on CPAP     ASSESSMENT AND PLAN: Daniel Mays is a 77 year old gentleman who is status post remote MI with cardiac arrest, requiring numerous defibrillations for ventricular fibrillation, CPR, IABP insertion,and  pacemaker therapy in 2007.  He is status post CABG revascularization surgery following successful PTCA to his totally occluded RCA with resumption of TIMI-3 flow for myocardial salvage prior to his current CABG surgery. Remotely, he had developed significant peripheral edema on amlodipine leading to its discontinuance. An echo Doppler study in 2015 showed an EF of 45-50% without focal wall motion abnormality and pulmonary pressures were normal. With his increasing shortness of breath, I am recommending a follow-up echo Doppler study.  He is now 12 years following his cardiac arrest, gated by IVF, initial cardiogenic shock.  When I saw him  In 12/2017 he was experiencing some shortness of breath with activity.  I scheduled him for follow-up nuclear perfusion and echo Doppler assessment.  I reviewed both of the studies with him in detail  today.  His nuclear study continues to show normal perfusion and he did not have ECG changes.  His echo Doppler study revealed normal EF of motion abnormalities or significant valvular pathology.  Last saw him, I added spironolactone 12.5 mg which has been helpful with his shortness of breath and previous leg swelling.  At times he still experiences some afternoon leg swelling.  I have suggested that on those days he can take 12.5 mg spironolactone additionally in the afternoon and he will continue his 12.5 mg daily in the morning.  He continues to be treated with CPAP therapy for his obstructive sleep apnea and admits to 100% compliance.  His triglycerides have been elevated.  We discussed improved diet with reduction in sweets and carbohydrates.  He admits that he has been  eating a fair amount of cake recently.  As ;ong as he remains stable, I will see him in 6 months for reevaluation.   Time spent: 25 minutes  Troy Sine, MD, [facility]  02/19/2018 10:46 AM

## 2018-02-19 ENCOUNTER — Encounter: Payer: Self-pay | Admitting: Cardiovascular Disease

## 2018-02-20 DIAGNOSIS — G4733 Obstructive sleep apnea (adult) (pediatric): Secondary | ICD-10-CM | POA: Diagnosis not present

## 2018-02-20 DIAGNOSIS — J449 Chronic obstructive pulmonary disease, unspecified: Secondary | ICD-10-CM | POA: Diagnosis not present

## 2018-02-27 ENCOUNTER — Other Ambulatory Visit: Payer: Self-pay | Admitting: Physician Assistant

## 2018-02-28 NOTE — Telephone Encounter (Signed)
Rx sent to pharmacy   

## 2018-03-23 DIAGNOSIS — G4733 Obstructive sleep apnea (adult) (pediatric): Secondary | ICD-10-CM | POA: Diagnosis not present

## 2018-03-23 DIAGNOSIS — J449 Chronic obstructive pulmonary disease, unspecified: Secondary | ICD-10-CM | POA: Diagnosis not present

## 2018-03-30 ENCOUNTER — Other Ambulatory Visit: Payer: Self-pay | Admitting: Cardiovascular Disease

## 2018-04-22 DIAGNOSIS — G4733 Obstructive sleep apnea (adult) (pediatric): Secondary | ICD-10-CM | POA: Diagnosis not present

## 2018-04-22 DIAGNOSIS — J449 Chronic obstructive pulmonary disease, unspecified: Secondary | ICD-10-CM | POA: Diagnosis not present

## 2018-05-17 ENCOUNTER — Other Ambulatory Visit: Payer: Self-pay | Admitting: Cardiovascular Disease

## 2018-05-17 NOTE — Telephone Encounter (Signed)
Rx request sent to pharmacy.  

## 2018-05-23 DIAGNOSIS — J449 Chronic obstructive pulmonary disease, unspecified: Secondary | ICD-10-CM | POA: Diagnosis not present

## 2018-05-23 DIAGNOSIS — G4733 Obstructive sleep apnea (adult) (pediatric): Secondary | ICD-10-CM | POA: Diagnosis not present

## 2018-06-06 ENCOUNTER — Ambulatory Visit: Payer: Self-pay | Admitting: Internal Medicine

## 2018-06-07 ENCOUNTER — Encounter: Payer: Self-pay | Admitting: Internal Medicine

## 2018-06-23 DIAGNOSIS — G4733 Obstructive sleep apnea (adult) (pediatric): Secondary | ICD-10-CM | POA: Diagnosis not present

## 2018-06-23 DIAGNOSIS — J449 Chronic obstructive pulmonary disease, unspecified: Secondary | ICD-10-CM | POA: Diagnosis not present

## 2018-07-23 DIAGNOSIS — G4733 Obstructive sleep apnea (adult) (pediatric): Secondary | ICD-10-CM | POA: Diagnosis not present

## 2018-07-23 DIAGNOSIS — J449 Chronic obstructive pulmonary disease, unspecified: Secondary | ICD-10-CM | POA: Diagnosis not present

## 2018-08-05 ENCOUNTER — Other Ambulatory Visit: Payer: Self-pay | Admitting: Cardiovascular Disease

## 2018-08-07 NOTE — Telephone Encounter (Signed)
Rx(s) sent to pharmacy electronically.  

## 2018-08-23 DIAGNOSIS — J449 Chronic obstructive pulmonary disease, unspecified: Secondary | ICD-10-CM | POA: Diagnosis not present

## 2018-08-23 DIAGNOSIS — G4733 Obstructive sleep apnea (adult) (pediatric): Secondary | ICD-10-CM | POA: Diagnosis not present

## 2018-08-29 DIAGNOSIS — J441 Chronic obstructive pulmonary disease with (acute) exacerbation: Secondary | ICD-10-CM | POA: Diagnosis not present

## 2018-09-01 ENCOUNTER — Other Ambulatory Visit: Payer: Self-pay | Admitting: Cardiovascular Disease

## 2018-09-18 ENCOUNTER — Other Ambulatory Visit: Payer: Self-pay | Admitting: Cardiology

## 2018-09-22 DIAGNOSIS — J449 Chronic obstructive pulmonary disease, unspecified: Secondary | ICD-10-CM | POA: Diagnosis not present

## 2018-09-22 DIAGNOSIS — G4733 Obstructive sleep apnea (adult) (pediatric): Secondary | ICD-10-CM | POA: Diagnosis not present

## 2018-10-23 DIAGNOSIS — J449 Chronic obstructive pulmonary disease, unspecified: Secondary | ICD-10-CM | POA: Diagnosis not present

## 2018-10-23 DIAGNOSIS — G4733 Obstructive sleep apnea (adult) (pediatric): Secondary | ICD-10-CM | POA: Diagnosis not present

## 2018-11-20 ENCOUNTER — Other Ambulatory Visit: Payer: Self-pay | Admitting: Cardiovascular Disease

## 2018-11-23 DIAGNOSIS — G4733 Obstructive sleep apnea (adult) (pediatric): Secondary | ICD-10-CM | POA: Diagnosis not present

## 2018-11-23 DIAGNOSIS — J449 Chronic obstructive pulmonary disease, unspecified: Secondary | ICD-10-CM | POA: Diagnosis not present

## 2018-12-13 ENCOUNTER — Other Ambulatory Visit: Payer: Self-pay | Admitting: Cardiovascular Disease

## 2018-12-22 DIAGNOSIS — G4733 Obstructive sleep apnea (adult) (pediatric): Secondary | ICD-10-CM | POA: Diagnosis not present

## 2018-12-22 DIAGNOSIS — J449 Chronic obstructive pulmonary disease, unspecified: Secondary | ICD-10-CM | POA: Diagnosis not present

## 2018-12-25 DIAGNOSIS — Z9989 Dependence on other enabling machines and devices: Secondary | ICD-10-CM | POA: Diagnosis not present

## 2018-12-25 DIAGNOSIS — Z6836 Body mass index (BMI) 36.0-36.9, adult: Secondary | ICD-10-CM | POA: Diagnosis not present

## 2018-12-25 DIAGNOSIS — J441 Chronic obstructive pulmonary disease with (acute) exacerbation: Secondary | ICD-10-CM | POA: Diagnosis not present

## 2018-12-25 DIAGNOSIS — R0600 Dyspnea, unspecified: Secondary | ICD-10-CM | POA: Diagnosis not present

## 2018-12-25 DIAGNOSIS — J9611 Chronic respiratory failure with hypoxia: Secondary | ICD-10-CM | POA: Diagnosis not present

## 2018-12-25 DIAGNOSIS — G4733 Obstructive sleep apnea (adult) (pediatric): Secondary | ICD-10-CM | POA: Diagnosis not present

## 2018-12-25 DIAGNOSIS — E6609 Other obesity due to excess calories: Secondary | ICD-10-CM | POA: Diagnosis not present

## 2018-12-26 DIAGNOSIS — G4733 Obstructive sleep apnea (adult) (pediatric): Secondary | ICD-10-CM | POA: Diagnosis not present

## 2019-01-01 ENCOUNTER — Other Ambulatory Visit: Payer: Self-pay | Admitting: Cardiovascular Disease

## 2019-01-22 DIAGNOSIS — J449 Chronic obstructive pulmonary disease, unspecified: Secondary | ICD-10-CM | POA: Diagnosis not present

## 2019-01-22 DIAGNOSIS — G4733 Obstructive sleep apnea (adult) (pediatric): Secondary | ICD-10-CM | POA: Diagnosis not present

## 2019-02-14 DIAGNOSIS — I10 Essential (primary) hypertension: Secondary | ICD-10-CM | POA: Diagnosis not present

## 2019-02-14 DIAGNOSIS — M1990 Unspecified osteoarthritis, unspecified site: Secondary | ICD-10-CM | POA: Diagnosis not present

## 2019-02-14 DIAGNOSIS — J449 Chronic obstructive pulmonary disease, unspecified: Secondary | ICD-10-CM | POA: Diagnosis not present

## 2019-02-14 DIAGNOSIS — E1159 Type 2 diabetes mellitus with other circulatory complications: Secondary | ICD-10-CM | POA: Diagnosis not present

## 2019-02-14 DIAGNOSIS — I25119 Atherosclerotic heart disease of native coronary artery with unspecified angina pectoris: Secondary | ICD-10-CM | POA: Diagnosis not present

## 2019-02-14 DIAGNOSIS — Z125 Encounter for screening for malignant neoplasm of prostate: Secondary | ICD-10-CM | POA: Diagnosis not present

## 2019-02-14 DIAGNOSIS — Z0000 Encounter for general adult medical examination without abnormal findings: Secondary | ICD-10-CM | POA: Diagnosis not present

## 2019-02-14 DIAGNOSIS — E785 Hyperlipidemia, unspecified: Secondary | ICD-10-CM | POA: Diagnosis not present

## 2019-02-19 DIAGNOSIS — I10 Essential (primary) hypertension: Secondary | ICD-10-CM | POA: Diagnosis not present

## 2019-02-19 DIAGNOSIS — E1159 Type 2 diabetes mellitus with other circulatory complications: Secondary | ICD-10-CM | POA: Diagnosis not present

## 2019-02-19 DIAGNOSIS — M1990 Unspecified osteoarthritis, unspecified site: Secondary | ICD-10-CM | POA: Diagnosis not present

## 2019-02-19 DIAGNOSIS — Z125 Encounter for screening for malignant neoplasm of prostate: Secondary | ICD-10-CM | POA: Diagnosis not present

## 2019-02-19 DIAGNOSIS — E785 Hyperlipidemia, unspecified: Secondary | ICD-10-CM | POA: Diagnosis not present

## 2019-02-21 DIAGNOSIS — G4733 Obstructive sleep apnea (adult) (pediatric): Secondary | ICD-10-CM | POA: Diagnosis not present

## 2019-02-21 DIAGNOSIS — J449 Chronic obstructive pulmonary disease, unspecified: Secondary | ICD-10-CM | POA: Diagnosis not present

## 2019-02-23 ENCOUNTER — Other Ambulatory Visit: Payer: Self-pay | Admitting: Cardiovascular Disease

## 2019-03-10 ENCOUNTER — Other Ambulatory Visit: Payer: Self-pay | Admitting: Cardiovascular Disease

## 2019-03-12 ENCOUNTER — Other Ambulatory Visit: Payer: Self-pay

## 2019-03-12 MED ORDER — ISOSORBIDE MONONITRATE ER 60 MG PO TB24: 60.0000 mg | ORAL_TABLET | Freq: Every day | ORAL | 0 refills | Status: DC

## 2019-03-12 NOTE — Telephone Encounter (Signed)
Rx(s) sent to pharmacy electronically.  

## 2019-03-24 DIAGNOSIS — G4733 Obstructive sleep apnea (adult) (pediatric): Secondary | ICD-10-CM | POA: Diagnosis not present

## 2019-03-24 DIAGNOSIS — J449 Chronic obstructive pulmonary disease, unspecified: Secondary | ICD-10-CM | POA: Diagnosis not present

## 2019-04-11 DIAGNOSIS — I25119 Atherosclerotic heart disease of native coronary artery with unspecified angina pectoris: Secondary | ICD-10-CM | POA: Diagnosis not present

## 2019-04-11 DIAGNOSIS — S2020XA Contusion of thorax, unspecified, initial encounter: Secondary | ICD-10-CM | POA: Diagnosis not present

## 2019-04-11 DIAGNOSIS — Z9181 History of falling: Secondary | ICD-10-CM | POA: Diagnosis not present

## 2019-04-20 ENCOUNTER — Other Ambulatory Visit: Payer: Self-pay | Admitting: Cardiovascular Disease

## 2019-04-23 DIAGNOSIS — G4733 Obstructive sleep apnea (adult) (pediatric): Secondary | ICD-10-CM | POA: Diagnosis not present

## 2019-04-23 DIAGNOSIS — J449 Chronic obstructive pulmonary disease, unspecified: Secondary | ICD-10-CM | POA: Diagnosis not present

## 2019-05-01 DIAGNOSIS — I739 Peripheral vascular disease, unspecified: Secondary | ICD-10-CM | POA: Diagnosis not present

## 2019-05-01 DIAGNOSIS — M170 Bilateral primary osteoarthritis of knee: Secondary | ICD-10-CM | POA: Diagnosis not present

## 2019-05-01 DIAGNOSIS — E669 Obesity, unspecified: Secondary | ICD-10-CM | POA: Diagnosis not present

## 2019-05-01 DIAGNOSIS — G894 Chronic pain syndrome: Secondary | ICD-10-CM | POA: Diagnosis not present

## 2019-05-01 DIAGNOSIS — N183 Chronic kidney disease, stage 3 (moderate): Secondary | ICD-10-CM | POA: Diagnosis not present

## 2019-05-15 DIAGNOSIS — G4733 Obstructive sleep apnea (adult) (pediatric): Secondary | ICD-10-CM | POA: Diagnosis not present

## 2019-05-19 ENCOUNTER — Other Ambulatory Visit: Payer: Self-pay | Admitting: Cardiovascular Disease

## 2019-05-24 DIAGNOSIS — J449 Chronic obstructive pulmonary disease, unspecified: Secondary | ICD-10-CM | POA: Diagnosis not present

## 2019-05-24 DIAGNOSIS — G4733 Obstructive sleep apnea (adult) (pediatric): Secondary | ICD-10-CM | POA: Diagnosis not present

## 2019-06-01 DIAGNOSIS — Z8739 Personal history of other diseases of the musculoskeletal system and connective tissue: Secondary | ICD-10-CM | POA: Diagnosis not present

## 2019-06-12 DIAGNOSIS — M170 Bilateral primary osteoarthritis of knee: Secondary | ICD-10-CM | POA: Diagnosis not present

## 2019-06-12 DIAGNOSIS — G894 Chronic pain syndrome: Secondary | ICD-10-CM | POA: Diagnosis not present

## 2019-06-12 DIAGNOSIS — N183 Chronic kidney disease, stage 3 (moderate): Secondary | ICD-10-CM | POA: Diagnosis not present

## 2019-06-24 DIAGNOSIS — G4733 Obstructive sleep apnea (adult) (pediatric): Secondary | ICD-10-CM | POA: Diagnosis not present

## 2019-06-24 DIAGNOSIS — J449 Chronic obstructive pulmonary disease, unspecified: Secondary | ICD-10-CM | POA: Diagnosis not present

## 2019-07-20 ENCOUNTER — Other Ambulatory Visit: Payer: Self-pay | Admitting: Cardiovascular Disease

## 2019-07-20 ENCOUNTER — Telehealth: Payer: Self-pay | Admitting: Cardiovascular Disease

## 2019-07-20 NOTE — Telephone Encounter (Signed)
°*  STAT* If patient is at the pharmacy, call can be transferred to refill team.   1. Which medications need to be refilled? (please list name of each medication and dose if known) isosorbide mononitrate (IMDUR) 60 MG 24 hr tablet   2. Which pharmacy/location (including street and city if local pharmacy) is medication to be sent to? [place], [place]  3. Do they need a 30 day or 90 day supply? 90 day

## 2019-07-23 ENCOUNTER — Other Ambulatory Visit: Payer: Self-pay

## 2019-07-23 MED ORDER — ISOSORBIDE MONONITRATE ER 60 MG PO TB24: 60.0000 mg | ORAL_TABLET | Freq: Every day | ORAL | 0 refills | Status: DC

## 2019-07-24 DIAGNOSIS — J449 Chronic obstructive pulmonary disease, unspecified: Secondary | ICD-10-CM | POA: Diagnosis not present

## 2019-07-24 DIAGNOSIS — G4733 Obstructive sleep apnea (adult) (pediatric): Secondary | ICD-10-CM | POA: Diagnosis not present

## 2019-08-09 ENCOUNTER — Telehealth: Payer: Self-pay | Admitting: Cardiovascular Disease

## 2019-08-09 NOTE — Telephone Encounter (Signed)
New Message     Pts wife is calling and is would like for a nurse to call her. She is wondering  Where he got his cpap equipment. His mask is falling apart and they would like to know how to get another one. She also said she would like all of his medications to be refilled until his next appt    Please call

## 2019-08-10 DIAGNOSIS — G4733 Obstructive sleep apnea (adult) (pediatric): Secondary | ICD-10-CM | POA: Diagnosis not present

## 2019-08-20 ENCOUNTER — Other Ambulatory Visit: Payer: Self-pay | Admitting: Cardiovascular Disease

## 2019-08-20 DIAGNOSIS — Z125 Encounter for screening for malignant neoplasm of prostate: Secondary | ICD-10-CM | POA: Diagnosis not present

## 2019-08-20 DIAGNOSIS — E1159 Type 2 diabetes mellitus with other circulatory complications: Secondary | ICD-10-CM | POA: Diagnosis not present

## 2019-08-20 DIAGNOSIS — F0150 Vascular dementia without behavioral disturbance: Secondary | ICD-10-CM | POA: Diagnosis not present

## 2019-08-20 DIAGNOSIS — I10 Essential (primary) hypertension: Secondary | ICD-10-CM | POA: Diagnosis not present

## 2019-08-20 DIAGNOSIS — Z23 Encounter for immunization: Secondary | ICD-10-CM | POA: Diagnosis not present

## 2019-08-20 DIAGNOSIS — G309 Alzheimer's disease, unspecified: Secondary | ICD-10-CM | POA: Diagnosis not present

## 2019-08-20 DIAGNOSIS — E785 Hyperlipidemia, unspecified: Secondary | ICD-10-CM | POA: Diagnosis not present

## 2019-08-20 DIAGNOSIS — E559 Vitamin D deficiency, unspecified: Secondary | ICD-10-CM | POA: Diagnosis not present

## 2019-08-20 DIAGNOSIS — M1A9XX0 Chronic gout, unspecified, without tophus (tophi): Secondary | ICD-10-CM | POA: Diagnosis not present

## 2019-08-20 DIAGNOSIS — F0280 Dementia in other diseases classified elsewhere without behavioral disturbance: Secondary | ICD-10-CM | POA: Diagnosis not present

## 2019-08-20 NOTE — Telephone Encounter (Signed)
New Message   *STAT* If patient is at the pharmacy, call can be transferred to refill team.   1. Which medications need to be refilled? (please list name of each medication and dose if known) isosorbide mononitrate 60 mg  2. Which pharmacy/location (including street and city if local pharmacy) is medication to be sent to? [place]  3. Do they need a 30 day or 90 day supply? 30 day

## 2019-08-21 MED ORDER — ISOSORBIDE MONONITRATE ER 60 MG PO TB24: 60.0000 mg | ORAL_TABLET | Freq: Every day | ORAL | 0 refills | Status: AC

## 2019-08-24 DIAGNOSIS — J449 Chronic obstructive pulmonary disease, unspecified: Secondary | ICD-10-CM | POA: Diagnosis not present

## 2019-08-24 DIAGNOSIS — G4733 Obstructive sleep apnea (adult) (pediatric): Secondary | ICD-10-CM | POA: Diagnosis not present

## 2019-08-29 ENCOUNTER — Other Ambulatory Visit: Payer: Self-pay | Admitting: Cardiovascular Disease

## 2019-09-18 ENCOUNTER — Encounter: Payer: Self-pay | Admitting: Cardiovascular Disease

## 2019-09-18 ENCOUNTER — Ambulatory Visit (INDEPENDENT_AMBULATORY_CARE_PROVIDER_SITE_OTHER): Payer: PPO | Admitting: Cardiovascular Disease

## 2019-09-18 ENCOUNTER — Other Ambulatory Visit: Payer: Self-pay

## 2019-09-18 VITALS — BP 142/78 | HR 70 | Temp 98.2°F | Ht 66.0 in | Wt 240.0 lb

## 2019-09-18 DIAGNOSIS — Z951 Presence of aortocoronary bypass graft: Secondary | ICD-10-CM | POA: Diagnosis not present

## 2019-09-18 DIAGNOSIS — G4733 Obstructive sleep apnea (adult) (pediatric): Secondary | ICD-10-CM

## 2019-09-18 DIAGNOSIS — R0602 Shortness of breath: Secondary | ICD-10-CM

## 2019-09-18 DIAGNOSIS — I10 Essential (primary) hypertension: Secondary | ICD-10-CM

## 2019-09-18 DIAGNOSIS — E785 Hyperlipidemia, unspecified: Secondary | ICD-10-CM | POA: Diagnosis not present

## 2019-09-18 DIAGNOSIS — I2510 Atherosclerotic heart disease of native coronary artery without angina pectoris: Secondary | ICD-10-CM | POA: Diagnosis not present

## 2019-09-18 DIAGNOSIS — R600 Localized edema: Secondary | ICD-10-CM | POA: Diagnosis not present

## 2019-09-18 MED ORDER — ROSUVASTATIN CALCIUM 20 MG PO TABS: 20.0000 mg | ORAL_TABLET | Freq: Every day | ORAL | 3 refills | Status: DC

## 2019-09-18 MED ORDER — SPIRONOLACTONE 25 MG PO TABS: 12.5000 mg | ORAL_TABLET | Freq: Every day | ORAL | 3 refills | Status: DC

## 2019-09-18 NOTE — Progress Notes (Signed)
Patient ID: Daniel Mays, male   DOB: 31-Mar-1941, 78 y.o.   MRN: [id number]     HPI: Daniel Mays is a 78 y.o. male who presents to the office today for a 17 month follow up evaluation.  Daniel [facility] has known CAD and in June 2007 suffered an inferior wall Daniel segment elevation myocardial infarction which was complicated by recurrent episodes of ventricular fibrillation requiring numerous to defibrillations, CPR, intra-aortic balloon pump insertion and pacemaker therapy. At that time, I performed successful PTCA of a otally occluded right coronary artery with restoration of TIMI-3 flow. Due to severe concomitant CAD I recommended elective CABG surgery which was done the following day by Daniel [facility]ays. He underwent CABG x5 with a LIMA to the LAD, a vein to the diagonal, vein to the intermediate, sequential vein to the PDA and PLA branch of the right coronary artery. In June 2009 he underwent 2 vessel intervention involving the LAD ostium and proximal intermediate vessel. In April 2014  a myocardial perfusion study was low risk without significant scar or ischemia in only mild inferolateral thinning. Ejection fraction 61%. He had normal wall motion. An echo Doppler study on 02/27/2014, which showed an ejection fraction of 45-50% without wall motion abnormality.  Estimated pulmonary pressure was normal.  He has a history of hyperlipidemia, obesity, and mild renal artery stenosis on duplex imaging.  He has a history of complex sleep apnea, and had not been on CPAP therapy for several years. He  was referred for a follow-up sleep study on 07/20/2013. This was done in a split-night protocol due to severe sleep apnea. His overall AHI was 65.7 per hour. He was unable to reach REM sleep in the baseline portion of the study. Oxygen dropped to 86% during non-REM sleep and has evidence for loud snoring. He was titrated to 13 cm water pressure but due to development of significant central events, BiPAP was  started at 14/10 was increased to 15/11 cm. He received an AirSense 10 ResMed unit on 10/26/2013.  When I last saw him, he was having difficulty in significant changes were made teamed.  I reduced his EPAP min to  8 but he had the potential to increase to 18/14 if necessary.  We also changed him to a heated coil hose for improved humidification.  Subsequently, he has been sleeping significantly better.  He denies residual daytime sleepiness.  He is unaware of breakthrough snoring.  Presently he only minutes to infrequent use.  [facility] was [facility]2014 and was discharged 2 days later.  He presented with dysarthria, left face weakness, and confusion.  A CT of his head was negative for acute stroke.  [facility] was consulted.  An MRI of his brain was also negative for acute stroke but showed remote right MCA territory infarct.  A subsequent echo showed an EF of 60-65%.  Carotid Dopplers revealed a 40-59% internal carotid stenosis on the right and left 1 a 39% with antegrade flow.  Lipid studies showed an LDL cholesterol of 54 total cholesterol 120, triglycerides were elevated at 184 and HDL was low at 29.    He was seen in the office in April 2018 by [facility] with shortness of breath.  The patient had also seen Daniel Mays in [place] and was treated with a course of antibiotic and steroid therapy.  He responded to steroid therapy.  When he saw [facility] follow-up.  He was given a short course of increased diuretic regimen for  several days but was uncertain if his shortness of breath was pulmonary or cardiac etiology.  Subsequent, he has felt improved.  He denies any recent wheezing.  He denies fevers, chills or night sweats.  He denies significant leg swelling.  He admits to 100% compliance with his CPAP therapy for his obstructive sleep apnea.  He tells me he was started on supplemental oxygen at night, which is black into his CPAP machine.   I saw him in March 2019, he had noticed increasing  ankle edema.  He also has had difficulty moving his arm.  He does not exercise.  He admits to increasing shortness of breath with minimal activity.  He is not walking.  He sees Daniel Mays in [place].  He underwent a stress test on December 27, 2017.  EF was 50%.  There were no Daniel segment changes.  He had normal perfusion.  An echo Doppler study on January 04, 2018 showed an EF at 50 to 55% without wall motion abnormalities.  There was mild LVH.  Mild diastolic parameters.  There was no significant valvular pathology.  Recent laboratory a BNP of 93.  Normal renal function and LFTs.  Hemoglobin and hematocrit were stable.  Lipids were notable for elevated triglycerides at 183 and low HDL at 34.  Total cholesterol was 135 and LDL was 64.  TSH was normal.  He use BiPAP therapy with 100% compliance.    Since I last saw him in May 2019, Daniel [facility]ays has had issues with lower extremity edema.  Has been on aspirin/Plavix, furosemide 40 mg daily, isosorbide 60 mg, metoprolol 50 mg twice a day, and ran out of spironolactone.  He has been on simvastatin 20 mg.  He has not been using BiPAP with compliance.  A download was obtained in the office today from November 6 through September 08, 2019 which shows suboptimal compliance with only 23% of usage days and average usage 1 hour and 59 minutes per night.   His AHI was 9.8 with BiPAP auto with a minimum EPAP pressure of 8 and maximum IPAP pressure of 20 cm water pressure.  He was recently found to be vitamin D insufficient and was started on 50,000 units of vitamin D.  He states his recent hemoglobin A1c was 7.2.  He presents for evaluation.  Past Medical History:  Diagnosis Date  . Arthritis    knees  . CAD (coronary artery disease)    2D ECHO, 03/17/2010 - EF 45-50%, normal  . Chronic kidney disease    blockage in renal artery  . Diabetes mellitus without complication (HCC)    diet controlled  . GERD (gastroesophageal reflux disease)   . Hard of hearing   . Heart  attack ([place]) 2007  . Hyperlipidemia   . Hypertension   . OSA (obstructive sleep apnea)    CPAP  . Wears dentures    full upper and lower    Past Surgical History:  Procedure Laterality Date  . CARDIAC CATHETERIZATION  04/01/2008   LAD ostium stented with a 2.5x67m Promus DES stent reducing a 95% stenosis to 0%; Proximal intermediate Ramus stented with a 2.75x158mPromus drug-eluting stent reducing a 85% stenosis to 0% residual  . CARDIAC CATHETERIZATION  03/26/2008   Increased medical management  . CARDIAC CATHETERIZATION  04/27/2006   CABG recommended  . CATARACT EXTRACTION W/PHACO Left 04/16/2015   Procedure: CATARACT EXTRACTION PHACO AND INTRAOCULAR LENS PLACEMENT (IOC);  Surgeon: ChLeandrew KoyanagiMD;  [facility]  Service: Ophthalmology;  Laterality: Left;  CPAP DIABETIC  . CORONARY ANGIOPLASTY WITH STENT PLACEMENT  2010  . CORONARY ARTERY BYPASS GRAFT  04/29/2006   LIMA to LAD, SVG to first diagonal, SVG to ramus intermedius, SVG to PDA, and SVG to PLA branch of RCA  . EXERCISE STRESS TEST  01/18/2013   Small fixed inferolateral defect-artifact is favored. No ischemia  . EYE SURGERY  1987  . TEE WITHOUT CARDIOVERSION  04/29/2006    No Known Allergies  Current Outpatient Medications  Medication Sig Dispense Refill  . acetaminophen (TYLENOL) 500 MG tablet Take 500 mg by mouth 3 (three) times daily.    Marland Kitchen albuterol (PROAIR HFA) 108 (90 Base) MCG/ACT inhaler Inhale 90 mcg into the lungs.    Marland Kitchen albuterol (PROVENTIL) (2.5 MG/3ML) 0.083% nebulizer solution Take 3 mLs (2.5 mg total) by nebulization every 6 (six) hours as needed for wheezing or shortness of breath. DX: COPD DX Code: J44.9 75 mL 12  . aspirin EC 81 MG tablet Take 81 mg by mouth daily. AM    . budesonide-formoterol (SYMBICORT) 160-4.5 MCG/ACT inhaler Inhale 2 puffs into the lungs 2 (two) times daily. 1 Inhaler 0  . clopidogrel (PLAVIX) 75 MG tablet TAKE 1 TABLET BY MOUTH ONCE DAILY (NEEDS  OFFICE   VISIT) 90 tablet 0  . donepezil (ARICEPT) 5 MG tablet Take 5 mg by mouth daily.    . ergocalciferol (VITAMIN D2) 1.25 MG (50000 UT) capsule Take by mouth.    . furosemide (LASIX) 40 MG tablet TAKE 1 TABLET BY MOUTH ONCE DAILY (NEEDS  OFFICE  VISIT) 90 tablet 0  . isosorbide mononitrate (IMDUR) 60 MG 24 hr tablet Take 1 tablet (60 mg total) by mouth daily. Please make annual appt with Dr. Claiborne Billings for future refills. [phone]. 1st attempt. 30 tablet 0  . metoprolol tartrate (LOPRESSOR) 50 MG tablet TAKE 1 TABLET BY MOUTH TWICE DAILY (NEEDS  OFFICE  VISIT) 180 tablet 0  . pantoprazole (PROTONIX) 40 MG tablet TAKE ONE TABLET BY MOUTH ONCE DAILY 90 tablet 1  . potassium chloride SA (K-DUR) 20 MEQ tablet Take 1 tablet by mouth once daily 90 tablet 1  . spironolactone (ALDACTONE) 25 MG tablet Take 0.5 tablets (12.5 mg total) by mouth daily. 45 tablet 3  . rosuvastatin (CRESTOR) 20 MG tablet Take 1 tablet (20 mg total) by mouth daily. 90 tablet 3   No current facility-administered medications for this visit.    Social History   Socioeconomic History  . Marital status: Married    Spouse name: Not on file  . Number of children: Not on file  . Years of education: Not on file  . Highest education level: Not on file  Occupational History  . Occupation: Retired Nurse, learning disability  Tobacco Use  . Smoking status: Former Smoker    Packs/day: 5.00    Years: 55.00    Pack years: 275.00    Types: Cigarettes    Start date: 10/04/1946    Quit date: 10/04/2001    Years since quitting: 17.9  . Smokeless tobacco: Never Used  Substance and Sexual Activity  . Alcohol use: Yes    Alcohol/week: 3.0 standard drinks    Types: 3 Shots of liquor per week    Comment: occ  . Drug use: No  . Sexual activity: Not on file  Other Topics Concern  . Not on file  Social History Narrative  . Not on file   Social Determinants of Health   Financial Resource  Strain:   . Difficulty of Paying Living Expenses: Not  on file  Food Insecurity:   . Worried About Charity fundraiser in the Last Year: Not on file  . Ran Out of Food in the Last Year: Not on file  Transportation Needs:   . Lack of Transportation (Medical): Not on file  . Lack of Transportation (Non-Medical): Not on file  Physical Activity:   . Days of Exercise per Week: Not on file  . Minutes of Exercise per Session: Not on file  Stress:   . Feeling of Stress : Not on file  Social Connections:   . Frequency of Communication with Friends and Family: Not on file  . Frequency of Social Gatherings with Friends and Family: Not on file  . Attends Religious Services: Not on file  . Active Member of Clubs or Organizations: Not on file  . Attends Archivist Meetings: Not on file  . Marital Status: Not on file  Intimate Partner Violence:   . Fear of Current or Ex-Partner: Not on file  . Emotionally Abused: Not on file  . Physically Abused: Not on file  . Sexually Abused: Not on file    Family History  Problem Relation Age of Onset  . Parkinson's disease Mother   . Diabetes Mother     ROS General: Negative; No fevers, chills, or night sweats HEENT: Negative; No changes in vision or hearing, sinus congestion, difficulty swallowing Pulmonary: Previous shortness of breath which improved with antibiotics and steroid taper Cardiovascular: See HPI: No chest pain, presyncope, syncope, palpatations Positive for leg edema, which has improved, but still is present GI: Positive for GERD No nausea, vomiting, diarrhea, or abdominal pain GU: Negative; No dysuria, hematuria, or difficulty voiding Musculoskeletal:  joint stiffness. Hematologic: Negative; no easy bruising, bleeding Endocrine: Negative; no heat/cold intolerance; no diabetes, Neuro: see history of present illness Skin: Negative; No rashes or skin lesions Psychiatric: Negative; No behavioral problems, depression Sleep: Positive for complex sleep apnea with current BiPAP  settings no snoring,  daytime sleepiness, hypersomnolence, bruxism, restless legs, hypnogognic hallucinations. Other comprehensive 14 point system review is negative   Physical Exam BP (!) 142/78   Pulse 70   Temp 98.2 F (36.8 C)   Ht 5' 6" (1.676 m)   Wt 240 lb (108.9 kg)   SpO2 92%   BMI 38.74 kg/m    Repeat blood pressure by me was 135/78  Wt Readings from Last 3 Encounters:  09/18/19 240 lb (108.9 kg)  02/17/18 231 lb 6.4 oz (105 kg)  12/27/17 237 lb (107.5 kg)   General: Alert, oriented, no distress.  Skin: normal turgor, no rashes, warm and dry HEENT: Normocephalic, atraumatic. Pupils equal round and reactive to light; sclera anicteric; extraocular muscles intact;  Nose without nasal septal hypertrophy Mouth/Parynx benign; Mallinpatti scale 3 Neck: No JVD, no carotid bruits; normal carotid upstroke Lungs: clear to ausculatation and percussion; no wheezing or rales Chest wall: without tenderness to palpitation Heart: PMI not displaced, RRR, s1 s2 normal, 1/6 systolic murmur, no diastolic murmur, no rubs, gallops, thrills, or heaves Abdomen: soft, nontender; no hepatosplenomehaly, BS+; abdominal aorta nontender and not dilated by palpation. Back: no CVA tenderness Pulses 2+ Musculoskeletal: full range of motion, normal strength, no joint deformities Extremities: Trace bilateral ankle/LE swelling;  no clubbing cyanosis or edema, Homan's sign negative  Neurologic: grossly nonfocal; Cranial nerves grossly wnl Psychologic: Normal mood and affect   ECG (independently read by me): NSR at 70; first  degree AV block PR 218 msec; no ectopy;QTc 440 msec  May 2019 ECG (independently read by me): Sinus rhythm at 72 bpm.  First-degree AV block with a PR interval of 234 ms, specific T changes.  March 2019 ECG (independently read by me): normal sinus rhythm at 75 bpm.  Nonspecific Daniel changes.  Normal intervals.  No ectopy.  July 2018 ECG (independently read by me): Sinus  bradycardia at 52 bpm.  First-degree AV block with a PR interval of 224 ms.  QTc interval normal.  No significant Daniel-T changes  March 2017 ECG (independently read by me):  Sinus rhythm with an occasional PVC. Nonspecific Daniel changes. Borderline first-degree AV block  Novembr 2015 ECG (independently read by me): Normal sinus rhythm with first degree AV block with a PR interval at 210 ms.  No significant Daniel segment changes.  June 2015 ECG (independently read by me): Normal sinus rhythm at 68 beats per minute.  Mild first degree AV block with PR interval 208 ms.  Nonspecific Daniel-T change  LABS:  BMP Latest Ref Rng & Units 12/27/2017 01/26/2017 01/12/2017  Glucose 65 - 99 mg/dL 95 107(H) 111(H)  BUN 8 - 27 mg/dL _0 Creatinine 0.76 - 1.27 mg/dL 1.07 1.47(H) 1.46(H)  BUN/Creat Ratio 10 - 24 15 - -  Sodium 134 - 144 mmol/L 145(H) 142 144  Potassium 3.5 - 5.2 mmol/L 4.6 4.2 4.7  Chloride 96 - 106 mmol/L 104 103 106  CO2 20 - 29 mmol/L _1 Calcium 8.6 - 10.2 mg/dL 9.2 9.8 8.8   Hepatic Function Latest Ref Rng & Units 12/27/2017 07/28/2014 04/17/2014  Total Protein 6.0 - 8.5 g/dL 7.0 7.7 7.0  Albumin 3.5 - 4.8 g/dL 4.5 4.1 4.5  AST 0 - 40 IU/L _2 ALT 0 - 44 IU/L _3 Alk Phosphatase 39 - 117 IU/L 35(L) 44 47  Total Bilirubin 0.0 - 1.2 mg/dL 0.4 0.3 0.4   CBC Latest Ref Rng & Units 12/27/2017 07/28/2014 04/17/2014  WBC 3.4 - 10.8 x10E3/uL 7.3 8.6 7.3  Hemoglobin 13.0 - 17.7 g/dL 14.2 13.7 14.5  Hematocrit 37.5 - 51.0 % 41.8 41.5 42.7  Platelets 150 - 379 x10E3/uL 316 278 282   Lab Results  Component Value Date   MCV 90 12/27/2017   MCV 89.1 07/28/2014   MCV 85.6 04/17/2014    Lab Results  Component Value Date   TSH 1.370 12/27/2017   Lab Results  Component Value Date   HGBA1C 6.4 (H) 07/28/2014   Lipid Panel     Component Value Date/Time   CHOL 135 12/27/2017 1106   TRIG 183 (H) 12/27/2017 1106   HDL 34 (L) 12/27/2017 1106   CHOLHDL 4.0 12/27/2017 1106    CHOLHDL 4.1 07/29/2014 0047   VLDL 37 07/29/2014 0047   LDLCALC 64 12/27/2017 1106   RADIOLOGY: No results found.  IMPRESSION: 1. Coronary artery disease involving native coronary artery of native heart without angina pectoris   2. Hx of CABG   3. Hyperlipidemia with target LDL less than 70   4. Essential hypertension   5. SOB (shortness of breath)   6. OSA (obstructive sleep apnea) on BiPAP   7. Leg edema     ASSESSMENT AND PLAN: Mr. Shipley is an 78 year-old gentleman who is status post remote MI with cardiac arrest, requiring numerous defibrillations for ventricular fibrillation, CPR, IABP insertion,and  pacemaker therapy in 2007.  He is status post CABG  revascularization surgery following successful PTCA to his totally occluded RCA with resumption of TIMI-3 flow for myocardial salvage prior to his current CABG surgery. Remotely, he had developed significant peripheral edema on amlodipine leading to its discontinuance. An echo Doppler study in 2015 showed an EF of 45-50% without focal wall motion abnormality and pulmonary pressures were normal.  In 2019 due to increasing shortness of breath he underwent a follow-up echo and nuclear study.  His nuclear study continued to show normal perfusion and he did not have ECG changes.  His echo Doppler study revealed normal EF of motion abnormalities or significant valvular pathology.  The past I had added spironolactone which was helpful for his shortness of breath and leg swelling.  He has run out of spironolactone and has noticed some recurrence of edema.  I have recommended resumption of spironolactone 12.5 mg daily.  I reviewed his BiPAP for his complex sleep apnea.  He is not compliant.  He typically has been going to bed between 8 and 9 PM and wakes up at 8 AM but is only had approximately 2 hours of use when he had therapy.  An Epworth Sleepiness Scale score today was significantly elevated at 20 consistent with excessive daytime sleepiness.  I am  changing his BiPAP settings and will increase his EPAP minimum to 10 and we will continue at present with an IPAP max of 18.  He will have a download up in 1 month for follow-up evaluation.  He has hyperlipidemia and in May 2020 total cholesterol was 160, triglycerides 236, HDL 33, and LDL 80.  With his CAD, I am recommending changing him to more aggressive rosuvastatin 20 mg in place of simvastatin.  In 3 months he will undergo follow-up lipid panel and chemistry profile.  In 6 months I have  recommended a 2-year follow-up echo Doppler study.  I will see him in 6 months after his echo for follow-up evaluation.  Time spent: 25 minutes Troy Sine, MD, [facility]  09/24/2019 3:32 PM

## 2019-09-18 NOTE — Patient Instructions (Addendum)
Medication Instructions:  STOP SIMVASTATIN START Crestor 20mg  take 1 tablet once a day  *If you need a refill on your cardiac medications before your next appointment, please call your pharmacy*  Lab Work: Your physician recommends that you return for lab work in: 2-3 months [place] ( [place]) If you have labs (blood work) drawn today and your tests are completely normal, you will receive your results only by: Marland Kitchen MyChart Message (if you have MyChart) OR . A paper copy in the mail If you have any lab test that is abnormal or we need to change your treatment, we will call you to review the results.  Testing/Procedures: Your physician has requested that you have an echocardiogram. Echocardiography is a painless test that uses sound waves to create images of your heart. It provides your doctor with information about the size and shape of your heart and how well your heart's chambers and valves are working. This procedure takes approximately one hour. There are no restrictions for this procedure. SCHEDULE TEST IN 5-6 MONTHS  TEST WILL BE COMPLETED AT [address]  Follow-Up: At [facility], you and your health needs are our priority.  As part of our continuing mission to provide you with exceptional heart care, we have created designated Provider Care Teams.  These Care Teams include your primary Cardiologist (physician) and Advanced Practice Providers (APPs -  Physician Assistants and Nurse Practitioners) who all work together to provide you with the care you need, when you need it.  Your next appointment:   6 month(s)  The format for your next appointment:   In Person  Provider:   Shelva Majestic, MD  Other Instructions

## 2019-09-23 DIAGNOSIS — G4733 Obstructive sleep apnea (adult) (pediatric): Secondary | ICD-10-CM | POA: Diagnosis not present

## 2019-09-23 DIAGNOSIS — J449 Chronic obstructive pulmonary disease, unspecified: Secondary | ICD-10-CM | POA: Diagnosis not present

## 2019-09-24 ENCOUNTER — Encounter: Payer: Self-pay | Admitting: Cardiovascular Disease

## 2019-10-15 ENCOUNTER — Other Ambulatory Visit: Payer: Self-pay | Admitting: Cardiovascular Disease

## 2019-10-24 DIAGNOSIS — G4733 Obstructive sleep apnea (adult) (pediatric): Secondary | ICD-10-CM | POA: Diagnosis not present

## 2019-10-24 DIAGNOSIS — J449 Chronic obstructive pulmonary disease, unspecified: Secondary | ICD-10-CM | POA: Diagnosis not present

## 2019-10-29 DIAGNOSIS — F0280 Dementia in other diseases classified elsewhere without behavioral disturbance: Secondary | ICD-10-CM | POA: Diagnosis not present

## 2019-10-29 DIAGNOSIS — R2689 Other abnormalities of gait and mobility: Secondary | ICD-10-CM | POA: Diagnosis not present

## 2019-10-29 DIAGNOSIS — R413 Other amnesia: Secondary | ICD-10-CM | POA: Diagnosis not present

## 2019-10-29 DIAGNOSIS — G309 Alzheimer's disease, unspecified: Secondary | ICD-10-CM | POA: Diagnosis not present

## 2019-10-29 DIAGNOSIS — Z79899 Other long term (current) drug therapy: Secondary | ICD-10-CM | POA: Diagnosis not present

## 2019-10-29 DIAGNOSIS — F0150 Vascular dementia without behavioral disturbance: Secondary | ICD-10-CM | POA: Diagnosis not present

## 2019-10-29 DIAGNOSIS — H9193 Unspecified hearing loss, bilateral: Secondary | ICD-10-CM | POA: Diagnosis not present

## 2019-10-30 ENCOUNTER — Other Ambulatory Visit: Payer: Self-pay | Admitting: Neurology

## 2019-10-30 ENCOUNTER — Other Ambulatory Visit (HOSPITAL_COMMUNITY): Payer: Self-pay | Admitting: Neurology

## 2019-10-30 DIAGNOSIS — R413 Other amnesia: Secondary | ICD-10-CM

## 2019-11-02 DIAGNOSIS — H903 Sensorineural hearing loss, bilateral: Secondary | ICD-10-CM | POA: Diagnosis not present

## 2019-11-09 DIAGNOSIS — G4733 Obstructive sleep apnea (adult) (pediatric): Secondary | ICD-10-CM | POA: Diagnosis not present

## 2019-11-09 DIAGNOSIS — J449 Chronic obstructive pulmonary disease, unspecified: Secondary | ICD-10-CM | POA: Diagnosis not present

## 2019-11-14 ENCOUNTER — Other Ambulatory Visit: Payer: Self-pay

## 2019-11-14 ENCOUNTER — Ambulatory Visit (HOSPITAL_COMMUNITY)
Admission: RE | Admit: 2019-11-14 | Discharge: 2019-11-14 | Disposition: A | Discharge status: Home / Self Care | Payer: PPO | Source: Ambulatory Visit | Attending: Neurology | Admitting: Neurology

## 2019-11-14 DIAGNOSIS — R930 Abnormal findings on diagnostic imaging of skull and head, not elsewhere classified: Secondary | ICD-10-CM | POA: Diagnosis not present

## 2019-11-14 DIAGNOSIS — R413 Other amnesia: Secondary | ICD-10-CM | POA: Insufficient documentation

## 2019-11-21 ENCOUNTER — Other Ambulatory Visit: Payer: Self-pay | Admitting: Cardiovascular Disease

## 2019-11-24 DIAGNOSIS — J449 Chronic obstructive pulmonary disease, unspecified: Secondary | ICD-10-CM | POA: Diagnosis not present

## 2019-11-24 DIAGNOSIS — G4733 Obstructive sleep apnea (adult) (pediatric): Secondary | ICD-10-CM | POA: Diagnosis not present

## 2019-12-04 DIAGNOSIS — E559 Vitamin D deficiency, unspecified: Secondary | ICD-10-CM | POA: Diagnosis not present

## 2019-12-25 DIAGNOSIS — E119 Type 2 diabetes mellitus without complications: Secondary | ICD-10-CM | POA: Diagnosis not present

## 2019-12-27 ENCOUNTER — Telehealth: Payer: Self-pay | Admitting: Cardiovascular Disease

## 2019-12-27 NOTE — Telephone Encounter (Signed)
Nurse calling in regards to pt's cpap machine. Call transferred to sleep coordinator.

## 2020-02-13 DIAGNOSIS — I10 Essential (primary) hypertension: Secondary | ICD-10-CM | POA: Diagnosis not present

## 2020-02-13 DIAGNOSIS — E1159 Type 2 diabetes mellitus with other circulatory complications: Secondary | ICD-10-CM | POA: Diagnosis not present

## 2020-02-13 DIAGNOSIS — E785 Hyperlipidemia, unspecified: Secondary | ICD-10-CM | POA: Diagnosis not present

## 2020-02-13 DIAGNOSIS — Z125 Encounter for screening for malignant neoplasm of prostate: Secondary | ICD-10-CM | POA: Diagnosis not present

## 2020-02-15 ENCOUNTER — Other Ambulatory Visit: Payer: Self-pay

## 2020-02-15 ENCOUNTER — Ambulatory Visit (HOSPITAL_COMMUNITY): Payer: PPO | Attending: Cardiovascular Disease

## 2020-02-15 DIAGNOSIS — E785 Hyperlipidemia, unspecified: Secondary | ICD-10-CM | POA: Insufficient documentation

## 2020-02-15 DIAGNOSIS — I10 Essential (primary) hypertension: Secondary | ICD-10-CM | POA: Diagnosis not present

## 2020-02-15 DIAGNOSIS — R0602 Shortness of breath: Secondary | ICD-10-CM | POA: Insufficient documentation

## 2020-02-15 DIAGNOSIS — I2510 Atherosclerotic heart disease of native coronary artery without angina pectoris: Secondary | ICD-10-CM | POA: Insufficient documentation

## 2020-02-15 DIAGNOSIS — Z951 Presence of aortocoronary bypass graft: Secondary | ICD-10-CM | POA: Insufficient documentation

## 2020-02-15 MED ORDER — PERFLUTREN LIPID MICROSPHERE
1.0000 mL | INTRAVENOUS | Status: AC | PRN
  Administered 2020-02-15: 2 mL via INTRAVENOUS

## 2020-02-19 DIAGNOSIS — G894 Chronic pain syndrome: Secondary | ICD-10-CM | POA: Diagnosis not present

## 2020-02-19 DIAGNOSIS — I10 Essential (primary) hypertension: Secondary | ICD-10-CM | POA: Diagnosis not present

## 2020-02-19 DIAGNOSIS — E1159 Type 2 diabetes mellitus with other circulatory complications: Secondary | ICD-10-CM | POA: Diagnosis not present

## 2020-02-19 DIAGNOSIS — G4733 Obstructive sleep apnea (adult) (pediatric): Secondary | ICD-10-CM | POA: Diagnosis not present

## 2020-02-19 DIAGNOSIS — F0150 Vascular dementia without behavioral disturbance: Secondary | ICD-10-CM | POA: Diagnosis not present

## 2020-02-19 DIAGNOSIS — N183 Chronic kidney disease, stage 3 unspecified: Secondary | ICD-10-CM | POA: Diagnosis not present

## 2020-02-19 DIAGNOSIS — J449 Chronic obstructive pulmonary disease, unspecified: Secondary | ICD-10-CM | POA: Diagnosis not present

## 2020-02-19 DIAGNOSIS — Z0000 Encounter for general adult medical examination without abnormal findings: Secondary | ICD-10-CM | POA: Diagnosis not present

## 2020-02-19 DIAGNOSIS — I25119 Atherosclerotic heart disease of native coronary artery with unspecified angina pectoris: Secondary | ICD-10-CM | POA: Diagnosis not present

## 2020-02-19 DIAGNOSIS — G309 Alzheimer's disease, unspecified: Secondary | ICD-10-CM | POA: Diagnosis not present

## 2020-02-19 DIAGNOSIS — F0280 Dementia in other diseases classified elsewhere without behavioral disturbance: Secondary | ICD-10-CM | POA: Diagnosis not present

## 2020-02-22 ENCOUNTER — Telehealth: Payer: Self-pay | Admitting: Cardiovascular Disease

## 2020-02-22 NOTE — Telephone Encounter (Signed)
Called and spoke with pt's wife to review echo results. Wife verbalized understanding. Notified pt will need an appt 32mo post echo per Dr.Kelly's last office note. Notified we would send a reminder to schedule this appt. Wife verbalized understanding with no other questions at this time.  Recall is in system for 03/16/20  Will place a new recall.

## 2020-02-22 NOTE — Telephone Encounter (Signed)
    Pt's wife returning call about pt's echo results

## 2020-02-26 DIAGNOSIS — F0280 Dementia in other diseases classified elsewhere without behavioral disturbance: Secondary | ICD-10-CM | POA: Diagnosis not present

## 2020-02-26 DIAGNOSIS — R2689 Other abnormalities of gait and mobility: Secondary | ICD-10-CM | POA: Diagnosis not present

## 2020-02-26 DIAGNOSIS — G4733 Obstructive sleep apnea (adult) (pediatric): Secondary | ICD-10-CM | POA: Diagnosis not present

## 2020-02-26 DIAGNOSIS — G309 Alzheimer's disease, unspecified: Secondary | ICD-10-CM | POA: Diagnosis not present

## 2020-02-26 DIAGNOSIS — H9193 Unspecified hearing loss, bilateral: Secondary | ICD-10-CM | POA: Diagnosis not present

## 2020-02-26 DIAGNOSIS — F0150 Vascular dementia without behavioral disturbance: Secondary | ICD-10-CM | POA: Diagnosis not present

## 2020-03-05 DIAGNOSIS — G250 Essential tremor: Secondary | ICD-10-CM | POA: Diagnosis not present

## 2020-03-05 DIAGNOSIS — R21 Rash and other nonspecific skin eruption: Secondary | ICD-10-CM | POA: Diagnosis not present

## 2020-03-21 DIAGNOSIS — J449 Chronic obstructive pulmonary disease, unspecified: Secondary | ICD-10-CM | POA: Diagnosis not present

## 2020-03-21 DIAGNOSIS — G4733 Obstructive sleep apnea (adult) (pediatric): Secondary | ICD-10-CM | POA: Diagnosis not present

## 2020-03-27 ENCOUNTER — Telehealth: Payer: Self-pay | Admitting: Cardiovascular Disease

## 2020-03-27 NOTE — Telephone Encounter (Signed)
Called patient 03/27/20 to schedule follow up visit with Dr.Kelly from patients recall list, no answer, left message for patient to return call to get appt scheduled.

## 2020-04-01 ENCOUNTER — Encounter: Payer: Self-pay | Admitting: Cardiovascular Disease

## 2020-04-01 ENCOUNTER — Telehealth (INDEPENDENT_AMBULATORY_CARE_PROVIDER_SITE_OTHER): Payer: PPO | Admitting: Cardiovascular Disease

## 2020-04-01 VITALS — Ht 66.0 in | Wt 223.0 lb

## 2020-04-01 DIAGNOSIS — F0150 Vascular dementia without behavioral disturbance: Secondary | ICD-10-CM

## 2020-04-01 DIAGNOSIS — Z951 Presence of aortocoronary bypass graft: Secondary | ICD-10-CM

## 2020-04-01 DIAGNOSIS — G4733 Obstructive sleep apnea (adult) (pediatric): Secondary | ICD-10-CM

## 2020-04-01 DIAGNOSIS — G309 Alzheimer's disease, unspecified: Secondary | ICD-10-CM

## 2020-04-01 DIAGNOSIS — E785 Hyperlipidemia, unspecified: Secondary | ICD-10-CM

## 2020-04-01 DIAGNOSIS — I10 Essential (primary) hypertension: Secondary | ICD-10-CM | POA: Diagnosis not present

## 2020-04-01 DIAGNOSIS — I2510 Atherosclerotic heart disease of native coronary artery without angina pectoris: Secondary | ICD-10-CM

## 2020-04-01 DIAGNOSIS — F0280 Dementia in other diseases classified elsewhere without behavioral disturbance: Secondary | ICD-10-CM

## 2020-04-01 NOTE — Progress Notes (Signed)
Virtual Visit via Telephone Note   This visit type was conducted due to national recommendations for restrictions regarding the COVID-19 Pandemic (e.g. social distancing) in an effort to limit this patient's exposure and mitigate transmission in our community.  Due to his co-morbid illnesses, this patient is at least at moderate risk for complications without adequate follow up.  This format is felt to be most appropriate for this patient at this time.  The patient did not have access to video technology/had technical difficulties with video requiring transitioning to audio format only (telephone).  All issues noted in this document were discussed and addressed.  No physical exam could be performed with this format.  Please refer to the patient's chart for his  consent to telehealth for [facility].   The patient was identified using 2 identifiers.  Date:  04/01/2020   ID:  Daniel Mays, DOB [birth year], MRN [id number]  Patient Location: Home Provider Location: Office  PCP:  Daniel Mina, MD  Cardiologist:  Daniel Guadalajara, MD Electrophysiologist:  None   Evaluation Performed:  Follow-Up Visit  Chief Complaint:  6 month F/U  History of Present Illness:    Daniel Mays is a 79 y.o. male who has known CAD and in June 2007 suffered an inferior wall ST segment elevation myocardial infarction which was complicated by recurrent episodes of ventricular fibrillation requiring numerous to defibrillations, CPR, intra-aortic balloon pump insertion and pacemaker therapy. At that time, I performed successful PTCA of a otally occluded right coronary artery with restoration of TIMI-3 flow. Due to severe concomitant CAD I recommended elective CABG surgery which was done the following day by Dr. Dorris Mays. He underwent CABG x5 with a LIMA to the LAD, a vein to the diagonal, vein to the intermediate, sequential vein to the PDA and PLA branch of the right coronary artery. In June 2009 he  underwent 2 vessel intervention involving the LAD ostium and proximal intermediate vessel. In April 2014  a myocardial perfusion study was low risk without significant scar or ischemia in only mild inferolateral thinning. Ejection fraction 61%. He had normal wall motion. An echo Doppler study on 02/27/2014, which showed an ejection fraction of 45-50% without wall motion abnormality.  Estimated pulmonary pressure was normal.  He has a history of hyperlipidemia, obesity, and mild renal artery stenosis on duplex imaging.  He has a history of complex sleep apnea, and had not been on CPAP therapy for several years. He  was referred for a follow-up sleep study on 07/20/2013. This was done in a split-night protocol due to severe sleep apnea. His overall AHI was 65.7 per hour. He was unable to reach REM sleep in the baseline portion of the study. Oxygen dropped to 86% during non-REM sleep and has evidence for loud snoring. He was titrated to 13 cm water pressure but due to development of significant central events, BiPAP was started at 14/10 was increased to 15/11 cm. He received an AirSense 10 ResMed unit on 10/26/2013.  When I last saw him, he was having difficulty in significant changes were made teamed.  I reduced his EPAP min to  8 but he had the potential to increase to 18/14 if necessary.  We also changed him to a heated coil hose for improved humidification.  Subsequently, he has been sleeping significantly better.  He denies residual daytime sleepiness.  He is unaware of breakthrough snoring.  Presently he only minutes to infrequent use.  He was [facility] to [facility] and  was discharged 2 days later.  He presented with dysarthria, left face weakness, and confusion.  A CT of his head was negative for acute stroke.  Neurology was consulted.  An MRI of his brain was also negative for acute stroke but showed remote right MCA territory infarct.  A subsequent echo showed an EF of 60-65%.  Carotid  Dopplers revealed a 40-59% internal carotid stenosis on the right and left 1 a 39% with antegrade flow.  Lipid studies showed an LDL cholesterol of 54 total cholesterol 120, triglycerides were elevated at 184 and HDL was low at 29.    He was seen in the office in April 2018 by Daniel Mays with shortness of breath.  The patient had also seen Daniel Mays in [place] and was treated with a Mays of antibiotic and steroid therapy.  He responded to steroid therapy.  When he saw Daniel Mays, Daniel [facility]ays follow-up.  He was given a short Mays of increased diuretic regimen for several days but was uncertain if his shortness of breath was pulmonary or cardiac etiology.  Subsequent, he has felt improved.  He denies any recent wheezing.  He denies fevers, chills or night sweats.  He denies significant leg swelling.  He admits to 100% compliance with his CPAP therapy for his obstructive sleep apnea.  He tells me he was started on supplemental oxygen at night, which is black into his CPAP machine.   I saw him in March 2019, he had noticed increasing ankle edema.  He also has had difficulty moving his arm.  He does not exercise.  He admits to increasing shortness of breath with minimal activity.  He is not walking.  He sees Daniel Mays in [place].  He underwent a stress test on December 27, 2017.  EF was 50%.  There were no ST segment changes.  He had normal perfusion.  An echo Doppler study on January 04, 2018 showed an EF at 50 to 55% without wall motion abnormalities.  There was mild LVH.  Mild diastolic parameters.  There was no significant valvular pathology.  Recent laboratory a BNP of 93.  Normal renal function and LFTs.  Hemoglobin and hematocrit were stable.  Lipids were notable for elevated triglycerides at 183 and low HDL at 34.  Total cholesterol was 135 and LDL was 64.  TSH was normal.  He use BiPAP therapy with 100% compliance.    I last saw him in December 2020.  Since his prior evaluation in May 2019 he was having issues  with lower extremity edema.  He was on aspirin/Plavix, furosemide 40 mg daily, isosorbide 60 mg, metoprolol 50 mg twice a day, and ran out of spironolactone.  He was on simvastatin 20 mg.  He has not been using BiPAP with compliance.  A download was obtained in the office today from November 6 through September 08, 2019 which shows suboptimal compliance with only 23% of usage days and average usage 1 hour and 59 minutes per night.   His AHI was 9.8 with BiPAP auto with a minimum EPAP pressure of 8 and maximum IPAP pressure of 20 cm water pressure.  He was recently found to be vitamin D insufficient and was started on 50,000 units of vitamin D.  He states his recent hemoglobin A1c was 7.2.    During that evaluation, I recommended resumption of spironolactone.  We also had a lengthy discussion concerning improved compliance with BiPAP use.  I changed his BiPAP settings and increased his EPAP minimum to 8  and change his IPAP max to 18.  He stated he was using BiPAP more consistently but apparently 2 months ago his machine stopped working.  A download from April reveals his last BiPAP use was April 2.  Presently he denies any chest pain or significant shortness of breath.  He underwent a follow-up echo Doppler study on Feb 15, 2020 which showed an EF at 60 to 65%.  There was grade 1 diastolic dysfunction.  Valves were structurally normal.  There was borderline dilation of his aortic root at 40 mm.  He does note occasional swelling.  According to his wife who was involved in the telemedicine call today he has not been elevating his legs.  He has not been using support stockings.  He had undergone recent laboratory with the change of simvastatin to rosuvastatin for more aggressive lipid-lowering and I made 09/2020 LDL cholesterol decreased to 43.  He presents for telemedicine visit today.   The patient does not have symptoms concerning for COVID-19 infection (fever, chills, cough, or new shortness of breath).    Past  Medical History:  Diagnosis Date  . Arthritis    knees  . CAD (coronary artery disease)    2D ECHO, 03/17/2010 - EF 45-50%, normal  . Chronic kidney disease    blockage in renal artery  . Diabetes mellitus without complication (HCC)    diet controlled  . GERD (gastroesophageal reflux disease)   . Hard of hearing   . Heart attack (HCC) 2007  . Hyperlipidemia   . Hypertension   . OSA (obstructive sleep apnea)    CPAP  . Wears dentures    full upper and lower   Past Surgical History:  Procedure Laterality Date  . CARDIAC CATHETERIZATION  04/01/2008   LAD ostium stented with a 2.5x32mm Promus DES stent reducing a 95% stenosis to 0%; Proximal intermediate Ramus stented with a 2.75x81mm Promus drug-eluting stent reducing a 85% stenosis to 0% residual  . CARDIAC CATHETERIZATION  03/26/2008   Increased medical management  . CARDIAC CATHETERIZATION  04/27/2006   CABG recommended  . CATARACT EXTRACTION W/PHACO Left 04/16/2015   Procedure: CATARACT EXTRACTION PHACO AND INTRAOCULAR LENS PLACEMENT (IOC);  Surgeon: Lockie Mola, MD;  [facility];  Service: Ophthalmology;  Laterality: Left;  CPAP DIABETIC  . CORONARY ANGIOPLASTY WITH STENT PLACEMENT  2010  . CORONARY ARTERY BYPASS GRAFT  04/29/2006   LIMA to LAD, SVG to first diagonal, SVG to ramus intermedius, SVG to PDA, and SVG to PLA branch of RCA  . EXERCISE STRESS TEST  01/18/2013   Small fixed inferolateral defect-artifact is favored. No ischemia  . EYE SURGERY  1987  . TEE WITHOUT CARDIOVERSION  04/29/2006     No outpatient medications have been marked as taking for the 04/01/20 encounter (Telemedicine) with Lennette Bihari, MD.     Allergies:   Patient has no known allergies.   Social History   Tobacco Use  . Smoking status: Former Smoker    Packs/day: 5.00    Years: 55.00    Pack years: 275.00    Types: Cigarettes    Start date: 10/04/1946    Quit date: 10/04/2001    Years since quitting: 18.5  .  Smokeless tobacco: Never Used  Vaping Use  . Vaping Use: Never used  Substance Use Topics  . Alcohol use: Yes    Alcohol/week: 3.0 standard drinks    Types: 3 Shots of liquor per week    Comment: occ  .  Drug use: No     Family Hx: The patient's family history includes Diabetes in his mother; Parkinson's disease in his mother.  ROS:   Please see the history of present illness.    Positive for difficulty with hearing. No fevers chills night sweats No chest wall pain No awareness of palpitations No presyncope or syncope Positive for leg swelling, but improved Positive for dementia now on Aricept His BiPAP machine is no longer functional and will need replacement. All other systems reviewed and are negative.   Prior CV studies:   The following studies were reviewed today:  ECHO 02/15/2020 IMPRESSIONS  1. Left ventricular ejection fraction, by estimation, is 60 to 65%. The  left ventricle has normal function. The left ventricle has no regional  wall motion abnormalities. Left ventricular diastolic parameters are  consistent with Grade I diastolic  dysfunction (impaired relaxation).  2. Right ventricular systolic function is normal. The right ventricular  size is normal.  3. The mitral valve is normal in structure. No evidence of mitral valve  regurgitation. No evidence of mitral stenosis.  4. The aortic valve is normal in structure. Aortic valve regurgitation is  not visualized. No aortic stenosis is present.  5. Aortic dilatation noted. There is borderline dilatation of the aortic  root measuring 40 mm.  6. The inferior vena cava is normal in size with greater than 50%  respiratory variability, suggesting right atrial pressure of 3 mmHg.   Labs/Other Tests and Data Reviewed:    EKG:  An ECG dated 09/18/2019 was personally reviewed today and demonstrated: Normal sinus rhythm at 70 bpm, first-degree AV block with PR interval 218 ms, no ectopy.  QTc interval 440  ms.  May 2019 ECG (independently read by me): Sinus rhythm at 72 bpm.  First-degree AV block with a PR interval of 234 ms, specific T changes.  March 2019 ECG (independently read by me): normal sinus rhythm at 75 bpm.  Nonspecific ST changes.  Normal intervals.  No ectopy.  July 2018 ECG (independently read by me): Sinus bradycardia at 52 bpm.  First-degree AV block with a PR interval of 224 ms.  QTc interval normal.  No significant ST-T changes  March 2017 ECG (independently read by me):  Sinus rhythm with an occasional PVC. Nonspecific ST changes. Borderline first-degree AV block  Novembr 2015 ECG (independently read by me): Normal sinus rhythm with first degree AV block with a PR interval at 210 ms.  No significant ST segment changes.  June 2015 ECG (independently read by me): Normal sinus rhythm at 68 beats per minute.  Mild first degree AV block with PR interval 208 ms.  Nonspecific ST-T change   Recent Labs: No results found for requested labs within last 8760 hours.   Recent Lipid Panel Lab Results  Component Value Date/Time   CHOL 135 12/27/2017 11:06 AM   TRIG 183 (H) 12/27/2017 11:06 AM   HDL 34 (L) 12/27/2017 11:06 AM   CHOLHDL 4.0 12/27/2017 11:06 AM   CHOLHDL 4.1 07/29/2014 12:47 AM   LDLCALC 64 12/27/2017 11:06 AM    Wt Readings from Last 3 Encounters:  04/01/20 223 lb (101.2 kg)  09/18/19 240 lb (108.9 kg)  02/17/18 231 lb 6.4 oz (105 kg)     Objective:    Vital Signs:  Ht 5\' 6"  (1.676 m)   Wt 223 lb (101.2 kg)   BMI 35.99 kg/m    The patient did not have a blood pressure cuff at home. Breathing was  normal and not labored There was no audible wheezing He has difficulty hearing He denied any chest wall tenderness He denied any abnormal heart rhythm by palpation No abdominal discomfort Positive for leg swelling, improved from previously but not totally resolved Positive for dementia   ASSESSMENT & PLAN:    1. CAD/status post CABG  revascularization: In June 2007 he suffered an inferior wall STEMI complicated by recurrent episodes of ventricular fibrillation, CPR, intra-aortic balloon pump insertion and pacemaker.  He underwent successful PTCA of an occluded RCA due to severe concomitant CAD underwent CABG surgery x5 by Dr. Dorris Mays.  In June 2009 he underwent two-vessel intervention involving the LAD ostium and proximal intermediate vessel.  Nuclear perfusion study in April 2014 was low risk without scar or ischemia.  Presently he is not having any recurrent anginal symptoms and denies any significant exertional dyspnea. 2. Essential hypertension: He has been maintained on metoprolol tartrate 50 mg twice a day, spironolactone 12.5 mg, in addition to isosorbide 60 mg daily and Lasix 40 mg daily. 3. Hyperlipidemia: At his last office visit his simvastatin was discontinued and changed to rosuvastatin 20 mg.  Most recent lipid studies Feb 13, 2020 revealed LDL cholesterol 43. 4. OSA on BiPAP: He tells me the beginning of April his machine malfunctioned and is no longer operable.  [employer] has been his DME company.  I will order a new BiPAP auto ResMed air curve E unit and will set pressures at EPAP minimum of 10, IPAP max of 18. 5. Leg edema: He continues to be on furosemide 40 mg in addition to spironolactone.  Leg edema is improved.  His wife states that he keeps his legs dependent most of the time and does not elevate his legs when he is seated.  We discussed this today.  We also discussed using support stockings. 6. Dementia: He is now on Aricept per Daniel [facility]  COVID-19 Education: The signs and symptoms of COVID-19 were discussed with the patient and how to seek care for testing (follow up with PCP or arrange E-visit).  The importance of social distancing was discussed today.  Time:   Today, I have spent 27 minutes with the patient with telehealth technology discussing the above problems.     Medication Adjustments/Labs and  Tests Ordered: Current medicines are reviewed at length with the patient today.  Concerns regarding medicines are outlined above.   Tests Ordered: No orders of the defined types were placed in this encounter.   Medication Changes: No orders of the defined types were placed in this encounter.   Follow Up: In office follow-up in 6 months, unless he needs to be seen in the office 3 months after receiving his new BiPAP machine.  Signed, Daniel Guadalajara, MD  04/01/2020 12:59 PM    [facility]

## 2020-04-01 NOTE — Patient Instructions (Signed)
Medication Instructions:  CONTINUE WITH CURRENT MEDICATIONS. NO CHANGES.  *If you need a refill on your cardiac medications before your next appointment, please call your pharmacy*  Follow-Up: At [facility], you and your health needs are our priority.  As part of our continuing mission to provide you with exceptional heart care, we have created designated Provider Care Teams.  These Care Teams include your primary Cardiologist (physician) and Advanced Practice Providers (APPs -  Physician Assistants and Nurse Practitioners) who all work together to provide you with the care you need, when you need it.  We recommend signing up for the patient portal called "MyChart".  Sign up information is provided on this After Visit Summary.  MyChart is used to connect with patients for Virtual Visits (Telemedicine).  Patients are able to view lab/test results, encounter notes, upcoming appointments, etc.  Non-urgent messages can be sent to your provider as well.   To learn more about what you can do with MyChart, go to [email or web address].    Your next appointment:   6 month(s)  The format for your next appointment:   In Person  Provider:   Nicki Guadalajara, MD   Other Instructions NEW BIPAP MACHINE  KEEP YOUR LEGS ELEVATED AND WEAR COMPRESSION STOCKINGS

## 2020-04-03 ENCOUNTER — Telehealth: Payer: Self-pay | Admitting: *Deleted

## 2020-04-03 NOTE — Telephone Encounter (Signed)
-----   Message from June Leap June, RN sent at 04/01/2020  1:18 PM EDT ----- Pt needs a new bipap auto machine. His min epap is 10 with a max ipap of 18. Pressure support 4. He uses [employer]ria. Thank you

## 2020-04-03 NOTE — Telephone Encounter (Signed)
Faxed order for new BIPAP machine  to [employer].

## 2020-04-03 NOTE — Telephone Encounter (Signed)
Daniel Mays from [place] calling stating they only received the last page of the fax sent. Fax: [phone]

## 2020-04-22 DIAGNOSIS — J449 Chronic obstructive pulmonary disease, unspecified: Secondary | ICD-10-CM | POA: Diagnosis not present

## 2020-04-22 DIAGNOSIS — G4733 Obstructive sleep apnea (adult) (pediatric): Secondary | ICD-10-CM | POA: Diagnosis not present

## 2020-05-08 DIAGNOSIS — J449 Chronic obstructive pulmonary disease, unspecified: Secondary | ICD-10-CM | POA: Diagnosis not present

## 2020-05-08 DIAGNOSIS — G4733 Obstructive sleep apnea (adult) (pediatric): Secondary | ICD-10-CM | POA: Diagnosis not present

## 2020-05-23 DIAGNOSIS — G4733 Obstructive sleep apnea (adult) (pediatric): Secondary | ICD-10-CM | POA: Diagnosis not present

## 2020-05-23 DIAGNOSIS — J449 Chronic obstructive pulmonary disease, unspecified: Secondary | ICD-10-CM | POA: Diagnosis not present

## 2020-06-08 DIAGNOSIS — J449 Chronic obstructive pulmonary disease, unspecified: Secondary | ICD-10-CM | POA: Diagnosis not present

## 2020-06-08 DIAGNOSIS — G4733 Obstructive sleep apnea (adult) (pediatric): Secondary | ICD-10-CM | POA: Diagnosis not present

## 2020-07-08 DIAGNOSIS — G4733 Obstructive sleep apnea (adult) (pediatric): Secondary | ICD-10-CM | POA: Diagnosis not present

## 2020-07-08 DIAGNOSIS — J449 Chronic obstructive pulmonary disease, unspecified: Secondary | ICD-10-CM | POA: Diagnosis not present

## 2020-08-08 DIAGNOSIS — J449 Chronic obstructive pulmonary disease, unspecified: Secondary | ICD-10-CM | POA: Diagnosis not present

## 2020-08-08 DIAGNOSIS — G4733 Obstructive sleep apnea (adult) (pediatric): Secondary | ICD-10-CM | POA: Diagnosis not present

## 2020-08-14 DIAGNOSIS — G4733 Obstructive sleep apnea (adult) (pediatric): Secondary | ICD-10-CM | POA: Diagnosis not present

## 2020-08-14 DIAGNOSIS — J449 Chronic obstructive pulmonary disease, unspecified: Secondary | ICD-10-CM | POA: Diagnosis not present

## 2020-08-21 DIAGNOSIS — M5442 Lumbago with sciatica, left side: Secondary | ICD-10-CM | POA: Diagnosis not present

## 2020-08-21 DIAGNOSIS — R251 Tremor, unspecified: Secondary | ICD-10-CM | POA: Diagnosis not present

## 2020-08-21 DIAGNOSIS — M545 Low back pain, unspecified: Secondary | ICD-10-CM | POA: Diagnosis not present

## 2020-08-21 DIAGNOSIS — Z125 Encounter for screening for malignant neoplasm of prostate: Secondary | ICD-10-CM | POA: Diagnosis not present

## 2020-08-21 DIAGNOSIS — G8929 Other chronic pain: Secondary | ICD-10-CM | POA: Diagnosis not present

## 2020-08-21 DIAGNOSIS — E1159 Type 2 diabetes mellitus with other circulatory complications: Secondary | ICD-10-CM | POA: Diagnosis not present

## 2020-08-21 DIAGNOSIS — M79644 Pain in right finger(s): Secondary | ICD-10-CM | POA: Diagnosis not present

## 2020-08-21 DIAGNOSIS — I10 Essential (primary) hypertension: Secondary | ICD-10-CM | POA: Diagnosis not present

## 2020-08-24 ENCOUNTER — Other Ambulatory Visit: Payer: Self-pay | Admitting: Cardiovascular Disease

## 2020-09-02 DIAGNOSIS — M5459 Other low back pain: Secondary | ICD-10-CM | POA: Diagnosis not present

## 2020-09-05 DIAGNOSIS — R509 Fever, unspecified: Secondary | ICD-10-CM | POA: Diagnosis not present

## 2020-09-06 ENCOUNTER — Other Ambulatory Visit (HOSPITAL_COMMUNITY): Payer: Self-pay | Admitting: Nurse Practitioner

## 2020-09-07 DIAGNOSIS — J449 Chronic obstructive pulmonary disease, unspecified: Secondary | ICD-10-CM | POA: Diagnosis not present

## 2020-09-07 DIAGNOSIS — G4733 Obstructive sleep apnea (adult) (pediatric): Secondary | ICD-10-CM | POA: Diagnosis not present

## 2020-09-08 ENCOUNTER — Inpatient Hospital Stay (HOSPITAL_COMMUNITY)
Admission: EM | Admit: 2020-09-08 | Discharge: 2020-10-04 | DRG: 177 | Disposition: E | Payer: PPO | Attending: Internal Medicine | Admitting: Internal Medicine

## 2020-09-08 ENCOUNTER — Other Ambulatory Visit: Payer: Self-pay

## 2020-09-08 ENCOUNTER — Other Ambulatory Visit: Payer: Self-pay | Admitting: Nurse Practitioner

## 2020-09-08 ENCOUNTER — Encounter (HOSPITAL_COMMUNITY): Payer: Self-pay | Admitting: Student

## 2020-09-08 ENCOUNTER — Emergency Department (HOSPITAL_COMMUNITY): Payer: PPO

## 2020-09-08 ENCOUNTER — Ambulatory Visit (HOSPITAL_COMMUNITY)
Admission: RE | Admit: 2020-09-08 | Discharge: 2020-09-08 | Disposition: A | Discharge status: Home / Self Care | Payer: PPO | Source: Ambulatory Visit | Attending: Pulmonary Disease | Admitting: Pulmonary Disease

## 2020-09-08 DIAGNOSIS — I2510 Atherosclerotic heart disease of native coronary artery without angina pectoris: Secondary | ICD-10-CM | POA: Diagnosis present

## 2020-09-08 DIAGNOSIS — E669 Obesity, unspecified: Secondary | ICD-10-CM | POA: Diagnosis present

## 2020-09-08 DIAGNOSIS — J159 Unspecified bacterial pneumonia: Secondary | ICD-10-CM | POA: Diagnosis not present

## 2020-09-08 DIAGNOSIS — J069 Acute upper respiratory infection, unspecified: Secondary | ICD-10-CM

## 2020-09-08 DIAGNOSIS — D75839 Thrombocytosis, unspecified: Secondary | ICD-10-CM | POA: Diagnosis not present

## 2020-09-08 DIAGNOSIS — U071 COVID-19: Secondary | ICD-10-CM

## 2020-09-08 DIAGNOSIS — G9341 Metabolic encephalopathy: Secondary | ICD-10-CM | POA: Diagnosis not present

## 2020-09-08 DIAGNOSIS — R0902 Hypoxemia: Secondary | ICD-10-CM | POA: Diagnosis not present

## 2020-09-08 DIAGNOSIS — F0150 Vascular dementia without behavioral disturbance: Secondary | ICD-10-CM | POA: Diagnosis present

## 2020-09-08 DIAGNOSIS — G309 Alzheimer's disease, unspecified: Secondary | ICD-10-CM | POA: Diagnosis not present

## 2020-09-08 DIAGNOSIS — B342 Coronavirus infection, unspecified: Secondary | ICD-10-CM | POA: Diagnosis not present

## 2020-09-08 DIAGNOSIS — N1832 Chronic kidney disease, stage 3b: Secondary | ICD-10-CM | POA: Diagnosis not present

## 2020-09-08 DIAGNOSIS — H9190 Unspecified hearing loss, unspecified ear: Secondary | ICD-10-CM | POA: Diagnosis not present

## 2020-09-08 DIAGNOSIS — Z87891 Personal history of nicotine dependence: Secondary | ICD-10-CM

## 2020-09-08 DIAGNOSIS — M170 Bilateral primary osteoarthritis of knee: Secondary | ICD-10-CM | POA: Diagnosis not present

## 2020-09-08 DIAGNOSIS — F0390 Unspecified dementia without behavioral disturbance: Secondary | ICD-10-CM | POA: Diagnosis not present

## 2020-09-08 DIAGNOSIS — E785 Hyperlipidemia, unspecified: Secondary | ICD-10-CM | POA: Diagnosis not present

## 2020-09-08 DIAGNOSIS — I252 Old myocardial infarction: Secondary | ICD-10-CM

## 2020-09-08 DIAGNOSIS — J1282 Pneumonia due to coronavirus disease 2019: Secondary | ICD-10-CM | POA: Diagnosis present

## 2020-09-08 DIAGNOSIS — Z515 Encounter for palliative care: Secondary | ICD-10-CM | POA: Diagnosis not present

## 2020-09-08 DIAGNOSIS — E1165 Type 2 diabetes mellitus with hyperglycemia: Secondary | ICD-10-CM | POA: Diagnosis not present

## 2020-09-08 DIAGNOSIS — R0602 Shortness of breath: Secondary | ICD-10-CM

## 2020-09-08 DIAGNOSIS — N179 Acute kidney failure, unspecified: Secondary | ICD-10-CM | POA: Diagnosis not present

## 2020-09-08 DIAGNOSIS — E1122 Type 2 diabetes mellitus with diabetic chronic kidney disease: Secondary | ICD-10-CM | POA: Diagnosis present

## 2020-09-08 DIAGNOSIS — G4733 Obstructive sleep apnea (adult) (pediatric): Secondary | ICD-10-CM | POA: Diagnosis present

## 2020-09-08 DIAGNOSIS — Z7982 Long term (current) use of aspirin: Secondary | ICD-10-CM

## 2020-09-08 DIAGNOSIS — T380X5A Adverse effect of glucocorticoids and synthetic analogues, initial encounter: Secondary | ICD-10-CM | POA: Diagnosis not present

## 2020-09-08 DIAGNOSIS — J80 Acute respiratory distress syndrome: Secondary | ICD-10-CM | POA: Diagnosis not present

## 2020-09-08 DIAGNOSIS — R1310 Dysphagia, unspecified: Secondary | ICD-10-CM | POA: Diagnosis not present

## 2020-09-08 DIAGNOSIS — Z7189 Other specified counseling: Secondary | ICD-10-CM

## 2020-09-08 DIAGNOSIS — J440 Chronic obstructive pulmonary disease with acute lower respiratory infection: Secondary | ICD-10-CM | POA: Diagnosis not present

## 2020-09-08 DIAGNOSIS — Z951 Presence of aortocoronary bypass graft: Secondary | ICD-10-CM | POA: Diagnosis not present

## 2020-09-08 DIAGNOSIS — Z7902 Long term (current) use of antithrombotics/antiplatelets: Secondary | ICD-10-CM

## 2020-09-08 DIAGNOSIS — Z820 Family history of epilepsy and other diseases of the nervous system: Secondary | ICD-10-CM

## 2020-09-08 DIAGNOSIS — R7989 Other specified abnormal findings of blood chemistry: Secondary | ICD-10-CM | POA: Diagnosis not present

## 2020-09-08 DIAGNOSIS — Z833 Family history of diabetes mellitus: Secondary | ICD-10-CM

## 2020-09-08 DIAGNOSIS — G9340 Encephalopathy, unspecified: Secondary | ICD-10-CM | POA: Diagnosis not present

## 2020-09-08 DIAGNOSIS — Z66 Do not resuscitate: Secondary | ICD-10-CM | POA: Diagnosis present

## 2020-09-08 DIAGNOSIS — I129 Hypertensive chronic kidney disease with stage 1 through stage 4 chronic kidney disease, or unspecified chronic kidney disease: Secondary | ICD-10-CM | POA: Diagnosis present

## 2020-09-08 DIAGNOSIS — J9811 Atelectasis: Secondary | ICD-10-CM | POA: Diagnosis not present

## 2020-09-08 DIAGNOSIS — Z955 Presence of coronary angioplasty implant and graft: Secondary | ICD-10-CM | POA: Diagnosis not present

## 2020-09-08 DIAGNOSIS — R918 Other nonspecific abnormal finding of lung field: Secondary | ICD-10-CM | POA: Diagnosis not present

## 2020-09-08 DIAGNOSIS — Z79899 Other long term (current) drug therapy: Secondary | ICD-10-CM

## 2020-09-08 DIAGNOSIS — R54 Age-related physical debility: Secondary | ICD-10-CM | POA: Diagnosis present

## 2020-09-08 DIAGNOSIS — K219 Gastro-esophageal reflux disease without esophagitis: Secondary | ICD-10-CM | POA: Diagnosis present

## 2020-09-08 DIAGNOSIS — I701 Atherosclerosis of renal artery: Secondary | ICD-10-CM | POA: Diagnosis present

## 2020-09-08 DIAGNOSIS — Z7951 Long term (current) use of inhaled steroids: Secondary | ICD-10-CM

## 2020-09-08 DIAGNOSIS — Z6835 Body mass index (BMI) 35.0-35.9, adult: Secondary | ICD-10-CM

## 2020-09-08 LAB — RESP PANEL BY RT-PCR (FLU A&B, COVID) ARPGX2
Influenza A by PCR: NEGATIVE
Influenza B by PCR: NEGATIVE
SARS Coronavirus 2 by RT PCR: POSITIVE — AB

## 2020-09-08 LAB — BLOOD GAS, ARTERIAL
Acid-base deficit: 1.1 mmol/L (ref 0.0–2.0)
Bicarbonate: 25.8 mmol/L (ref 20.0–28.0)
Drawn by: 25788
FIO2: 68
O2 Content: 12 L/min
O2 Saturation: 94.8 %
Patient temperature: 98.6
pCO2 arterial: 54.7 mmHg — ABNORMAL HIGH (ref 32.0–48.0)
pH, Arterial: 7.295 — ABNORMAL LOW (ref 7.350–7.450)
pO2, Arterial: 82.1 mmHg — ABNORMAL LOW (ref 83.0–108.0)

## 2020-09-08 LAB — COMPREHENSIVE METABOLIC PANEL
ALT: 23 U/L (ref 0–44)
AST: 39 U/L (ref 15–41)
Albumin: 3.9 g/dL (ref 3.5–5.0)
Alkaline Phosphatase: 39 U/L (ref 38–126)
Anion gap: 12 (ref 5–15)
BUN: 34 mg/dL — ABNORMAL HIGH (ref 8–23)
CO2: 25 mmol/L (ref 22–32)
Calcium: 8.8 mg/dL — ABNORMAL LOW (ref 8.9–10.3)
Chloride: 102 mmol/L (ref 98–111)
Creatinine, Ser: 1.71 mg/dL — ABNORMAL HIGH (ref 0.61–1.24)
GFR, Estimated: 40 mL/min — ABNORMAL LOW (ref >60)
Glucose, Bld: 143 mg/dL — ABNORMAL HIGH (ref 70–99)
Potassium: 4.4 mmol/L (ref 3.5–5.1)
Sodium: 139 mmol/L (ref 135–145)
Total Bilirubin: 0.4 mg/dL (ref 0.3–1.2)
Total Protein: 8.3 g/dL — ABNORMAL HIGH (ref 6.5–8.1)

## 2020-09-08 LAB — LACTATE DEHYDROGENASE: LDH: 334 U/L — ABNORMAL HIGH (ref 98–192)

## 2020-09-08 LAB — FERRITIN: Ferritin: 114 ng/mL (ref 24–336)

## 2020-09-08 LAB — BRAIN NATRIURETIC PEPTIDE: B Natriuretic Peptide: 97.6 pg/mL (ref 0.0–100.0)

## 2020-09-08 LAB — GLUCOSE, CAPILLARY
Glucose-Capillary: 134 mg/dL — ABNORMAL HIGH (ref 70–99)
Glucose-Capillary: 181 mg/dL — ABNORMAL HIGH (ref 70–99)

## 2020-09-08 LAB — CBC WITH DIFFERENTIAL/PLATELET
Abs Immature Granulocytes: 0.08 10*3/uL — ABNORMAL HIGH (ref 0.00–0.07)
Basophils Absolute: 0 10*3/uL (ref 0.0–0.1)
Basophils Relative: 0 %
Eosinophils Absolute: 0 10*3/uL (ref 0.0–0.5)
Eosinophils Relative: 0 %
HCT: 45.3 % (ref 39.0–52.0)
Hemoglobin: 14 g/dL (ref 13.0–17.0)
Immature Granulocytes: 1 %
Lymphocytes Relative: 5 %
Lymphs Abs: 0.7 10*3/uL (ref 0.7–4.0)
MCH: 30 pg (ref 26.0–34.0)
MCHC: 30.9 g/dL (ref 30.0–36.0)
MCV: 97.2 fL (ref 80.0–100.0)
Monocytes Absolute: 1.3 10*3/uL — ABNORMAL HIGH (ref 0.1–1.0)
Monocytes Relative: 11 %
Neutro Abs: 9.9 10*3/uL — ABNORMAL HIGH (ref 1.7–7.7)
Neutrophils Relative %: 83 %
Platelets: 268 10*3/uL (ref 150–400)
RBC: 4.66 MIL/uL (ref 4.22–5.81)
RDW: 13.1 % (ref 11.5–15.5)
WBC: 12 10*3/uL — ABNORMAL HIGH (ref 4.0–10.5)
nRBC: 0 % (ref 0.0–0.2)

## 2020-09-08 LAB — MRSA PCR SCREENING: MRSA by PCR: NEGATIVE

## 2020-09-08 LAB — BLOOD GAS, VENOUS
Acid-base deficit: 0.5 mmol/L (ref 0.0–2.0)
Acid-base deficit: 0.9 mmol/L (ref 0.0–2.0)
Bicarbonate: 27.9 mmol/L (ref 20.0–28.0)
Bicarbonate: 27.4 mmol/L (ref 20.0–28.0)
O2 Saturation: 20.6 %
O2 Saturation: 63.6 %
Patient temperature: 98.6
Patient temperature: 98.6
pCO2, Ven: 62.2 mmHg — ABNORMAL HIGH (ref 44.0–60.0)
pCO2, Ven: 64.7 mmHg — ABNORMAL HIGH (ref 44.0–60.0)
pH, Ven: 7.274 (ref 7.250–7.430)
pH, Ven: 7.251 (ref 7.250–7.430)
pO2, Ven: <31 mmHg — CL (ref 32.0–45.0)
pO2, Ven: 38.4 mmHg (ref 32.0–45.0)

## 2020-09-08 LAB — LACTIC ACID, PLASMA
Lactic Acid, Venous: 1.7 mmol/L (ref 0.5–1.9)
Lactic Acid, Venous: 1.3 mmol/L (ref 0.5–1.9)

## 2020-09-08 LAB — C-REACTIVE PROTEIN: CRP: 23.9 mg/dL — ABNORMAL HIGH (ref <1.0)

## 2020-09-08 LAB — TRIGLYCERIDES: Triglycerides: 170 mg/dL — ABNORMAL HIGH (ref <150)

## 2020-09-08 LAB — PROCALCITONIN: Procalcitonin: 0.2 ng/mL

## 2020-09-08 LAB — CBG MONITORING, ED: Glucose-Capillary: 128 mg/dL — ABNORMAL HIGH (ref 70–99)

## 2020-09-08 LAB — TROPONIN I (HIGH SENSITIVITY)
Troponin I (High Sensitivity): 30 ng/L — ABNORMAL HIGH (ref <18)
Troponin I (High Sensitivity): 33 ng/L — ABNORMAL HIGH (ref <18)

## 2020-09-08 LAB — FIBRINOGEN: Fibrinogen: >800 mg/dL — ABNORMAL HIGH (ref 210–475)

## 2020-09-08 LAB — D-DIMER, QUANTITATIVE: D-Dimer, Quant: 3.7 ug/mL-FEU — ABNORMAL HIGH (ref 0.00–0.50)

## 2020-09-08 MED ORDER — ZINC SULFATE 220 (50 ZN) MG PO CAPS
220.0000 mg | ORAL_CAPSULE | Freq: Every day | ORAL | Status: DC
  Administered 2020-09-09 – 2020-09-12 (×4): 220 mg via ORAL
  Filled 2020-09-08 (×5): qty 1

## 2020-09-08 MED ORDER — METOPROLOL TARTRATE 25 MG PO TABS
50.0000 mg | ORAL_TABLET | Freq: Two times a day (BID) | ORAL | Status: DC
  Administered 2020-09-09 – 2020-09-12 (×8): 50 mg via ORAL
  Filled 2020-09-08 (×9): qty 2

## 2020-09-08 MED ORDER — DEXAMETHASONE SODIUM PHOSPHATE 10 MG/ML IJ SOLN
10.0000 mg | Freq: Once | INTRAMUSCULAR | Status: AC
  Administered 2020-09-08: 10 mg via INTRAVENOUS
  Filled 2020-09-08: qty 1

## 2020-09-08 MED ORDER — ROSUVASTATIN CALCIUM 20 MG PO TABS
20.0000 mg | ORAL_TABLET | Freq: Every day | ORAL | Status: DC
  Administered 2020-09-09 – 2020-09-12 (×4): 20 mg via ORAL
  Filled 2020-09-08 (×5): qty 1

## 2020-09-08 MED ORDER — INSULIN ASPART 100 UNIT/ML ~~LOC~~ SOLN
0.0000 [IU] | SUBCUTANEOUS | Status: DC
  Administered 2020-09-08: 1 [IU] via SUBCUTANEOUS
  Administered 2020-09-08 – 2020-09-09 (×3): 2 [IU] via SUBCUTANEOUS
  Administered 2020-09-09: 1 [IU] via SUBCUTANEOUS
  Administered 2020-09-09 (×2): 2 [IU] via SUBCUTANEOUS
  Administered 2020-09-09: 1 [IU] via SUBCUTANEOUS
  Administered 2020-09-10 (×2): 2 [IU] via SUBCUTANEOUS
  Administered 2020-09-10: 3 [IU] via SUBCUTANEOUS
  Administered 2020-09-10 – 2020-09-12 (×13): 2 [IU] via SUBCUTANEOUS
  Administered 2020-09-12: 1 [IU] via SUBCUTANEOUS
  Administered 2020-09-12: 2 [IU] via SUBCUTANEOUS
  Administered 2020-09-13 (×3): 1 [IU] via SUBCUTANEOUS
  Administered 2020-09-13: 01:00:00 2 [IU] via SUBCUTANEOUS
  Filled 2020-09-08: qty 0.09

## 2020-09-08 MED ORDER — ACETAMINOPHEN 325 MG PO TABS
650.0000 mg | ORAL_TABLET | Freq: Four times a day (QID) | ORAL | Status: DC | PRN
  Administered 2020-09-09 – 2020-09-10 (×3): 650 mg via ORAL
  Filled 2020-09-08 (×3): qty 2

## 2020-09-08 MED ORDER — CHLORHEXIDINE GLUCONATE CLOTH 2 % EX PADS
6.0000 | MEDICATED_PAD | Freq: Every day | CUTANEOUS | Status: DC
  Administered 2020-09-08 – 2020-09-13 (×6): 6 via TOPICAL

## 2020-09-08 MED ORDER — METHYLPREDNISOLONE SODIUM SUCC 125 MG IJ SOLR
0.5000 mg/kg | Freq: Two times a day (BID) | INTRAMUSCULAR | Status: AC
  Administered 2020-09-08 – 2020-09-11 (×6): 50 mg via INTRAVENOUS
  Filled 2020-09-08 (×6): qty 2

## 2020-09-08 MED ORDER — CHLORHEXIDINE GLUCONATE 0.12 % MT SOLN
15.0000 mL | Freq: Two times a day (BID) | OROMUCOSAL | Status: DC
  Administered 2020-09-08 – 2020-09-13 (×10): 15 mL via OROMUCOSAL
  Filled 2020-09-08 (×8): qty 15

## 2020-09-08 MED ORDER — DOCUSATE SODIUM 100 MG PO CAPS
100.0000 mg | ORAL_CAPSULE | Freq: Two times a day (BID) | ORAL | Status: DC
  Administered 2020-09-09 – 2020-09-12 (×7): 100 mg via ORAL
  Filled 2020-09-08 (×8): qty 1

## 2020-09-08 MED ORDER — ASPIRIN EC 81 MG PO TBEC
81.0000 mg | DELAYED_RELEASE_TABLET | Freq: Every day | ORAL | Status: DC
  Administered 2020-09-10 – 2020-09-12 (×3): 81 mg via ORAL
  Filled 2020-09-08 (×4): qty 1

## 2020-09-08 MED ORDER — PANTOPRAZOLE SODIUM 40 MG PO TBEC
40.0000 mg | DELAYED_RELEASE_TABLET | Freq: Every day | ORAL | Status: DC
  Administered 2020-09-10 – 2020-09-12 (×3): 40 mg via ORAL
  Filled 2020-09-08 (×5): qty 1

## 2020-09-08 MED ORDER — ORAL CARE MOUTH RINSE
15.0000 mL | Freq: Two times a day (BID) | OROMUCOSAL | Status: DC
  Administered 2020-09-09 – 2020-09-12 (×8): 15 mL via OROMUCOSAL

## 2020-09-08 MED ORDER — ORAL CARE MOUTH RINSE
15.0000 mL | Freq: Two times a day (BID) | OROMUCOSAL | Status: DC
  Administered 2020-09-08 – 2020-09-12 (×5): 15 mL via OROMUCOSAL

## 2020-09-08 MED ORDER — DONEPEZIL HCL 5 MG PO TABS
10.0000 mg | ORAL_TABLET | Freq: Every day | ORAL | Status: DC
  Administered 2020-09-09 – 2020-09-12 (×4): 10 mg via ORAL
  Filled 2020-09-08 (×6): qty 2

## 2020-09-08 MED ORDER — ONDANSETRON HCL 4 MG PO TABS: 4.0000 mg | ORAL_TABLET | Freq: Four times a day (QID) | ORAL | Status: DC | PRN

## 2020-09-08 MED ORDER — ONDANSETRON HCL 4 MG/2ML IJ SOLN: 4.0000 mg | Freq: Four times a day (QID) | INTRAMUSCULAR | Status: DC | PRN

## 2020-09-08 MED ORDER — THIAMINE HCL 100 MG PO TABS
100.0000 mg | ORAL_TABLET | Freq: Every day | ORAL | Status: DC
  Administered 2020-09-09 – 2020-09-12 (×4): 100 mg via ORAL
  Filled 2020-09-08 (×5): qty 1

## 2020-09-08 MED ORDER — CLOPIDOGREL BISULFATE 75 MG PO TABS
75.0000 mg | ORAL_TABLET | Freq: Every day | ORAL | Status: DC
  Administered 2020-09-09 – 2020-09-12 (×4): 75 mg via ORAL
  Filled 2020-09-08 (×6): qty 1

## 2020-09-08 MED ORDER — BARICITINIB 2 MG PO TABS
2.0000 mg | ORAL_TABLET | Freq: Every day | ORAL | Status: DC
  Administered 2020-09-09 – 2020-09-12 (×4): 2 mg via ORAL
  Filled 2020-09-08 (×5): qty 1

## 2020-09-08 MED ORDER — ADULT MULTIVITAMIN W/MINERALS CH
1.0000 | ORAL_TABLET | Freq: Every day | ORAL | Status: DC
  Administered 2020-09-09 – 2020-09-12 (×4): 1 via ORAL
  Filled 2020-09-08 (×5): qty 1

## 2020-09-08 MED ORDER — HYDROCOD POLST-CPM POLST ER 10-8 MG/5ML PO SUER
5.0000 mL | Freq: Two times a day (BID) | ORAL | Status: DC | PRN
  Administered 2020-09-10: 5 mL via ORAL
  Filled 2020-09-08: qty 5

## 2020-09-08 MED ORDER — HEPARIN SODIUM (PORCINE) 5000 UNIT/ML IJ SOLN
5000.0000 [IU] | Freq: Three times a day (TID) | INTRAMUSCULAR | Status: DC
  Administered 2020-09-08 – 2020-09-13 (×16): 5000 [IU] via SUBCUTANEOUS
  Filled 2020-09-08 (×16): qty 1

## 2020-09-08 MED ORDER — ASCORBIC ACID 500 MG PO TABS
500.0000 mg | ORAL_TABLET | Freq: Every day | ORAL | Status: DC
  Administered 2020-09-09 – 2020-09-12 (×4): 500 mg via ORAL
  Filled 2020-09-08 (×5): qty 1

## 2020-09-08 MED ORDER — PREDNISONE 20 MG PO TABS
50.0000 mg | ORAL_TABLET | Freq: Every day | ORAL | Status: DC
  Administered 2020-09-11 – 2020-09-12 (×2): 50 mg via ORAL
  Filled 2020-09-08 (×3): qty 2

## 2020-09-08 MED ORDER — GUAIFENESIN-DM 100-10 MG/5ML PO SYRP
10.0000 mL | ORAL_SOLUTION | ORAL | Status: DC | PRN
  Administered 2020-09-10 – 2020-09-11 (×2): 10 mL via ORAL
  Filled 2020-09-08 (×2): qty 10

## 2020-09-08 MED ORDER — FOLIC ACID 1 MG PO TABS
1.0000 mg | ORAL_TABLET | Freq: Every day | ORAL | Status: DC
  Administered 2020-09-09 – 2020-09-12 (×4): 1 mg via ORAL
  Filled 2020-09-08 (×5): qty 1

## 2020-09-08 MED ORDER — LINAGLIPTIN 5 MG PO TABS
5.0000 mg | ORAL_TABLET | Freq: Every day | ORAL | Status: DC
  Administered 2020-09-09 – 2020-09-12 (×4): 5 mg via ORAL
  Filled 2020-09-08 (×6): qty 1

## 2020-09-08 NOTE — ED Provider Notes (Signed)
[facility]-EMERGENCY DEPT Provider Note   CSN: [id number] Arrival date & time: 09/12/2020  1108     History Chief Complaint  Patient presents with  . hypoxia  . Covid Positive    Daniel Mays is a 79 y.o. male.  HPI   79 year old male with past medical history of HTN, HLD, CAD status post stents/CABG, COPD, DM presents to the emergency department with reported hypoxia.  Patient was diagnosed with Covid on 12/3.  He was scheduled to get the antibody treatment today and that is when they noticed that he was hypoxic.  Reportedly patient was hypoxic into the 70s.  Patient is oriented to self and place but otherwise gives minimal answers, denies any pain.  No one is accompanying the patient, the wife is reportedly out in the car and Covid negative.  Past Medical History:  Diagnosis Date  . Arthritis    knees  . CAD (coronary artery disease)    2D ECHO, 03/17/2010 - EF 45-50%, normal  . Chronic kidney disease    blockage in renal artery  . Diabetes mellitus without complication (HCC)    diet controlled  . GERD (gastroesophageal reflux disease)   . Hard of hearing   . Heart attack (HCC) 2007  . Hyperlipidemia   . Hypertension   . OSA (obstructive sleep apnea)    CPAP  . Wears dentures    full upper and lower    Patient Active Problem List   Diagnosis Date Noted  . COPD (chronic obstructive pulmonary disease) (HCC) 03/10/2017  . GERD (gastroesophageal reflux disease) 03/10/2017  . Heartburn 03/10/2017  . Hypertension 03/10/2017  . MI (myocardial infarction) (HCC) 03/10/2017  . Nocturia 03/10/2017  . Mixed Alzheimer's and vascular dementia (HCC) 07/20/2016  . Obesity (BMI 30-39.9) 07/20/2016  . TIA (transient ischemic attack) 07/28/2014  . Obesity 06/20/2013  . Hyperlipidemia with target LDL less than 70 03/22/2013  . Sleep apnea, obstructive 03/22/2013  . Renal artery stenosis (HCC) 03/22/2013  . Renal cyst 03/22/2013  . Coronary  atherosclerosis of native coronary artery 03/22/2013  . Dyspnea 08/16/2012    Past Surgical History:  Procedure Laterality Date  . CARDIAC CATHETERIZATION  04/01/2008   LAD ostium stented with a 2.5x16mm Promus DES stent reducing a 95% stenosis to 0%; Proximal intermediate Ramus stented with a 2.75x65mm Promus drug-eluting stent reducing a 85% stenosis to 0% residual  . CARDIAC CATHETERIZATION  03/26/2008   Increased medical management  . CARDIAC CATHETERIZATION  04/27/2006   CABG recommended  . CATARACT EXTRACTION W/PHACO Left 04/16/2015   Procedure: CATARACT EXTRACTION PHACO AND INTRAOCULAR LENS PLACEMENT (IOC);  Surgeon: Lockie Mola, MD;  Location: [facility];  Service: Ophthalmology;  Laterality: Left;  CPAP DIABETIC  . CORONARY ANGIOPLASTY WITH STENT PLACEMENT  2010  . CORONARY ARTERY BYPASS GRAFT  04/29/2006   LIMA to LAD, SVG to first diagonal, SVG to ramus intermedius, SVG to PDA, and SVG to PLA branch of RCA  . EXERCISE STRESS TEST  01/18/2013   Small fixed inferolateral defect-artifact is favored. No ischemia  . EYE SURGERY  1987  . TEE WITHOUT CARDIOVERSION  04/29/2006       Family History  Problem Relation Age of Onset  . Parkinson's disease Mother   . Diabetes Mother     Social History   Tobacco Use  . Smoking status: Former Smoker    Packs/day: 5.00    Years: 55.00    Pack years: 275.00    Types: Cigarettes  Start date: 10/04/1946    Quit date: 10/04/2001    Years since quitting: 18.9  . Smokeless tobacco: Never Used  Vaping Use  . Vaping Use: Never used  Substance Use Topics  . Alcohol use: Yes    Alcohol/week: 3.0 standard drinks    Types: 3 Shots of liquor per week    Comment: occ  . Drug use: No    Home Medications Prior to Admission medications   Medication Sig Start Date End Date Taking? Authorizing Provider  acetaminophen (TYLENOL) 500 MG tablet Take 500 mg by mouth 3 (three) times daily.    [provider]  albuterol  (PROAIR HFA) 108 (90 Base) MCG/ACT inhaler Inhale 90 mcg into the lungs. 12/02/15   [provider]  albuterol (PROVENTIL) (2.5 MG/3ML) 0.083% nebulizer solution Take 3 mLs (2.5 mg total) by nebulization every 6 (six) hours as needed for wheezing or shortness of breath. DX: COPD DX Code: J44.9 03/11/17   Erin Fulling, MD  aspirin EC 81 MG tablet Take 81 mg by mouth daily. AM    [provider]  budesonide-formoterol (SYMBICORT) 160-4.5 MCG/ACT inhaler Inhale 2 puffs into the lungs 2 (two) times daily. 03/10/17   Erin Fulling, MD  clopidogrel (PLAVIX) 75 MG tablet Take 1 tablet by mouth once daily 11/22/19   Lennette Bihari, MD  donepezil (ARICEPT) 5 MG tablet Take 5 mg by mouth daily. 02/04/17   [provider]  ergocalciferol (VITAMIN D2) 1.25 MG (50000 UT) capsule Take by mouth. 09/04/19   [provider]  furosemide (LASIX) 40 MG tablet TAKE 1 TABLET BY MOUTH ONCE DAILY -  NEED  OFFICE  VISIT 11/22/19   Lennette Bihari, MD  isosorbide mononitrate (IMDUR) 60 MG 24 hr tablet Take 1 tablet (60 mg total) by mouth daily. Please make annual appt with Dr. Tresa Endo for future refills. [phone]. 1st attempt. 08/21/19   Lennette Bihari, MD  metoprolol tartrate (LOPRESSOR) 50 MG tablet TAKE 1 TABLET BY MOUTH TWICE DAILY -  NEED  OFFICE  VISIT 11/22/19   Lennette Bihari, MD  pantoprazole (PROTONIX) 40 MG tablet TAKE ONE TABLET BY MOUTH ONCE DAILY 09/19/18   Marykay Lex, MD  potassium chloride SA (KLOR-CON) 20 MEQ tablet Take 1 tablet by mouth once daily 10/15/19   Lennette Bihari, MD  rosuvastatin (CRESTOR) 20 MG tablet Take 1 tablet by mouth once daily 08/26/20   Lennette Bihari, MD  spironolactone (ALDACTONE) 25 MG tablet Take 1/2 (one-half) tablet by mouth once daily 08/26/20   Lennette Bihari, MD    Allergies    Patient has no known allergies.  Review of Systems   Review of Systems  Unable to perform ROS: Acuity of condition    Physical Exam Updated Vital Signs BP  (!) 153/74   Pulse 86   Temp 99.1 F (37.3 C) (Oral)   Resp (!) 22   SpO2 94%   Physical Exam Vitals and nursing note reviewed.  Constitutional:      General: He is in acute distress.     Appearance: He is ill-appearing.     Comments: Drowsy but oriented, answers to name  HENT:     Head: Normocephalic.     Mouth/Throat:     Mouth: Mucous membranes are dry.  Cardiovascular:     Rate and Rhythm: Tachycardia present.  Pulmonary:     Effort: Respiratory distress present.     Breath sounds: Wheezing present.  Abdominal:  General: There is no distension.     Palpations: Abdomen is soft.  Musculoskeletal:     Right lower leg: No edema.     Left lower leg: No edema.  Skin:    General: Skin is warm.  Neurological:     Mental Status: Mental status is at baseline.     ED Results / Procedures / Treatments   Labs (all labs ordered are listed, but only abnormal results are displayed) Labs Reviewed  RESP PANEL BY RT-PCR (FLU A&B, COVID) [id number]  CULTURE, BLOOD (ROUTINE X 2)  CULTURE, BLOOD (ROUTINE X 2)  LACTIC ACID, PLASMA  LACTIC ACID, PLASMA  CBC WITH DIFFERENTIAL/PLATELET  COMPREHENSIVE METABOLIC PANEL  D-DIMER, QUANTITATIVE (NOT AT [facility])  PROCALCITONIN  LACTATE DEHYDROGENASE  FERRITIN  TRIGLYCERIDES  FIBRINOGEN  C-REACTIVE PROTEIN  BRAIN NATRIURETIC PEPTIDE  BLOOD GAS, VENOUS  TROPONIN I (HIGH SENSITIVITY)    EKG EKG Interpretation  Date/Time:  Monday 09/26/2020 11:22:58 EST Ventricular Rate:  101 PR Interval:    QRS Duration: 97 QT Interval:  322 QTC Calculation: 418 R Axis:   108 Text Interpretation: Sinus tachycardia Atrial premature complex Right axis deviation Low voltage, precordial leads Borderline T abnormalities, diffuse leads Sinus tachycardia, T wave flattening Confirmed by Coralee Pesa [phone]) on Sep 26, 2020 11:45:25 AM   Radiology No results found.  Procedures Procedures (including critical care time)  Medications Ordered in  ED Medications  dexamethasone (DECADRON) injection 10 mg (has no administration in time range)    ED Course  I have reviewed the triage vital signs and the nursing notes.  Pertinent labs & imaging results that were available during my care of the patient were reviewed by me and considered in my medical decision making (see chart for details).  Clinical Course as of Sep 08 1545  [facility] Sep 26, 2020  1147 Patient was reportedly hypoxic on arrival with 6 L.  Respiratory at bedside, will transition the patient to high flow, his oxygenation improved to greater than 92%.  He is slightly tachypneic.  He has increased work of breathing.  Blood pressure is stable, low-grade temp.  EKG shows sinus tachycardia, T wave flattening.  Steroids ordered, blood work will be drawn.  I spoke with the patient's wife who is Covid negative and outside in the car. Neither of them are vaccinated. She states that he was Covid positive on 12/30, his breathing has been worsening for the last 3 days.  He does wear CPAP at night, was supposed to get the monoclonal treatment today but is otherwise not any medication.  She states he is periodically confused at baseline.   [KH]  1218 Patient has improved on high flow, work of breathing is still increased but improved, oxygenation is 94% or above.  His mentation is slightly improved as well.  We are pending blood work.  Chest x-ray shows bilateral infiltrates consistent with viral pneumonia, no pneumothorax.  Heart rate has stabilized in the 80s, lower suspicion for pulmonary embolism at this time given the improvement with the high flow.   [KH]  1536 Patient continues to be on high flow, saturation greater than 93%.  Work of breathing has remained the same, heart rate and blood pressure are stable.  Patient is difficult to evaluate mentation.  The wife admits that he is periodically confused and possibly hard of hearing but the patient is slow to respond, sometimes has appropriate  answers, sometimes does not have a coherent response.  Repeat VBG shows a PCO2 of  64 with improved PO2, this does not appear to be acutely hypercarbic.  Because of his work of breathing and mentation ICU was consulted, they have agreed to accept the patient.  He is stable on high flow nasal cannula at time of admission.   [KH]    Clinical Course User Index [KH] Mohamud Mrozek, Clabe Seal, DO   MDM Rules/Calculators/A&P                          Patient presented Covid positive with hypoxia.  Placed on high flow nasal cannula, oxygenation has stabilized.  But due to work of breathing and mental status ICU was consulted and has accepted the patient.  Patient is stable on high flow at time of admission. Final Clinical Impression(s) / ED Diagnoses Final diagnoses:  None    Rx / DC Orders ED Discharge Orders    None       Rozelle Logan, DO 09/22/2020 1547

## 2020-09-08 NOTE — Progress Notes (Signed)
I connected by phone with Abby Potash on 09/06/2020 at 10:52 AM to discuss the potential use of a treatment for mild to moderate COVID-19 viral infection in non-hospitalized patients.  This patient is a 78 y.o. male that meets the FDA criteria for Emergency Use Authorization of bamlanivimab/etesevimab, casirivimab\imdevimab, or sotrovimab  Has a (+) direct SARS-CoV-2 viral test result  Has mild or moderate COVID-19   Is ? 79 years of age and weighs ? 40 kg  Is NOT hospitalized due to COVID-19  Is NOT requiring oxygen therapy or requiring an increase in baseline oxygen flow rate due to COVID-19  Is within 10 days of symptom onset  Has at least one of the high risk factor(s) for progression to severe COVID-19 and/or hospitalization as defined in EUA.  Specific high risk criteria : Older age (>/= 81 yo), BMI > 25, Diabetes and Cardiovascular disease or hypertension   Patient's 10th day of symptoms will be 09-12-20. He has dementia but family believes he can sit for infusion.   I have spoken and communicated the following to the patient or parent/caregiver:  1. FDA has authorized the emergency use of bamlanivimab/etesevimab, casirivimab\imdevimab, or sotrovimab for the treatment of mild to moderate COVID-19 in adults and pediatric patients with positive results of direct SARS-CoV-2 viral testing who are 19 years of age and older weighing at least 40 kg, and who are at high risk for progressing to severe COVID-19 and/or hospitalization.  2. The significant known and potential risks and benefits of bamlanivimab/etesevimab, casirivimab\imdevimab, or sotrovimab, and the extent to which such potential risks and benefits are unknown.  3. Information on available alternative treatments and the risks and benefits of those alternatives, including clinical trials.  4. Patients treated with bamlanivimab/etesevimab, casirivimab\imdevimab, or sotrovimab should continue to self-isolate and use  infection control measures (e.g., wear mask, isolate, social distance, avoid sharing personal items, clean and disinfect "high touch" surfaces, and frequent handwashing) according to CDC guidelines.   5. The patient or parent/caregiver has the option to accept or refuse bamlanivimab/etesevimab, casirivimab\imdevimab, or sotrovimab.  After reviewing this information with the patient, the patient has agreed to receive one of the available covid 19 monoclonal antibodies and will be provided an appropriate fact sheet prior to infusion.   ER precautions reinforced.  Consuello Masse, DNP, AGNP-C [phone] (Infusion Center Hotline)

## 2020-09-08 NOTE — ED Triage Notes (Signed)
Patient BIB POV to the infusion clinic for the antibody transfusion.  On arrival patient was 78% on room air, clinic placed on 6L via nasal cannula with improvement to 89-90%.  Patient was then brought over to the ER, on arrival patient was 77-85% on 6L so patient was placed on NRB @ 15L.

## 2020-09-08 NOTE — Progress Notes (Signed)
Nurse said the doctor would like to place the pt on BIPAP. RT had concerns due to the Pt having covid. RT thinks the Pt has some underlying conditions but wonders if bipap is going to help. RT asked the nurse if they had consulted ccm.   RT spoke to the doctor and she said she had consulted ccm and jhold off on the bipap. RT will continue to monitor

## 2020-09-08 NOTE — ED Notes (Signed)
Respiratory made aware that [place], EDP would like patient placed on bipap for CO2 62

## 2020-09-08 NOTE — Progress Notes (Signed)
09/29/2020  1030  Patient arrived at infusion clinic and his O2 75%RA, [employer] oxygen O2 89% on 6L . No infusion given. Transported patient to ED Rm# [id number] for further evaluation, pt's wife went to ED with patient.

## 2020-09-08 NOTE — Discharge Instructions (Signed)
10 Things You Can Do to Manage Your COVID-19 Symptoms at Home If you have possible or confirmed COVID-19: 1. Stay home from work and school. And stay away from other public places. If you must go out, avoid using any kind of public transportation, ridesharing, or taxis. 2. Monitor your symptoms carefully. If your symptoms get worse, call your healthcare provider immediately. 3. Get rest and stay hydrated. 4. If you have a medical appointment, call the healthcare provider ahead of time and tell them that you have or may have COVID-19. 5. For medical emergencies, call 911 and notify the dispatch personnel that you have or may have COVID-19. 6. Cover your cough and sneezes with a tissue or use the inside of your elbow. 7. Wash your hands often with soap and water for at least 20 seconds or clean your hands with an alcohol-based hand sanitizer that contains at least 60% alcohol. 8. As much as possible, stay in a specific room and away from other people in your home. Also, you should use a separate bathroom, if available. If you need to be around other people in or outside of the home, wear a mask. 9. Avoid sharing personal items with other people in your household, like dishes, towels, and bedding. 10. Clean all surfaces that are touched often, like counters, tabletops, and doorknobs. Use household cleaning sprays or wipes according to the label instructions. cdc.gov/coronavirus 04/04/2019 This information is not intended to replace advice given to you by your health care provider. Make sure you discuss any questions you have with your health care provider. Document Revised: 09/06/2019 Document Reviewed: 09/06/2019 Elsevier Patient Education  2020 [employer]  

## 2020-09-08 NOTE — H&P (Signed)
NAME:  Daniel Mays, MRN:  [id number], DOB:  [birth year], LOS: 0 ADMISSION DATE:  09/27/2020, CONSULTATION DATE:  12/6 REFERRING MD:  Dr. Wilkie Aye, CHIEF COMPLAINT:  Hypoxic Respiratory Failure   Brief History   79 y/o M admitted 12/6 with hypoxemic respiratory failure in the setting of COVID PNA.    History of present illness   79 y/o M who presented to the [facility] on 12/6 for planned outpatient monoclonal antibody treatment for known COVID infection.  Per his son he is not vaccinated against Covid.  He was diagnosed with COVID on 12/3 and became progressively more short of breath. On arrival to the infusion clinic, he was found to have room air saturations of 75%. He was treated with 6L O2 and transferred to [facility] ER for evaluation.  CXR demonstrated low lung volumes, diffuse bilateral infiltrates consistent with COVID PNA. Initial ABG 7.295 / 54.7 / 82.1 / 25.8.  Labs notable for Na 139, K 4.4, Cl 102, CO2 25, glucose 143, BUN 34 / Sr Cr 1.71, BNP 97, LDH 334, HS troponin 30, WBC 12, Hgb 14, Platelets 268, D-Dimer 3.70, fibrinogen >800. O2 needs progressed to 12L HFNC with sats of 90-94%.  He was confused on presentation, oriented to self and place.   PCCM consulted for evaluation.   Past Medical History  CAD s/p stent, CABG HTN  HLD  COPD   Renal Artery Stenosis - identified in 2016, ~ 60% on left, 1-59% on right  DM   OSA - on CPAP   Significant Hospital Events   12/06 Admit with COVID PNA, hypoxic respiratory failure, AKI   Consults:    Procedures:    Significant Diagnostic Tests:    Micro Data:  COVID 12/6 >> positive  Influenza A/B 12/6 >> negative  BCx2 12/6 >>   Antimicrobials:    Interim history/subjective:    Objective   Blood pressure (!) 153/62, pulse 81, temperature 99.1 F (37.3 C), temperature source Oral, resp. rate (!) 21, SpO2 92 %.       No intake or output data in the 24 hours ending 09/07/2020 1544 There were no vitals filed  for this visit.  Examination: General: Chronically ill-appearing elderly man lying in bed HEENT: [place]/AT, MM pink/moist, edentulous Neuro: Arousable from sleep, globally weak, takes significant stimulation to get him to follow commands, symmetric grip strength.  Tongue protrudes midline and moves side to side.  Does not follow all commands during exam. CV: s1s2, tachycardic, regular rhythm, no m/r/g PULM: Breathing comfortably on nasal cannula, no accessory muscle use.  Faint rales. GI: Obese, soft, bsx4 active  Extremities: warm/dry, no peripheral edema, no clubbing or cyanosis Skin: no rashes or lesions  CXR personally reviewed 12/6-low lung volumes, bilateral airspace disease.  Resolved Hospital Problem list     Assessment & Plan:   Acute Hypoxemic Respiratory Failure secondary to COVID PNA with ARDS Severe hypoxic resp failure in setting of COVID PNA.  P/F ratio 102, consistent with ARDS -admit to ICU given neuro status with severe respiratory failure -solumedrol 50 mg IV Q12 for 3 days, then transition to 50 mg prednisone QD -baricitinib dosing per pharmacy.  Given severe respiratory failure not recommended to receive remdesivir.  Out of the window for monoclonal antibody. -wean O2 for sats >85% at rest, anticipate desaturation with exertion and slow recovery -prone positioning as tolerated  -Incentive spirometry -follow serial inflammatory markers  -high risk for intubation  -NPO x meds for now, high  risk for aspiration  OSA  COPD without Exacerbation On CPAP at baseline -Continue nocturnal CPAP  -pulmicort + brovana BID  -hold home symbicort, doubt he can adequately inhale medication at this point   AKI on CKD  Renal Artery Stenosis  -Trend BMP / urinary output -Replace electrolytes as indicated -Avoid nephrotoxic agents, ensure adequate renal perfusion -Holding PTA spironolactone and Lasix  CAD s/p Stents, CABG  HTN  HLD -continue home ASA, crestor,  plavix -continue lopressor 50 mg BID -hold home lasix, spironolactone -hold imdur > ?  Unsure if he was actually taking based on home med list review   DM II  Reportedly diet controlled -linagliptin  -SSI, sensitive scale   GERD -continue PPI while inpatient / on high dose steroids   Dementia, questionable acute encephalopathy superimposed due to acute illness -continue Aricept  -at risk delirium with steoids   Best practice (evaluated daily)  Diet: NPO x meds  Pain/Anxiety/Delirium protocol (if indicated): n/a  VAP protocol (if indicated): n/a  DVT prophylaxis: heparin SQ GI prophylaxis: PPI  Glucose control: SSI, linagliptin  Mobility: As tolerated  Last date of multidisciplinary goals of care discussion:  Family and staff present  Summary of discussion  Follow up goals of care discussion due: 12/13 Code Status: full Disposition: ICU   Labs   CBC: Recent Labs  Lab 09/30/20 1151  WBC 12.0*  NEUTROABS 9.9*  HGB 14.0  HCT 45.3  MCV 97.2  PLT 268    Basic Metabolic Panel: Recent Labs  Lab 09-30-2020 1151  NA 139  K 4.4  CL 102  CO2 25  GLUCOSE 143*  BUN 34*  CREATININE 1.71*  CALCIUM 8.8*   GFR: CrCl cannot be calculated (Unknown ideal weight.). Recent Labs  Lab September 30, 2020 1151 September 30, 2020 1349  PROCALCITON 0.20  --   WBC 12.0*  --   LATICACIDVEN 1.7 1.3    Liver Function Tests: Recent Labs  Lab 2020-09-30 1151  AST 39  ALT 23  ALKPHOS 39  BILITOT 0.4  PROT 8.3*  ALBUMIN 3.9   No results for input(s): LIPASE, AMYLASE in the last 168 hours. No results for input(s): AMMONIA in the last 168 hours.  ABG    Component Value Date/Time   PHART 7.295 (L) 2020/09/30 1406   PCO2ART 54.7 (H) 09/30/2020 1406   PO2ART 82.1 (L) 09-30-20 1406   HCO3 27.4 09-30-2020 1458   ACIDBASEDEF 0.9 09/30/20 1458   O2SAT 63.6 30-Sep-2020 1458     Coagulation Profile: No results for input(s): INR, PROTIME in the last 168 hours.  Cardiac Enzymes: No  results for input(s): CKTOTAL, CKMB, CKMBINDEX, TROPONINI in the last 168 hours.  HbA1C: Hgb A1c MFr Bld  Date/Time Value Ref Range Status  07/28/2014 05:00 PM 6.4 (H) <5.7 % Final    Comment:    (NOTE)                                                                       According to the ADA Clinical Practice Recommendations for 2011, when HbA1c is used as a screening test:  >=6.5%   Diagnostic of Diabetes Mellitus           (if abnormal result is confirmed) 5.7-6.4%   Increased risk  of developing Diabetes Mellitus References:Diagnosis and Classification of Diabetes Mellitus,Diabetes Care,2011,34(Suppl 1):S62-S69 and Standards of Medical Care in         Diabetes - 2011,Diabetes Care,2011,34 (Suppl 1):S11-S61.    CBG: No results for input(s): GLUCAP in the last 168 hours.  Review of Systems:   Unable to complete ROS with patient due to baseline dementia and acute confusion in the setting of hypoxic respiratory failure.   Past Medical History  He,  has a past medical history of Arthritis, CAD (coronary artery disease), Chronic kidney disease, Diabetes mellitus without complication (HCC), GERD (gastroesophageal reflux disease), Hard of hearing, Heart attack (HCC) (2007), Hyperlipidemia, Hypertension, OSA (obstructive sleep apnea), and Wears dentures.   Surgical History    Past Surgical History:  Procedure Laterality Date  . CARDIAC CATHETERIZATION  04/01/2008   LAD ostium stented with a 2.5x25mm Promus DES stent reducing a 95% stenosis to 0%; Proximal intermediate Ramus stented with a 2.75x19mm Promus drug-eluting stent reducing a 85% stenosis to 0% residual  . CARDIAC CATHETERIZATION  03/26/2008   Increased medical management  . CARDIAC CATHETERIZATION  04/27/2006   CABG recommended  . CATARACT EXTRACTION W/PHACO Left 04/16/2015   Procedure: CATARACT EXTRACTION PHACO AND INTRAOCULAR LENS PLACEMENT (IOC);  Surgeon: Lockie Mola, MD;  Location: [facility];  Service:  Ophthalmology;  Laterality: Left;  CPAP DIABETIC  . CORONARY ANGIOPLASTY WITH STENT PLACEMENT  2010  . CORONARY ARTERY BYPASS GRAFT  04/29/2006   LIMA to LAD, SVG to first diagonal, SVG to ramus intermedius, SVG to PDA, and SVG to PLA branch of RCA  . EXERCISE STRESS TEST  01/18/2013   Small fixed inferolateral defect-artifact is favored. No ischemia  . EYE SURGERY  1987  . TEE WITHOUT CARDIOVERSION  04/29/2006     Social History   reports that he quit smoking about 18 years ago. His smoking use included cigarettes. He started smoking about 73 years ago. He has a 275.00 pack-year smoking history. He has never used smokeless tobacco. He reports current alcohol use of about 3.0 standard drinks of alcohol per week. He reports that he does not use drugs.   Family History   His family history includes Diabetes in his mother; Parkinson's disease in his mother.   Allergies No Known Allergies   Home Medications  Prior to Admission medications   Medication Sig Start Date End Date Taking? Authorizing Provider  acetaminophen (TYLENOL) 500 MG tablet Take 1,000 mg by mouth every 8 (eight) hours as needed for mild pain.    Yes [provider]  albuterol (PROAIR HFA) 108 (90 Base) MCG/ACT inhaler Inhale 1 puff into the lungs every 6 (six) hours as needed for wheezing.  12/02/15  Yes [provider]  albuterol (PROVENTIL) (2.5 MG/3ML) 0.083% nebulizer solution Take 3 mLs (2.5 mg total) by nebulization every 6 (six) hours as needed for wheezing or shortness of breath. DX: COPD DX Code: J44.9 03/11/17  Yes Erin Fulling, MD  aspirin EC 81 MG tablet Take 81 mg by mouth daily. AM   Yes [provider]  clopidogrel (PLAVIX) 75 MG tablet Take 1 tablet by mouth once daily Patient taking differently: Take 75 mg by mouth daily.  11/22/19  Yes Lennette Bihari, MD  donepezil (ARICEPT) 10 MG tablet Take 10 mg by mouth at bedtime. 08/02/20  Yes [provider]  furosemide (LASIX) 40 MG  tablet TAKE 1 TABLET BY MOUTH ONCE DAILY -  NEED  OFFICE  VISIT Patient taking differently: Take  40 mg by mouth daily.  11/22/19  Yes Lennette Bihari, MD  metoprolol tartrate (LOPRESSOR) 50 MG tablet TAKE 1 TABLET BY MOUTH TWICE DAILY -  NEED  OFFICE  VISIT Patient taking differently: Take 50 mg by mouth 2 (two) times daily.  11/22/19  Yes Lennette Bihari, MD  potassium chloride SA (KLOR-CON) 20 MEQ tablet Take 1 tablet by mouth once daily Patient taking differently: Take 20 mEq by mouth daily.  10/15/19  Yes Lennette Bihari, MD  rosuvastatin (CRESTOR) 20 MG tablet Take 1 tablet by mouth once daily Patient taking differently: Take 20 mg by mouth daily.  08/26/20  Yes Lennette Bihari, MD  spironolactone (ALDACTONE) 25 MG tablet Take 1/2 (one-half) tablet by mouth once daily Patient taking differently: Take 12.5 mg by mouth daily.  08/26/20  Yes Lennette Bihari, MD  triamcinolone (KENALOG) 0.1 % Apply 1 application topically 3 (three) times daily as needed for rash. 08/21/20  Yes [provider]  budesonide-formoterol (SYMBICORT) 160-4.5 MCG/ACT inhaler Inhale 2 puffs into the lungs 2 (two) times daily. Patient not taking: Reported on 09/30/2020 03/10/17   Erin Fulling, MD  isosorbide mononitrate (IMDUR) 60 MG 24 hr tablet Take 1 tablet (60 mg total) by mouth daily. Please make annual appt with Dr. Tresa Endo for future refills. [phone]. 1st attempt. Patient not taking: Reported on 09/06/2020 08/21/19   Lennette Bihari, MD  pantoprazole (PROTONIX) 40 MG tablet TAKE ONE TABLET BY MOUTH ONCE DAILY Patient not taking: Reported on 09/07/2020 09/19/18   Marykay Lex, MD     Critical care time: 35 min.      Steffanie Dunn, DO 09/24/2020 5:30 PM [facility]

## 2020-09-09 ENCOUNTER — Inpatient Hospital Stay (HOSPITAL_COMMUNITY): Payer: PPO

## 2020-09-09 DIAGNOSIS — F0390 Unspecified dementia without behavioral disturbance: Secondary | ICD-10-CM

## 2020-09-09 DIAGNOSIS — G9340 Encephalopathy, unspecified: Secondary | ICD-10-CM

## 2020-09-09 DIAGNOSIS — R1310 Dysphagia, unspecified: Secondary | ICD-10-CM

## 2020-09-09 LAB — CBC WITH DIFFERENTIAL/PLATELET
Abs Immature Granulocytes: 0.08 10*3/uL — ABNORMAL HIGH (ref 0.00–0.07)
Basophils Absolute: 0 10*3/uL (ref 0.0–0.1)
Basophils Relative: 0 %
Eosinophils Absolute: 0 10*3/uL (ref 0.0–0.5)
Eosinophils Relative: 0 %
HCT: 43.7 % (ref 39.0–52.0)
Hemoglobin: 13.5 g/dL (ref 13.0–17.0)
Immature Granulocytes: 1 %
Lymphocytes Relative: 7 %
Lymphs Abs: 0.7 10*3/uL (ref 0.7–4.0)
MCH: 30.1 pg (ref 26.0–34.0)
MCHC: 30.9 g/dL (ref 30.0–36.0)
MCV: 97.3 fL (ref 80.0–100.0)
Monocytes Absolute: 0.4 10*3/uL (ref 0.1–1.0)
Monocytes Relative: 4 %
Neutro Abs: 8.7 10*3/uL — ABNORMAL HIGH (ref 1.7–7.7)
Neutrophils Relative %: 88 %
Platelets: 275 10*3/uL (ref 150–400)
RBC: 4.49 MIL/uL (ref 4.22–5.81)
RDW: 13 % (ref 11.5–15.5)
WBC: 9.9 10*3/uL (ref 4.0–10.5)
nRBC: 0 % (ref 0.0–0.2)

## 2020-09-09 LAB — COMPREHENSIVE METABOLIC PANEL
ALT: 22 U/L (ref 0–44)
AST: 31 U/L (ref 15–41)
Albumin: 3.6 g/dL (ref 3.5–5.0)
Alkaline Phosphatase: 38 U/L (ref 38–126)
Anion gap: 13 (ref 5–15)
BUN: 42 mg/dL — ABNORMAL HIGH (ref 8–23)
CO2: 23 mmol/L (ref 22–32)
Calcium: 8.7 mg/dL — ABNORMAL LOW (ref 8.9–10.3)
Chloride: 104 mmol/L (ref 98–111)
Creatinine, Ser: 1.67 mg/dL — ABNORMAL HIGH (ref 0.61–1.24)
GFR, Estimated: 41 mL/min — ABNORMAL LOW (ref >60)
Glucose, Bld: 168 mg/dL — ABNORMAL HIGH (ref 70–99)
Potassium: 5 mmol/L (ref 3.5–5.1)
Sodium: 140 mmol/L (ref 135–145)
Total Bilirubin: 0.8 mg/dL (ref 0.3–1.2)
Total Protein: 7.7 g/dL (ref 6.5–8.1)

## 2020-09-09 LAB — GLUCOSE, CAPILLARY
Glucose-Capillary: 137 mg/dL — ABNORMAL HIGH (ref 70–99)
Glucose-Capillary: 166 mg/dL — ABNORMAL HIGH (ref 70–99)
Glucose-Capillary: 168 mg/dL — ABNORMAL HIGH (ref 70–99)
Glucose-Capillary: 150 mg/dL — ABNORMAL HIGH (ref 70–99)

## 2020-09-09 LAB — C-REACTIVE PROTEIN: CRP: 19.8 mg/dL — ABNORMAL HIGH (ref <1.0)

## 2020-09-09 LAB — FERRITIN: Ferritin: 134 ng/mL (ref 24–336)

## 2020-09-09 LAB — MAGNESIUM: Magnesium: 2.8 mg/dL — ABNORMAL HIGH (ref 1.7–2.4)

## 2020-09-09 LAB — PHOSPHORUS: Phosphorus: 3.9 mg/dL (ref 2.5–4.6)

## 2020-09-09 LAB — D-DIMER, QUANTITATIVE: D-Dimer, Quant: 3.13 ug/mL-FEU — ABNORMAL HIGH (ref 0.00–0.50)

## 2020-09-09 MED ORDER — RESOURCE THICKENUP CLEAR PO POWD
ORAL | Status: DC | PRN
  Filled 2020-09-09: qty 125

## 2020-09-09 NOTE — Progress Notes (Signed)
Took pt off bipap and placed on 8l Nasal cannula. Pt is in no distress.Pt awake and alert asking for water.

## 2020-09-09 NOTE — Evaluation (Addendum)
Clinical/Bedside Swallow Evaluation Patient Details  Name: Daniel Mays MRN: [id number] Date of Birth: [birth year]  Today's Date: 09/09/2020 Time: SLP Start Time (ACUTE ONLY): 1105 SLP Stop Time (ACUTE ONLY): 1205 SLP Time Calculation (min) (ACUTE ONLY): 60 min  Past Medical History:  Past Medical History:  Diagnosis Date  . Arthritis    knees  . CAD (coronary artery disease)    2D ECHO, 03/17/2010 - EF 45-50%, normal  . Chronic kidney disease    blockage in renal artery  . Diabetes mellitus without complication (HCC)    diet controlled  . GERD (gastroesophageal reflux disease)   . Hard of hearing   . Heart attack (HCC) 2007  . Hyperlipidemia   . Hypertension   . OSA (obstructive sleep apnea)    CPAP  . Wears dentures    full upper and lower   Past Surgical History:  Past Surgical History:  Procedure Laterality Date  . CARDIAC CATHETERIZATION  04/01/2008   LAD ostium stented with a 2.5x51mm Promus DES stent reducing a 95% stenosis to 0%; Proximal intermediate Ramus stented with a 2.75x44mm Promus drug-eluting stent reducing a 85% stenosis to 0% residual  . CARDIAC CATHETERIZATION  03/26/2008   Increased medical management  . CARDIAC CATHETERIZATION  04/27/2006   CABG recommended  . CATARACT EXTRACTION W/PHACO Left 04/16/2015   Procedure: CATARACT EXTRACTION PHACO AND INTRAOCULAR LENS PLACEMENT (IOC);  Surgeon: Lockie Mola, MD;  Location: [facility];  Service: Ophthalmology;  Laterality: Left;  CPAP DIABETIC  . CORONARY ANGIOPLASTY WITH STENT PLACEMENT  2010  . CORONARY ARTERY BYPASS GRAFT  04/29/2006   LIMA to LAD, SVG to first diagonal, SVG to ramus intermedius, SVG to PDA, and SVG to PLA branch of RCA  . EXERCISE STRESS TEST  01/18/2013   Small fixed inferolateral defect-artifact is favored. No ischemia  . EYE SURGERY  1987  . TEE WITHOUT CARDIOVERSION  04/29/2006   HPI:  COVID dx 12/3, pna, h/o dementia, prior smoker, COPD, CAD s/p CABG, HLD, HTN, ?  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  CXR showed diffuse infiltrates consistent with COVID pna.  No prior swallow evaluations in system.  Head tilted to the right during entire study, pt states "My neck doesn't work" and states this is a frequent occurrence.   Assessment / Plan / Recommendation Clinical Impression  Suspect pt presenting with a primary esophageal dysphagia with symptoms of overt coughing and regurgitation of plain water after sequential swallows of water.  Pt then advised he does become "choked" with water at home at times. Cough x3 noted at baseline with movement or talking.  No indication of aspiration with tsp nectar, entire 4 ounces of nectar via cup/straw, applesauce nor solids.  Pt only demonstrates difficulties with thin.  Of note, approx 10 minutes after testing, pt's HR elevated to 150s and then decreased to 60s. Pt was asymptomatic at that time - RN was aware and checking to assure pt was ok. Unable to monitor oxygen saturation during entire session as after several changes of pulse ox and cord to pulse ox, it did not work.      Due to concerns for dysphagia *primary esophageal* and potential impact of respiratory/swallow dyssynchrony, recommend nectar - full liquids and consider esophagram when pt is able to travel for testing.  SLP phoned wife after testing and she stated pt has been "coughing forever" and rarely he coughs up food but it does occur at  times.  He also has h/o heart burn per Claris Che. Claris Che also states he eats well PTA.    Suspect some baseline dysphagia that may be exacerbated with his current illness and deconditioning.  Will follow up. RN informed and agreeable to plan.   SLP Visit Diagnosis: Dysphagia, unspecified (R13.10)    Aspiration Risk  Moderate aspiration risk;Severe aspiration risk    Diet Recommendation Nectar-thick liquid (full liquids, tsp thin water ok)   Liquid Administration via:  Cup;Straw Medication Administration: Whole meds with puree Supervision: Full supervision/cueing for compensatory strategies Compensations: Slow rate;Small sips/bites Postural Changes: Seated upright at 90 degrees;Remain upright for at least 30 minutes after po intake    Other  Recommendations Recommended Consults: Consider esophageal assessment Oral Care Recommendations: Oral care BID   Follow up Recommendations None      Frequency and Duration min 1 x/week  2 weeks       Prognosis Prognosis for Safe Diet Advancement: Fair Barriers to Reach Goals: Cognitive deficits;Other (Comment) Barriers/Prognosis Comment: medical diagnosis      Swallow Study   General Date of Onset: 09/09/20 HPI: COVID dx 12/3, pna, h/o dementia, prior smoker, COPD, CAD s/p CABG, HLD, HTN, ? 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  CXR showed diffuse infiltrates consistent with COVID pna.  No prior swallow evaluations in system. Type of Study: Bedside Swallow Evaluation Diet Prior to this Study: NPO Temperature Spikes Noted: No History of Recent Intubation: No Behavior/Cognition: Alert;Cooperative;Pleasant mood (at times pt stares off during session) Oral Care Completed by SLP: Yes Oral Cavity - Dentition: Dentures, not available (pt does not wear dentures at home) Self-Feeding Abilities: Needs assist Patient Positioning: Upright in bed Baseline Vocal Quality: Low vocal intensity Volitional Cough: Weak Volitional Swallow: Able to elicit    Oral/Motor/Sensory Function Overall Oral Motor/Sensory Function: Other (comment) (? minimal left facial asymmetry, pt has h/o right temporal and frontal lobe cva diagnosed in 2014)   Ice Chips Ice chips: Not tested   Thin Liquid Thin Liquid: Impaired Presentation: Cup;Self Fed;Spoon;Straw Oral Phase Impairments: Reduced labial seal Oral Phase Functional Implications: Right anterior spillage Pharyngeal  Phase  Impairments: Cough - Delayed Other Comments: delayed overt coughing with straw sequential swallows x5, coughing was significant and pt finally expectorted thin water  with max cues to "[place]"- appeared to be approx 1 tsp expectorated, tsp cough x1, cup/straw- no cough x    Nectar Thick Nectar Thick Liquid: Within functional limits Presentation: Cup;Self Fed;Spoon;Straw   Honey Thick Honey Thick Liquid: Not tested   Puree Puree: Within functional limits   Solid     Solid: Within functional limits Presentation: Self Fed Other Comments: graham cracker laced with applesauce       Chales Abrahams 09/09/2020,12:48 PM  Rolena Infante, MS [facility] SLP Acute Rehab Services Office [phone] Pager [phone]

## 2020-09-09 NOTE — TOC Initial Note (Signed)
Transition of Care [facility]) - Initial/Assessment Note    Patient Details  Name: Daniel Mays MRN: [id number] Date of Birth: Apr 08, 1941  [facility]) CM/SW Contact:    Golda Acre, RN Phone Number: 09/09/2020, 8:01 AM  Clinical Narrative:                 79 y/o M who presented to the [facility] on 12/6 for planned outpatient monoclonal antibody treatment for known COVID infection.  Per his son he is not vaccinated against Covid.  He was diagnosed with COVID on 12/3 and became progressively more short of breath. On arrival to the infusion clinic, he was found to have room air saturations of 75%. He was treated with 6L O2 and transferred to [facility] ER for evaluation.  CXR demonstrated low lung volumes, diffuse bilateral infiltrates consistent with COVID PNA. Initial ABG 7.295 / 54.7 / 82.1 / 25.8.  Labs notable for Na 139, K 4.4, Cl 102, CO2 25, glucose 143, BUN 34 / Sr Cr 1.71, BNP 97, LDH 334, HS troponin 30, WBC 12, Hgb 14, Platelets 268, D-Dimer 3.70, fibrinogen >800. O2 needs progressed to 12L HFNC with sats of 90-94%.  He was confused on presentation, oriented to self and place.  On bi-pap PLAN_ to return home Following for progression Expected Discharge Plan: Home/Self Care Barriers to Discharge: No Barriers Identified   Patient Goals and CMS Choice Patient states their goals for this hospitalization and ongoing recovery are:: to go back home CMS Medicare.gov Compare Post Acute Care list provided to:: Patient Choice offered to / list presented to : Patient  Expected Discharge Plan and Services Expected Discharge Plan: Home/Self Care   Discharge Planning Services: CM Consult   Living arrangements for the past 2 months: Single Family Home                                      Prior Living Arrangements/Services Living arrangements for the past 2 months: Single Family Home Lives with:: Spouse Patient language and need for interpreter  reviewed:: Yes        Need for Family Participation in Patient Care: Yes (Comment) (son and spouse) Care giver support system in place?: Yes (comment) (son and spouse)   Criminal Activity/Legal Involvement Pertinent to Current Situation/Hospitalization: No - Comment as needed  Activities of Daily Living Home Assistive Devices/Equipment: Bedside commode/3-in-1, CPAP, Walker (specify type), Nebulizer, Oxygen, Dentures (specify type) (front wheeled walker, upper/lower dentures) ADL Screening (condition at time of admission) Patient's cognitive ability adequate to safely complete daily activities?: Yes Is the patient deaf or have difficulty hearing?: No Does the patient have difficulty seeing, even when wearing glasses/contacts?: No Does the patient have difficulty concentrating, remembering, or making decisions?: Yes Patient able to express need for assistance with ADLs?: Yes Does the patient have difficulty dressing or bathing?: Yes Independently performs ADLs?: No Communication: Independent Dressing (OT): Needs assistance Is this a change from baseline?: Change from baseline, expected to last >3 days Grooming: Needs assistance Is this a change from baseline?: Change from baseline, expected to last >3 days Feeding: Needs assistance Is this a change from baseline?: Change from baseline, expected to last >3 days Bathing: Needs assistance Is this a change from baseline?: Change from baseline, expected to last >3 days Toileting: Needs assistance Is this a change from baseline?: Change from baseline, expected to last >3days In/Out Bed:  Needs assistance Is this a change from baseline?: Change from baseline, expected to last >3 days Walks in Home: Needs assistance Is this a change from baseline?: Change from baseline, expected to last >3 days Does the patient have difficulty walking or climbing stairs?: Yes (secondary to weakness and shortness of breath) Weakness of Legs: Both Weakness of  Arms/Hands: Both  Permission Sought/Granted                  Emotional Assessment Appearance:: Appears stated age Attitude/Demeanor/Rapport: Engaged Affect (typically observed): Calm Orientation: : Oriented to Place, Oriented to  Time, Oriented to Self, Oriented to Situation Alcohol / Substance Use: Not Applicable Psych Involvement: No (comment)  Admission diagnosis:  Shortness of breath [R06.02] Pneumonia due to COVID-19 virus [U07.1, J12.82] COVID-19 [U07.1] Patient Active Problem List   Diagnosis Date Noted  . Pneumonia due to COVID-19 virus 09/14/2020  . COPD (chronic obstructive pulmonary disease) (HCC) 03/10/2017  . GERD (gastroesophageal reflux disease) 03/10/2017  . Heartburn 03/10/2017  . Hypertension 03/10/2017  . MI (myocardial infarction) (HCC) 03/10/2017  . Nocturia 03/10/2017  . Mixed Alzheimer's and vascular dementia (HCC) 07/20/2016  . Obesity (BMI 30-39.9) 07/20/2016  . TIA (transient ischemic attack) 07/28/2014  . Obesity 06/20/2013  . Hyperlipidemia with target LDL less than 70 03/22/2013  . Sleep apnea, obstructive 03/22/2013  . Renal artery stenosis (HCC) 03/22/2013  . Renal cyst 03/22/2013  . Coronary atherosclerosis of native coronary artery 03/22/2013  . Dyspnea 08/16/2012   PCP:  Jerl Mina, MD Pharmacy:   [facility] [address], [place] - [address] [place] [address] Phone: [phone] Fax: [phone]     Social Determinants of Health (SDOH) Interventions    Readmission Risk Interventions No flowsheet data found.

## 2020-09-09 NOTE — Progress Notes (Addendum)
NAME:  Daniel Mays, MRN:  [id number], DOB:  May 28, 1941, LOS: 1 ADMISSION DATE:  09/11/2020, CONSULTATION DATE:  12/6 REFERRING MD:  Dr. Wilkie Aye, CHIEF COMPLAINT:  Hypoxic Respiratory Failure   Brief History   79 y/o, unvaccinated, M admitted 12/6 with hypoxemic respiratory failure in the setting of COVID PNA.    He was diagnosed with COVID on 12/3 and became progressively more short of breath. On arrival to the infusion clinic, he was found to have room air saturations of 75%. He was treated with 6L O2 and transferred to [facility] ER for evaluation.  CXR demonstrated low lung volumes, diffuse bilateral infiltrates consistent with COVID PNA. Initial ABG 7.295 / 54.7 / 82.1 / 25.8.  Labs notable for an AKI (BUN 34 / Sr Cr 1.71) and elevated inflammatory markers (D-Dimer 3.70, fibrinogen >800). O2 needs progressed to 12L HFNC with sats of 90-94%.  He was confused on presentation, oriented to self and place. Baseline not oriented to time per spouse.   Past Medical History  CAD s/p stent, CABG HTN  HLD  COPD   Renal Artery Stenosis - identified in 2016, ~ 60% on left, 1-59% on right  DM   OSA - on CPAP   Significant Hospital Events   12/06 Admit with COVID PNA, hypoxic respiratory failure, AKI  12/07 15L [place] O2   Consults:    Procedures:    Significant Diagnostic Tests:    Micro Data:  COVID 12/6 >> positive  Influenza A/B 12/6 >> negative  BCx2 12/6 >>   Antimicrobials:  Baricitinib 12/6 >>   Interim history/subjective:  Requiring 15L [place] O2   RN reports pt wore bipap overnight  Glucose range 137-168  Afebrile  No I/O documented but UOP in drainage bag noted  Objective   Blood pressure (!) 152/80, pulse 68, temperature (!) 97.5 F (36.4 C), temperature source Axillary, resp. rate (!) 22, height 5\' 6"  (1.676 m), weight 98.8 kg, SpO2 92 %.    Vent Mode: BIPAP FiO2 (%):  [60 %] 60 % Set Rate:  [15 bmp-16 bmp] 16 bmp PEEP:  [7 cmH20] 7 cmH20   Intake/Output Summary  (Last 24 hours) at 09/09/2020 0827 Last data filed at 09/26/2020 2200 Gross per 24 hour  Intake 0 ml  Output --  Net 0 ml   Filed Weights   09/20/2020 1551 09/09/20 0436  Weight: 100 kg 98.8 kg    Examination: General: elderly adult male lying in bed in NAD HEENT: MM pink/moist, anicteric, [place] O2 Neuro: Awake, alert, oriented to self. Baseline not oriented to time. Speech clear, appropriate in interaction.  No evidence of delirium. CV: s1s2 rrr, no m/r/g PULM: non-labored on [place], lungs diminished bilaterally  GI: soft, bsx4 active  Extremities: warm/dry, no edema  Skin: no rashes or lesions  Resolved Hospital Problem list     Assessment & Plan:   Acute Hypoxemic Respiratory Failure secondary to COVID PNA with ARDS Severe hypoxic resp failure in setting of COVID PNA.  P/F ratio 102, consistent with ARDS -continue solumedrol 50 mg IV Q12 x2 more days, then transition to PO prednisone  -baricitinib per pharmacy, renal dosing  -prone positioning as able  -incentive spirometry  -follow serial inflammatory markers  -NPO x meds -SLP evaluation  -remains high risk intubation, see discussion below   OSA  COPD without Exacerbation On CPAP at baseline -nocturnal CPAP  -brovana + pulmicort BID  -hold home symbicort as doubt he can adequately inhale it at this point  AKI on CKD  Renal Artery Stenosis  -Trend BMP / urinary output Replace electrolytes as indicated Avoid nephrotoxic agents, ensure adequate renal perfusion  CAD s/p Stents, CABG  HTN  HLD -continue ASA, crestor, plavix, lopressor 50 mg BID -hold home lasix, spironolactone, imdur (unsure if he was actually taking imdur at home)   DM II  Reportedly diet controlled -linagliptin  -SSI, sensitive scale  GERD -PPI (on at home, high dose steroids)  Dementia, questionable acute encephalopathy superimposed due to acute illness -continue aricept  -at risk delirium with steroids -promote sleep / wake cycle  -PT  efforts    Best practice (evaluated daily)  Diet: NPO x meds  Pain/Anxiety/Delirium protocol (if indicated): n/a  VAP protocol (if indicated): n/a  DVT prophylaxis: heparin SQ GI prophylaxis: PPI  Glucose control: SSI, linagliptin  Mobility: As tolerated  Last date of multidisciplinary goals of care discussion: 12/7.   Family and staff present: Wife, son and NP  Summary of discussion: Reviewed current illness, concerns for pulmonary involvement with COVID requiring high O2.  Baseline medical problems place him at high risk for decompensation.  Discussed concept of mechanical ventilation with family.  They have never discussed what he would want in these circumstances.  Family will talk about intubation and we will follow up 12/8.  Son's are also sick with COVID. Encouraged visitation via electronic ICU.  Follow up goals of care discussion due: 12/13 Code Status: Full Code  Disposition: SDU status  Labs   CBC: Recent Labs  Lab 08-Oct-2020 1151 09/09/20 0248  WBC 12.0* 9.9  NEUTROABS 9.9* 8.7*  HGB 14.0 13.5  HCT 45.3 43.7  MCV 97.2 97.3  PLT 268 275    Basic Metabolic Panel: Recent Labs  Lab October 08, 2020 1151 09/09/20 0248  NA 139 140  K 4.4 5.0  CL 102 104  CO2 25 23  GLUCOSE 143* 168*  BUN 34* 42*  CREATININE 1.71* 1.67*  CALCIUM 8.8* 8.7*  MG  --  2.8*  PHOS  --  3.9   GFR: Estimated Creatinine Clearance: 38.2 mL/min (A) (by C-G formula based on SCr of 1.67 mg/dL (H)). Recent Labs  Lab 10/08/2020 1151 10-08-20 1349 09/09/20 0248  PROCALCITON 0.20  --   --   WBC 12.0*  --  9.9  LATICACIDVEN 1.7 1.3  --     Liver Function Tests: Recent Labs  Lab 2020/10/08 1151 09/09/20 0248  AST 39 31  ALT 23 22  ALKPHOS 39 38  BILITOT 0.4 0.8  PROT 8.3* 7.7  ALBUMIN 3.9 3.6   No results for input(s): LIPASE, AMYLASE in the last 168 hours. No results for input(s): AMMONIA in the last 168 hours.  ABG    Component Value Date/Time   PHART 7.295 (L) 08-Oct-2020 1406    PCO2ART 54.7 (H) 10/08/20 1406   PO2ART 82.1 (L) 10-08-2020 1406   HCO3 27.4 Oct 08, 2020 1458   ACIDBASEDEF 0.9 10-08-2020 1458   O2SAT 63.6 10/08/2020 1458     Coagulation Profile: No results for input(s): INR, PROTIME in the last 168 hours.  Cardiac Enzymes: No results for input(s): CKTOTAL, CKMB, CKMBINDEX, TROPONINI in the last 168 hours.  HbA1C: Hgb A1c MFr Bld  Date/Time Value Ref Range Status  07/28/2014 05:00 PM 6.4 (H) <5.7 % Final    Comment:    (NOTE)  According to the ADA Clinical Practice Recommendations for 2011, when HbA1c is used as a screening test:  >=6.5%   Diagnostic of Diabetes Mellitus           (if abnormal result is confirmed) 5.7-6.4%   Increased risk of developing Diabetes Mellitus References:Diagnosis and Classification of Diabetes Mellitus,Diabetes Care,2011,34(Suppl 1):S62-S69 and Standards of Medical Care in         Diabetes - 2011,Diabetes Care,2011,34 (Suppl 1):S11-S61.    CBG: Recent Labs  Lab 09/25/2020 1626 09/11/2020 1713 09/16/2020 1957  GLUCAP 128* 134* 181*     Critical care time:  N/a      [name], MSN, NP-C, AGACNP-BC [facility] 09/09/2020, 8:27 AM   Please see [email or web address] for pager details.

## 2020-09-09 NOTE — Progress Notes (Signed)
Initial Nutrition Assessment  RD working remotely.  DOCUMENTATION CODES:   Obesity unspecified  INTERVENTION:  - diet advancement as medically feasible. - if diet advancement is not possible within the next 48 hours, recommend small bore NGT and initiation of TF. - TF recommendations, if warranted: Vital AF 1.2 @ 25 ml/hr advance by 10 ml every 8 hours to reach goal rate of 55 ml/hr with 45 ml Prosource TF once/day.    NUTRITION DIAGNOSIS:   Increased nutrient needs related to acute illness, catabolic illness (COVID-19 infection) as evidenced by estimated needs  GOAL:   Patient will meet greater than or equal to 90% of their needs  MONITOR:   Diet advancement, Labs, Weight trends  REASON FOR ASSESSMENT:   Consult Assessment of nutrition requirement/status  ASSESSMENT:   79 year-old male with medical history of HTN, heart attack, HLD, CAD, OSA, GERD, DM, arthritis, CKD, and who is hard of hearing. He presented to the [facility] on 12/6 for planned outpatient monoclonal antibody treatment for known COVID infection. He is not vaccinated against COVID. He was diagnosed with COVID on 12/3 and has been progressively more SOB since that time.  Patient has been NPO since admission. He is currently on BiPAP. Able to talk with RN via secure chat and she reports that patient is more awake/alert today and hopeful that he will be able to be off BiPAP in the near future and be able to talk PO meds after that time.   TF recommendations outlined above should they be needed based on medical/clinical course.  Weight today is 218 lb and the most recent weight PTA was on 04/01/20 when he weighed 223 lb.   Per notes: - COVID PNA with ARDS - hx of OSA and COPD without exacerbation - AKI, renal artery stenosis - dementia at baseline with possible acute encephalopathy component    Labs reviewed; BUN: 42 mg/dl, creatinine: 3.87 mg/dl, Ca: 8.7 mg/dl, Mg: 2.8 mg/dl, GFR: 41  ml/min. Medications reviewed; 500 mg ascorbic acid/day, 100 mg colace BID, 1 mg folvite/day, sliding scale novolog, 5 mg tradjenta/day, 50 mg solu-medrol/day, 1 tablet multivitamin with minerals/day, 100 mg thiamine/day, 220 mg zinc sulfate/day.     NUTRITION - FOCUSED PHYSICAL EXAM:  unable to complete at this time.   Diet Order:   Diet Order            Diet NPO time specified Except for: Sips with Meds  Diet effective now                 EDUCATION NEEDS:   Not appropriate for education at this time  Skin:  Skin Assessment: Reviewed RN Assessment  Last BM:  PTA/unknown  Height:   Ht Readings from Last 1 Encounters:  10/01/2020 5\' 6"  (1.676 m)    Weight:   Wt Readings from Last 1 Encounters:  09/09/20 98.8 kg     Estimated Nutritional Needs:  Kcal:  1560-1795 kcal (20-23 kcal/kg adjBW) Protein:  100-110 grams Fluid:  >/= 1.9 L/day      14/07/21, MS, RD, LDN, CNSC Inpatient Clinical Dietitian RD pager # available in AMION  After hours/weekend pager # available in [facility]

## 2020-09-10 LAB — COMPREHENSIVE METABOLIC PANEL
ALT: 24 U/L (ref 0–44)
AST: 30 U/L (ref 15–41)
Albumin: 3.3 g/dL — ABNORMAL LOW (ref 3.5–5.0)
Alkaline Phosphatase: 39 U/L (ref 38–126)
Anion gap: 11 (ref 5–15)
BUN: 53 mg/dL — ABNORMAL HIGH (ref 8–23)
CO2: 26 mmol/L (ref 22–32)
Calcium: 9 mg/dL (ref 8.9–10.3)
Chloride: 103 mmol/L (ref 98–111)
Creatinine, Ser: 1.61 mg/dL — ABNORMAL HIGH (ref 0.61–1.24)
GFR, Estimated: 43 mL/min — ABNORMAL LOW (ref >60)
Glucose, Bld: 208 mg/dL — ABNORMAL HIGH (ref 70–99)
Potassium: 4.4 mmol/L (ref 3.5–5.1)
Sodium: 140 mmol/L (ref 135–145)
Total Bilirubin: 0.4 mg/dL (ref 0.3–1.2)
Total Protein: 7.4 g/dL (ref 6.5–8.1)

## 2020-09-10 LAB — CBC WITH DIFFERENTIAL/PLATELET
Abs Immature Granulocytes: 0.17 10*3/uL — ABNORMAL HIGH (ref 0.00–0.07)
Basophils Absolute: 0 10*3/uL (ref 0.0–0.1)
Basophils Relative: 0 %
Eosinophils Absolute: 0 10*3/uL (ref 0.0–0.5)
Eosinophils Relative: 0 %
HCT: 43.3 % (ref 39.0–52.0)
Hemoglobin: 13.8 g/dL (ref 13.0–17.0)
Immature Granulocytes: 1 %
Lymphocytes Relative: 5 %
Lymphs Abs: 0.9 10*3/uL (ref 0.7–4.0)
MCH: 30.1 pg (ref 26.0–34.0)
MCHC: 31.9 g/dL (ref 30.0–36.0)
MCV: 94.3 fL (ref 80.0–100.0)
Monocytes Absolute: 1.5 10*3/uL — ABNORMAL HIGH (ref 0.1–1.0)
Monocytes Relative: 8 %
Neutro Abs: 15.6 10*3/uL — ABNORMAL HIGH (ref 1.7–7.7)
Neutrophils Relative %: 86 %
Platelets: 321 10*3/uL (ref 150–400)
RBC: 4.59 MIL/uL (ref 4.22–5.81)
RDW: 12.6 % (ref 11.5–15.5)
WBC: 18.3 10*3/uL — ABNORMAL HIGH (ref 4.0–10.5)
nRBC: 0 % (ref 0.0–0.2)

## 2020-09-10 LAB — GLUCOSE, CAPILLARY
Glucose-Capillary: 165 mg/dL — ABNORMAL HIGH (ref 70–99)
Glucose-Capillary: 160 mg/dL — ABNORMAL HIGH (ref 70–99)
Glucose-Capillary: 164 mg/dL — ABNORMAL HIGH (ref 70–99)
Glucose-Capillary: 199 mg/dL — ABNORMAL HIGH (ref 70–99)
Glucose-Capillary: 213 mg/dL — ABNORMAL HIGH (ref 70–99)

## 2020-09-10 LAB — D-DIMER, QUANTITATIVE: D-Dimer, Quant: 2.97 ug/mL-FEU — ABNORMAL HIGH (ref 0.00–0.50)

## 2020-09-10 LAB — MAGNESIUM: Magnesium: 2.7 mg/dL — ABNORMAL HIGH (ref 1.7–2.4)

## 2020-09-10 LAB — PHOSPHORUS: Phosphorus: 2.6 mg/dL (ref 2.5–4.6)

## 2020-09-10 LAB — FERRITIN: Ferritin: 167 ng/mL (ref 24–336)

## 2020-09-10 LAB — C-REACTIVE PROTEIN: CRP: 11.9 mg/dL — ABNORMAL HIGH (ref <1.0)

## 2020-09-10 MED ORDER — LACTATED RINGERS IV BOLUS
500.0000 mL | Freq: Once | INTRAVENOUS | Status: AC
  Administered 2020-09-10: 500 mL via INTRAVENOUS

## 2020-09-10 MED ORDER — LORAZEPAM 2 MG/ML IJ SOLN
0.5000 mg | Freq: Once | INTRAMUSCULAR | Status: AC
  Administered 2020-09-10: 0.5 mg via INTRAVENOUS
  Filled 2020-09-10: qty 1

## 2020-09-10 NOTE — Progress Notes (Signed)
Spoke with Daughter in [place] via phone Daniel Mays [phone]) at her request.  She was concerned that her mother-in-law may not be understanding the situation.  We reviewed the patients hospitalization, O2 needs increase over the last few days, mild delirium and concern for aspiration with SLP evaluation.  We discussed that Daniel Mays is not a well man on a normal day with his OSA, DM, HLD, GERD, CKD / renal artery stenosis, and CAD.  We reviewed that all aggressive measures are in place with the hope to restore his health. However, if he were to decline, I recommended no CPR and no intubation.  She states "I think he is a DNR".  I asked her to review with the family and we will touch base again in the AM regarding plan of care.  She asked to allow the son's to visit.  We reviewed the visitation policy.  Will follow up in am.    [name], MSN, NP-C, AGACNP-BC [place] 09/10/2020, 3:41 PM   Please see [email or web address] for pager details.

## 2020-09-10 NOTE — Progress Notes (Signed)
Spoke with Daughter in [place] via phone [facility] Marney [phone]) we discussed Daniel Mays's code status and his power of attorney. The patient in his Living Will stats that he wants CPR but does not want to be kept alive by life support. Power of attorney is his oldest son Daniel Mays. Patients family will bring in copies of these forms tomorrow. Dr. Chestine Spore made aware of the patients code status.

## 2020-09-10 NOTE — Plan of Care (Signed)
  Problem: Activity: Goal: Risk for activity intolerance will decrease Outcome: Progressing   Problem: Nutrition: Goal: Adequate nutrition will be maintained Outcome: Progressing   Problem: Coping: Goal: Level of anxiety will decrease Outcome: Progressing   Problem: Safety: Goal: Ability to remain free from injury will improve Outcome: Progressing   

## 2020-09-10 NOTE — Evaluation (Signed)
Physical Therapy Evaluation Patient Details Name: Daniel Mays MRN: [id number] DOB: Nov 25, 1940 Today's Date: 09/10/2020   History of Present Illness  79 year old history of dementia, CAD with stent, history of CABG, COPD, renal artery stenosis, DM, HTN, HLD admitted to the hospital with COVID-19. He was initially diagnosed on 12/3, had worsening symptoms and eventually came to the hospital and was admitted on 12/6 with hypoxic respiratory failure in the setting of COVID-19 pneumonia  Clinical Impression  Pt admitted with above diagnosis.  Pt currently with functional limitations due to the deficits listed below (see PT Problem List). Pt will benefit from skilled PT to increase their independence and safety with mobility to allow discharge to the venue listed below.   Pt reports not feeling well however able to assist pt from bed to recliner. Pt reports he uses a standard walker at home.  Pt's SpO2 dropped to 80% on 15L HFNC with transfer.  Pt will likely be able to d/c home if he continues to mobilize in room this admission however pt is a high falls risk so recommend 24/7 supervision upon d/c.     Follow Up Recommendations Supervision/Assistance - 24 hour;Home health PT (would likely perform better in home environment if pt has 24/7 supervision due to high fall risk)    Equipment Recommendations  Rolling walker with 5" wheels    Recommendations for Other Services       Precautions / Restrictions Precautions Precautions: Fall Precaution Comments: currently on 15L HFNC  SpO2 91-92% on 15L HFNC at rest    Mobility  Bed Mobility Overal bed mobility: Needs Assistance Bed Mobility: Supine to Sit     Supine to sit: Mod assist     General bed mobility comments: assist for trunk upright and scooting to EOB, pt assisting more once aware of task (very HOH)    Transfers Overall transfer level: Needs assistance Equipment used: 2 person hand held assist Transfers: Sit to/from  [employer] Sit to Stand: Min guard Stand pivot transfers: Min guard       General transfer comment: min/guard for safety and lines; SpO2 dropped to 80% on 15L HFNC; pt reports moderate dyspnea, cued for breathing in through nose  Ambulation/Gait                Stairs            Wheelchair Mobility    Modified Rankin (Stroke Patients Only)       Balance Overall balance assessment: Needs assistance         Standing balance support: Bilateral upper extremity supported Standing balance-Leahy Scale: Poor Standing balance comment: reliant on UE support                             Pertinent Vitals/Pain Pain Assessment: No/denies pain    Home Living Family/patient expects to be discharged to:: Private residence Living Arrangements: Spouse/significant other Available Help at Discharge: Family Type of Home: House       Home Layout: One level Home Equipment: Bedside commode;Walker - standard Additional Comments: per pt report, uncertain of accuracy - very HOH and hx of dementia    Prior Function Level of Independence: Independent with assistive device(s)               Hand Dominance        Extremity/Trunk Assessment        Lower Extremity Assessment Lower Extremity Assessment: Generalized weakness  Communication   Communication: HOH  Cognition Arousal/Alertness: Awake/alert Behavior During Therapy: Flat affect Overall Cognitive Status: History of cognitive impairments - at baseline                                 General Comments: follows simple commands, cognition likely baseline per RN      General Comments      Exercises     Assessment/Plan    PT Assessment Patient needs continued PT services  PT Problem List Cardiopulmonary status limiting activity;Decreased balance;Decreased mobility;Decreased safety awareness;Decreased knowledge of use of DME       PT Treatment  Interventions Gait training;DME instruction;Therapeutic exercise;Balance training;Functional mobility training;Therapeutic activities;Patient/family education    PT Goals (Current goals can be found in the Care Plan section)  Acute Rehab PT Goals PT Goal Formulation: Patient unable to participate in goal setting Time For Goal Achievement: 09/24/20 Potential to Achieve Goals: Good    Frequency Min 3X/week   Barriers to discharge        Co-evaluation               AM-PAC PT "6 Clicks" Mobility  Outcome Measure Help needed turning from your back to your side while in a flat bed without using bedrails?: A Little Help needed moving from lying on your back to sitting on the side of a flat bed without using bedrails?: A Lot Help needed moving to and from a bed to a chair (including a wheelchair)?: A Little Help needed standing up from a chair using your arms (e.g., wheelchair or bedside chair)?: A Little Help needed to walk in hospital room?: A Little Help needed climbing 3-5 steps with a railing? : A Lot 6 Click Score: 16    End of Session Equipment Utilized During Treatment: Gait belt;Oxygen Activity Tolerance: Patient tolerated treatment well Patient left: in chair;with call bell/phone within reach (on chair alarm pad however no connnected to box, as pt attempted to get up however OT entering room and alerted to plug in alarm, floor mat in front of recliner) Nurse Communication: Mobility status PT Visit Diagnosis: Difficulty in walking, not elsewhere classified (R26.2)    Time: 1224-8250 PT Time Calculation (min) (ACUTE ONLY): 23 min   Charges:   PT Evaluation $PT Eval Moderate Complexity: 1 Mod        Kati PT, DPT Acute Rehabilitation Services Pager: [phone] Office: [phone]  Maida Sale E 09/10/2020, 1:25 PM

## 2020-09-10 NOTE — Care Plan (Signed)
Family called back after speaking to NP Veleta Miners this afternoon regarding goals of care and spoke to the patient's RN Eliberto Ivory. They will bring his POA paperwork tomorrow, but they indicated he would not want to be intubated but would be ok with CPR in the event of a cardiac arrest. Order updated in epic.  Steffanie Dunn, DO 09/10/20 5:08 PM [facility]

## 2020-09-10 NOTE — Progress Notes (Signed)
PROGRESS NOTE  Daniel Mays GMW:[id number] DOB: 1941-02-13 DOA: 2020-09-10 PCP: Jerl Mina, MD   LOS: 2 days   Brief Narrative / Interim history: 79 year old history of dementia, CAD with stent, history of CABG, COPD, renal artery stenosis, DM, HTN, HLD admitted to the hospital with COVID-19. He was initially diagnosed on 12/3, had worsening symptoms and eventually came to the hospital and was admitted on 12/6 with hypoxic respiratory failure in the setting of COVID-19 pneumonia.  Subjective / 24h Interval events: Seems confused this morning  Assessment & Plan:  Principal Problem Acute Hypoxic Respiratory Failure due to Covid-19 Viral Illness -Initially made to the ICU, transferred to the hospitalist service 12/8 -Patient remains profoundly hypoxic this morning, still requiring high flow nasal cannula, 15 L -Started on steroids, baricitinib on 12/6. Continue. -Not started on remdesivir due to questionable benefit  Vent Mode: BIPAP FiO2 (%):  [60 %] 60 % Set Rate:  [16 bmp] 16 bmp PEEP:  [7 cmH20] 7 cmH20    COVID-19 Labs  Recent Labs    09/10/20 1151 09/09/20 0248 09/10/20 0252  DDIMER 3.70* 3.13* 2.97*  FERRITIN 114 134 167  LDH 334*  --   --   CRP 23.9* 19.8* 11.9*    Lab Results  Component Value Date   SARSCOV2NAA POSITIVE (A) September 10, 2020   Active Problems OSA, history of COPD -Continue BiPAP nightly, continue steroids, no wheezing  Acute kidney injury on chronic kidney disease stage IIIb -With underlying renal artery stenosis. Monitor renal function. Creatinine appears to be at his baseline  CAD with history of stents, CABG, HTN, HLD -Continue aspirin, Plavix, statin, metoprolol  Dementia -Continue home Aricept  DM2 -Continue sliding scale, linagliptin  CBG (last 3)  Recent Labs    09/10/20 0056 09/10/20 0437 09/10/20 0747  GLUCAP 213* 165* 160*    Scheduled Meds: . vitamin C  500 mg Oral Daily  . aspirin EC  81 mg Oral Daily  .  baricitinib  2 mg Oral Daily  . chlorhexidine  15 mL Mouth Rinse BID  . Chlorhexidine Gluconate Cloth  6 each Topical Daily  . clopidogrel  75 mg Oral Daily  . docusate sodium  100 mg Oral BID  . donepezil  10 mg Oral QHS  . folic acid  1 mg Oral Daily  . heparin  5,000 Units Subcutaneous Q8H  . insulin aspart  0-9 Units Subcutaneous Q4H  . linagliptin  5 mg Oral Daily  . mouth rinse  15 mL Mouth Rinse BID  . mouth rinse  15 mL Mouth Rinse q12n4p  . methylPREDNISolone (SOLU-MEDROL) injection  0.5 mg/kg Intravenous Q12H   Followed by  . [START ON 09/11/2020] predniSONE  50 mg Oral Daily  . metoprolol tartrate  50 mg Oral BID  . multivitamin with minerals  1 tablet Oral Daily  . pantoprazole  40 mg Oral Daily  . rosuvastatin  20 mg Oral Daily  . thiamine  100 mg Oral Daily  . zinc sulfate  220 mg Oral Daily   Continuous Infusions: PRN Meds:.acetaminophen, chlorpheniramine-HYDROcodone, guaiFENesin-dextromethorphan, ondansetron **OR** ondansetron (ZOFRAN) IV, Resource ThickenUp Clear  DVT prophylaxis: heparin Code Status: Full code Family Communication: no family at bedside    Status is: Inpatient  Remains inpatient appropriate because:Inpatient level of care appropriate due to severity of illness   Dispo: The patient is from: Home              Anticipated d/c is to: Home  Anticipated d/c date is: > 3 days              Patient currently is not medically stable to d/c.   Consultants:  PCCM  Procedures:  None   Microbiology: None   Antibacterials: None    Objective: Vitals:   09/10/20 0900 09/10/20 1000 09/10/20 1025 09/10/20 1100  BP: (!) 154/67 (!) 159/70 (!) 175/95 (!) 164/86  Pulse: 64 75 68 63  Resp: (!) 23 17  (!) 27  Temp:      TempSrc:      SpO2: (!) 89% (!) 86%  (!) 85%  Weight:      Height:        Intake/Output Summary (Last 24 hours) at 09/10/2020 1230 Last data filed at 09/10/2020 0100 Gross per 24 hour  Intake 480 ml  Output 725 ml   Net -245 ml   Filed Weights   10/03/20 1551 09/09/20 0436 09/10/20 0500  Weight: 100 kg 98.8 kg 100.6 kg    Examination:  Constitutional: In bed, appears confused, trying to get out Eyes: no scleral icterus ENMT: Mucous membranes are moist.  Neck: normal, supple Respiratory: Shallow respirations, diffuse rhonchi at the bases but no wheezing Cardiovascular: Regular rate and rhythm, no murmurs / rubs / gallops. Abdomen: non distended, no tenderness. Bowel sounds positive.  Musculoskeletal: no clubbing / cyanosis.  Skin: no rashes Neurologic: Does not follow commands consistently but exam is nonfocal, moves all 4 extremities independently  Data Reviewed: I have independently reviewed following labs and imaging studies   CBC: Recent Labs  Lab 2020-10-03 1151 09/09/20 0248 09/10/20 0252  WBC 12.0* 9.9 18.3*  NEUTROABS 9.9* 8.7* 15.6*  HGB 14.0 13.5 13.8  HCT 45.3 43.7 43.3  MCV 97.2 97.3 94.3  PLT 268 275 321   Basic Metabolic Panel: Recent Labs  Lab 10/03/20 1151 09/09/20 0248 09/10/20 0252  NA 139 140 140  K 4.4 5.0 4.4  CL 102 104 103  CO2 25 23 26   GLUCOSE 143* 168* 208*  BUN 34* 42* 53*  CREATININE 1.71* 1.67* 1.61*  CALCIUM 8.8* 8.7* 9.0  MG  --  2.8* 2.7*  PHOS  --  3.9 2.6   GFR: Estimated Creatinine Clearance: 40 mL/min (A) (by C-G formula based on SCr of 1.61 mg/dL (H)). Liver Function Tests: Recent Labs  Lab 10-03-2020 1151 09/09/20 0248 09/10/20 0252  AST 39 31 30  ALT 23 22 24   ALKPHOS 39 38 39  BILITOT 0.4 0.8 0.4  PROT 8.3* 7.7 7.4  ALBUMIN 3.9 3.6 3.3*   No results for input(s): LIPASE, AMYLASE in the last 168 hours. No results for input(s): AMMONIA in the last 168 hours. Coagulation Profile: No results for input(s): INR, PROTIME in the last 168 hours. Cardiac Enzymes: No results for input(s): CKTOTAL, CKMB, CKMBINDEX, TROPONINI in the last 168 hours. BNP (last 3 results) No results for input(s): PROBNP in the last 8760  hours. HbA1C: No results for input(s): HGBA1C in the last 72 hours. CBG: Recent Labs  Lab 09/09/20 1515 09/09/20 1938 09/10/20 0056 09/10/20 0437 09/10/20 0747  GLUCAP 164* 199* 213* 165* 160*   Lipid Profile: Recent Labs    Oct 03, 2020 1151  TRIG 170*   Thyroid Function Tests: No results for input(s): TSH, T4TOTAL, FREET4, T3FREE, THYROIDAB in the last 72 hours. Anemia Panel: Recent Labs    09/09/20 0248 09/10/20 0252  FERRITIN 134 167   Urine analysis: No results found for: COLORURINE, APPEARANCEUR, LABSPEC, PHURINE, GLUCOSEU, HGBUR,  BILIRUBINUR, KETONESUR, PROTEINUR, UROBILINOGEN, NITRITE, LEUKOCYTESUR Sepsis Labs: Invalid input(s): PROCALCITONIN, LACTICIDVEN  Recent Results (from the past 240 hour(s))  Resp Panel by RT-PCR (Flu A&B, Covid) Nasopharyngeal Swab     Status: Abnormal   Collection Time: 2020-09-29 11:50 AM   Specimen: Nasopharyngeal Swab; Nasopharyngeal(NP) swabs in vial transport medium  Result Value Ref Range Status   SARS Coronavirus 2 by RT PCR POSITIVE (A) NEGATIVE Final    Comment: RESULT CALLED TO, READ BACK BY AND VERIFIED WITH: JACOBS,D. RN @1332  ON 29-Sep-2020 BY NMCCOY (NOTE) SARS-CoV-2 target nucleic acids are DETECTED.  The SARS-CoV-2 RNA is generally detectable in upper respiratory specimens during the acute phase of infection. Positive results are indicative of the presence of the identified virus, but do not rule out bacterial infection or co-infection with other pathogens not detected by the test. Clinical correlation with patient history and other diagnostic information is necessary to determine patient infection status. The expected result is Negative.  Fact Sheet for Patients: 14.6.2021  Fact Sheet for Healthcare Providers: BloggerCourse.com  This test is not yet approved or cleared by the [employer]iousBroker.it FDA and  has been authorized for detection and/or diagnosis of  SARS-CoV-2 by FDA under an Emergency Use Authorization (EUA).  This EUA will remain in effect (meaning this tes t can be used) for the duration of  the COVID-19 declaration under Section 564(b)(1) of the Act, 21 U.S.C. section 360bbb-3(b)(1), unless the authorization is terminated or revoked sooner.     Influenza A by PCR NEGATIVE NEGATIVE Final   Influenza B by PCR NEGATIVE NEGATIVE Final    Comment: (NOTE) The Xpert Xpress SARS-CoV-2/FLU/RSV plus assay is intended as an aid in the diagnosis of influenza from Nasopharyngeal swab specimens and should not be used as a sole basis for treatment. Nasal washings and aspirates are unacceptable for Xpert Xpress SARS-CoV-2/FLU/RSV testing.  Fact Sheet for Patients: [place]  Fact Sheet for Healthcare Providers: BloggerCourse.com  This test is not yet approved or cleared by the [employer]iousBroker.it FDA and has been authorized for detection and/or diagnosis of SARS-CoV-2 by FDA under an Emergency Use Authorization (EUA). This EUA will remain in effect (meaning this test can be used) for the duration of the COVID-19 declaration under Section 564(b)(1) of the Act, 21 U.S.C. section 360bbb-3(b)(1), unless the authorization is terminated or revoked.  Performed at [facility], [address] [place], [place]   Blood Culture (routine x 2)     Status: None (Preliminary result)   Collection Time: 2020-09-29 11:51 AM   Specimen: BLOOD  Result Value Ref Range Status   Specimen Description   Final    BLOOD RIGHT ANTECUBITAL Performed at [facility], [address], [place], [place]    Special Requests   Final    BOTTLES DRAWN AEROBIC AND ANAEROBIC Blood Culture adequate volume Performed at [facility], [address] [place], [place]    Culture   Final    NO GROWTH 2 DAYS Performed at [facility], [address] [place], [place]    Report Status PENDING  Incomplete  Blood Culture (routine x 2)     Status: None (Preliminary result)   Collection Time: 29-Sep-2020 11:51 AM   Specimen: BLOOD  Result Value Ref Range Status   Specimen Description   Final    BLOOD RIGHT WRIST Performed at [facility], [address] [place], [place]  Special Requests   Final    BOTTLES DRAWN AEROBIC AND ANAEROBIC Blood Culture adequate volume Performed at [facility], [address] [place], [place] [address]    Culture   Final    NO GROWTH 2 DAYS Performed at [facility], [address], [place], [place] [address]    Report Status PENDING  Incomplete  MRSA PCR Screening     Status: None   Collection Time: 22-Sep-2020  5:13 PM   Specimen: Nasopharyngeal  Result Value Ref Range Status   MRSA by PCR NEGATIVE NEGATIVE Final    Comment:        The GeneXpert MRSA Assay (FDA approved for NASAL specimens only), is one component of a comprehensive MRSA colonization surveillance program. It is not intended to diagnose MRSA infection nor to guide or monitor treatment for MRSA infections. Performed at [facility], [address], [place], [place] [address]       Radiology Studies: Portable chest 1 View  Result Date: 09/09/2020 CLINICAL DATA:  Shortness of breath. EXAM: PORTABLE CHEST 1 VIEW COMPARISON:  September 22, 2020. FINDINGS: Prior CABG. Heart size stable. No pulmonary venous congestion. Bilateral multifocal atelectasis/infiltrates again noted. Slight improvement on aeration from prior exam. No pleural effusion or pneumothorax. No acute bony abnormality identified. IMPRESSION: 1. Bilateral multifocal atelectatic changes and infiltrates again noted with slight improvement in aeration from prior exam. 2.  Prior CABG.  Heart size stable.  No pulmonary venous congestion. Electronically Signed   By: Maisie Fus   Register   On: 09/09/2020 06:08   Pamella Pert, MD, PhD [facility]  Between 7 am - 7 pm I am available, please contact me via Amion or Securechat  Between 7 pm - 7 am I am not available, please contact night coverage MD/APP via Amion

## 2020-09-10 NOTE — Progress Notes (Addendum)
NAME:  Daniel Mays, MRN:  [id number], DOB:  1940/11/27, LOS: 2 ADMISSION DATE:  09/07/2020, CONSULTATION DATE:  12/6 REFERRING MD:  Dr. Wilkie Aye, CHIEF COMPLAINT:  Hypoxic Respiratory Failure   Brief History   79 y/o, unvaccinated, M admitted 12/6 with hypoxemic respiratory failure in the setting of COVID PNA.    He was diagnosed with COVID on 12/3 and became progressively more short of breath. On arrival to the infusion clinic, he was found to have room air saturations of 75%. He was treated with 6L O2 and transferred to [facility] ER for evaluation.  CXR demonstrated low lung volumes, diffuse bilateral infiltrates consistent with COVID PNA. Initial ABG 7.295 / 54.7 / 82.1 / 25.8.  Labs notable for an AKI (BUN 34 / Sr Cr 1.71) and elevated inflammatory markers (D-Dimer 3.70, fibrinogen >800). O2 needs progressed to 12L HFNC with sats of 90-94%.  He was confused on presentation, oriented to self and place. Baseline not oriented to time per spouse.   Past Medical History  CAD s/p stent, CABG HTN  HLD  COPD   Renal Artery Stenosis - identified in 2016, ~ 60% on left, 1-59% on right  DM   OSA - on CPAP   Significant Hospital Events   12/06 Admit with COVID PNA, hypoxic respiratory failure, AKI  12/07 15L [place] O2, pt pulling O2 off frequently 12/08 Remains on 15L [place]    Consults:    Procedures:    Significant Diagnostic Tests:    Micro Data:  COVID 12/6 >> positive  Influenza A/B 12/6 >> negative  BCx2 12/6 >>   Antimicrobials:  Baricitinib 12/6 >>   Interim history/subjective:  Remains on 15L [place], pulls O2 off frequently  Glucose range 137-208 Afebrile / WBC increased to 18.3 (on steroids) I/O UOP, -245 in last 24 hours  Objective   Blood pressure (!) 167/71, pulse 66, temperature (P) 97.9 F (36.6 C), temperature source (P) Axillary, resp. rate (!) 22, height 5\' 6"  (1.676 m), weight 100.6 kg, SpO2 (!) 89 %.    Vent Mode: BIPAP FiO2 (%):  [60 %] 60 % Set  Rate:  [16 bmp] 16 bmp PEEP:  [7 cmH20] 7 cmH20   Intake/Output Summary (Last 24 hours) at 09/10/2020 0819 Last data filed at 09/10/2020 0100 Gross per 24 hour  Intake 480 ml  Output 725 ml  Net -245 ml   Filed Weights   09/20/2020 1551 09/09/20 0436 09/10/20 0500  Weight: 100 kg 98.8 kg 100.6 kg    Examination: General: adult male lying in bed in NAD HEENT: MM pink/moist, anicteric, [place] O2 Neuro: Awake / alert, oriented to self, very HOH, speech clear, MAE CV: s1s2 rrr, no m/r/g PULM: non-labored on [place] O2, lungs bilaterally diminished  GI: soft, bsx4 active  Extremities: warm/dry, no edema  Skin: no rashes or lesions  Resolved Hospital Problem list     Assessment & Plan:   Acute Hypoxemic Respiratory Failure secondary to COVID PNA with ARDS Severe hypoxic resp failure in setting of COVID PNA.  P/F ratio 102, consistent with ARDS -solumedrol 50 mg IV BID through 12/8, then PO prednisone  -baricitinib per pharmacy  -prone positioning as able  -incentive spirometry  -nectar thick liquids as tolerated -appreciate SLP evaluation  -follow for intubation risk  -monitor intermittent CXR   OSA  COPD without Exacerbation On CPAP at baseline -BiPAP QHS for now  -Brovana + Pulmicort BID  -hold home symbicort for now   AKI on CKD  Renal Artery Stenosis  -Trend BMP / urinary output -Replace electrolytes as indicated -Avoid nephrotoxic agents, ensure adequate renal perfusion  CAD s/p Stents, CABG  HTN  HLD -continue ASA, crestor, plavix, lopressor  -hold home lasix, spironolatone, imdur (not sure he was actually taking imdur at home based on med review)  DM II  Reportedly diet controlled -continue linagliptin -SSI, sensitive scale   GERD -PPI BID, on at home and on high dose steroids   Dementia, questionable acute encephalopathy superimposed due to acute illness -continue aricept  -at risk delirum with steroids / hypoxia  -promote sleep / wake cycle  -PT efforts  as able  GOC -consider palliative care involvement  Best practice (evaluated daily)  Diet: NPO x meds  Pain/Anxiety/Delirium protocol (if indicated): n/a  VAP protocol (if indicated): n/a  DVT prophylaxis: heparin SQ GI prophylaxis: PPI  Glucose control: SSI, linagliptin  Mobility: As tolerated  Last date of multidisciplinary goals of care discussion: 12/8  Family and staff present: Wife and NP  Summary of discussion: Updated wife via phone 12/8, discussed prior conversation regarding mechanical ventilation / concept of life support in the setting of COVID.  They indicate they "want everything done" to keep him here.  They are concerned about him dying alone.  Reviewed with them that mechanical ventilation alone does not ensure he will survive.   Follow up goals of care discussion due: 12/13 Code Status: Full Code  Disposition: SDU status, PCCM will follow peripherally.   Labs   CBC: Recent Labs  Lab 09/17/2020 1151 09/09/20 0248 09/10/20 0252  WBC 12.0* 9.9 18.3*  NEUTROABS 9.9* 8.7* 15.6*  HGB 14.0 13.5 13.8  HCT 45.3 43.7 43.3  MCV 97.2 97.3 94.3  PLT 268 275 321    Basic Metabolic Panel: Recent Labs  Lab 10/02/2020 1151 09/09/20 0248 09/10/20 0252  NA 139 140 140  K 4.4 5.0 4.4  CL 102 104 103  CO2 25 23 26   GLUCOSE 143* 168* 208*  BUN 34* 42* 53*  CREATININE 1.71* 1.67* 1.61*  CALCIUM 8.8* 8.7* 9.0  MG  --  2.8* 2.7*  PHOS  --  3.9 2.6   GFR: Estimated Creatinine Clearance: 40 mL/min (A) (by C-G formula based on SCr of 1.61 mg/dL (H)). Recent Labs  Lab 09/25/2020 1151 09/19/2020 1349 09/09/20 0248 09/10/20 0252  PROCALCITON 0.20  --   --   --   WBC 12.0*  --  9.9 18.3*  LATICACIDVEN 1.7 1.3  --   --     Liver Function Tests: Recent Labs  Lab 09/22/2020 1151 09/09/20 0248 09/10/20 0252  AST 39 31 30  ALT 23 22 24   ALKPHOS 39 38 39  BILITOT 0.4 0.8 0.4  PROT 8.3* 7.7 7.4  ALBUMIN 3.9 3.6 3.3*   No results for input(s): LIPASE, AMYLASE in the last  168 hours. No results for input(s): AMMONIA in the last 168 hours.  ABG    Component Value Date/Time   PHART 7.295 (L) 09/05/2020 1406   PCO2ART 54.7 (H) 09/21/2020 1406   PO2ART 82.1 (L) 09/26/2020 1406   HCO3 27.4 09/20/2020 1458   ACIDBASEDEF 0.9 09/05/2020 1458   O2SAT 63.6 09/14/2020 1458     Coagulation Profile: No results for input(s): INR, PROTIME in the last 168 hours.  Cardiac Enzymes: No results for input(s): CKTOTAL, CKMB, CKMBINDEX, TROPONINI in the last 168 hours.  HbA1C: Hgb A1c MFr Bld  Date/Time Value Ref Range Status  07/28/2014 05:00 PM 6.4 (H) <5.7 %  Final    Comment:    (NOTE)                                                                       According to the ADA Clinical Practice Recommendations for 2011, when HbA1c is used as a screening test:  >=6.5%   Diagnostic of Diabetes Mellitus           (if abnormal result is confirmed) 5.7-6.4%   Increased risk of developing Diabetes Mellitus References:Diagnosis and Classification of Diabetes Mellitus,Diabetes Care,2011,34(Suppl 1):S62-S69 and Standards of Medical Care in         Diabetes - 2011,Diabetes Care,2011,34 (Suppl 1):S11-S61.    CBG: Recent Labs  Lab 09/12/2020 1957 09/09/20 0033 09/09/20 0430 09/09/20 0808 09/09/20 1154  GLUCAP 181* 166* 137* 168* 150*     Critical care time:  N/a      [name], MSN, NP-C, AGACNP-BC [facility] 09/10/2020, 8:19 AM   Please see [email or web address] for pager details.

## 2020-09-10 NOTE — Progress Notes (Signed)
Patient is agitated tonight. He is currently restrained with an oxygen mask on. However, he declines the use of BiPAP. RN is aware.

## 2020-09-10 NOTE — Progress Notes (Signed)
  Speech Language Pathology Treatment: Dysphagia  Patient Details Name: Daniel Mays MRN: [id number] DOB: 1941-04-01 Today's Date: 09/10/2020 Time: 8250-5397 SLP Time Calculation (min) (ACUTE ONLY): 40 min  Assessment / Plan / Recommendation Clinical Impression  SLP visit to assure tolerance of po diet with modifications created to mitigate risk.  RN needed to give pt po medications and SLP assisted with this task. Minimal delayed swallow with puree with pills followed by nectar liquids. Subtle cough x1 of approximately 8 swallows of nectar.  Pt took approximately 7 boluses of applesauce and was noted to appear to become mildly dyspneic as intake continued - to which he confirmed with direct question.  Pt without coughing when consuming small single cup boluses of nectar but he requires total cues to take small single boluses. When he was allowed to consume liquids Independently via cup, he consumed multiple sequential boluses which resulted overt coughing and slight oxygen desaturation.  Pt admits to coughing at times at home, stating "It will choke you".  Pt initially stated thickened liquids was easier to swallow but later denied this despite clinically thicker liquid results in less coughing. Due to pt becoming dyspneic with intake, recommend small frequent meals and maximize liquid nutrition.  Will follow up for readiness for dietary advancement or ability for pt to participate in instrumental evaluation.  Advised pt to take "baby sips" using teach back for reasoning.  Note WBC elevation from 9 to 18.   HPI HPI: COVID dx 12/3, pna, h/o dementia, prior smoker, COPD, CAD s/p CABG, HLD, HTN, ? 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  CXR showed diffuse infiltrates consistent with COVID pna.  No prior swallow evaluations in system.      SLP Plan  Continue with current plan of care       Recommendations  Diet recommendations: Nectar-thick  liquid (full liquid) Liquids provided via: Cup;Teaspoon;No straw Medication Administration: Whole meds with puree (follow with nectar) Compensations: Slow rate;Small sips/bites Postural Changes and/or Swallow Maneuvers: Seated upright 90 degrees;Upright 30-60 min after meal                Oral Care Recommendations: Oral care BID Follow up Recommendations: None SLP Visit Diagnosis: Dysphagia, unspecified (R13.10) Plan: Continue with current plan of care       GO                Chales Abrahams 09/10/2020, 11:54 AM  Rolena Infante, MS [facility] SLP Acute Rehab Services Office [phone] Pager [phone]

## 2020-09-10 NOTE — Progress Notes (Signed)
Occupational Therapy Evaluation  PLOF per PT notes, patient has dementia at baseline. Patient stated mod I with ADLs and use of walker for mobility. Patient disoriented to place, situation and is very HOH. Patient currently requiring min A with sit to stand from recliner, exhibiting decreased activity tolerance and desaturation to 82% on 15L HFNC. Instructed patient in PLB techniques however difficulty with carry over/communication due to [employer]. Patient set up to min A for UB and mod A for LB ADLs. If family can provide current level of assist would recommend [facility] + 24/7 supervision + assistance, acute OT will continue to follow to maximize patient safety and independence with self care.    09/10/20 1400  OT Visit Information  Last OT Received On 09/10/20  Assistance Needed +2 (helpful for lines)  History of Present Illness 79 year old history of dementia, CAD with stent, history of CABG, COPD, renal artery stenosis, DM, HTN, HLD admitted to the hospital with COVID-19. He was initially diagnosed on 12/3, had worsening symptoms and eventually came to the hospital and was admitted on 12/6 with hypoxic respiratory failure in the setting of COVID-19 pneumonia  Precautions  Precautions Fall  Precaution Comments currently on 15L HFNC  Restrictions  Weight Bearing Restrictions No  Home Living  Family/patient expects to be discharged to: Private residence  Living Arrangements Spouse/significant other  Available Help at Discharge Family  Type of Home House  Home Layout One level  Home Equipment BSC;Walker - standard  Additional Comments per pt report, uncertain of accuracy - very HOH and hx of dementia  Prior Function  Level of Independence Independent with assistive device(s)  Communication  Communication HOH  Pain Assessment  Pain Assessment Faces  Faces Pain Scale 0  Cognition  Arousal/Alertness Awake/alert  Behavior During Therapy Flat affect  Overall Cognitive Status History of cognitive  impairments - at baseline  General Comments upon arrival to room patient restless in the chair, keeps stating where are the wheels, it won't move  Upper Extremity Assessment  Upper Extremity Assessment Generalized weakness  Lower Extremity Assessment  Lower Extremity Assessment Defer to PT evaluation  ADL  Overall ADL's  Needs assistance/impaired  Grooming Set up;Wash/dry face;Sitting  Upper Body Bathing Minimal assistance;Sitting  Lower Body Bathing Moderate assistance;Sit to/from stand;Sitting/lateral leans  Upper Body Dressing  Minimal assistance;Sitting  Lower Body Dressing Moderate assistance;Sitting/lateral leans;Sit to/from stand  Lower Body Dressing Details (indicate cue type and reason) due to decreased standing balance, activity tolerance   Toilet Transfer Minimal assistance  Toilet Transfer Details (indicate cue type and reason) sit to stand from recliner, min A to power up to standing, decreased standing tolerance   Toileting- Clothing Manipulation and Hygiene Moderate assistance;Sit to/from stand;Sitting/lateral lean  Functional mobility during ADLs Minimal assistance  General ADL Comments unsure of patient's baseline level of assist d/t hx of dementia, patient disoriented to place, situation. currently patient demonstrates decreased activity tolerance, balance, safety awareness requiring set up to min A for UB ADL and mod A for LB ADls  Bed Mobility  General bed mobility comments OOB upon arrival  Transfers  Overall transfer level Needs assistance  Equipment used 1 person hand held assist  Transfers Sit to/from Stand  Sit to Stand Min assist  General transfer comment min A to power up to standing from recliner chair, decreased activity tolerance needing to sit after ~15 sec standing  Balance  Overall balance assessment Needs assistance  Sitting-balance support Feet supported  Sitting balance-Leahy Scale Fair  Standing balance support  Single extremity supported  Standing  balance-Leahy Scale Poor  Standing balance comment reliant on UE support  General Comments  General comments (skin integrity, edema, etc.) patient desaturate to 82% on 15L HFNC with sit to stand. max cues for PLB   OT - End of Session  Equipment Utilized During Treatment Oxygen  Activity Tolerance Patient tolerated treatment well  Patient left in chair;with call bell/phone within reach;with chair alarm set  Nurse Communication Mobility status  OT Assessment  OT Recommendation/Assessment Patient needs continued OT Services  OT Visit Diagnosis Other abnormalities of gait and mobility (R26.89)  OT Problem List Cardiopulmonary status limiting activity;Decreased activity tolerance;Impaired balance (sitting and/or standing);Decreased safety awareness  OT Plan  OT Frequency (ACUTE ONLY) Min 2X/week  OT Treatment/Interventions (ACUTE ONLY) Self-care/ADL training;Therapeutic exercise;Energy conservation;Therapeutic activities;Patient/family education;Balance training  AM-PAC OT "6 Clicks" Daily Activity Outcome Measure (Version 2)  Help from another person eating meals? 3  Help from another person taking care of personal grooming? 3  Help from another person toileting, which includes using toliet, bedpan, or urinal? 2  Help from another person bathing (including washing, rinsing, drying)? 2  Help from another person to put on and taking off regular upper body clothing? 3  Help from another person to put on and taking off regular lower body clothing? 2  6 Click Score 15  OT Recommendation  Follow Up Recommendations Home health OT;Supervision/Assistance - 24 hour;Other (comment) (if family can provide level of assist)  OT Equipment None recommended by OT  Individuals Consulted  Consulted and Agree with Results and Recommendations Patient  Acute Rehab OT Goals  Patient Stated Goal "where's Charlie?"  OT Goal Formulation With patient  Time For Goal Achievement 09/24/20  Potential to Achieve  Goals Good  OT Time Calculation  OT Start Time (ACUTE ONLY) 1250  OT Stop Time (ACUTE ONLY) 1311  OT Time Calculation (min) 21 min  OT General Charges  $OT Visit 1 Visit  OT Evaluation  $OT Eval Moderate Complexity 1 Mod  Written Expression  Dominant Hand  (did not specify)   Marlyce Huge OT OT pager: [phone]

## 2020-09-11 DIAGNOSIS — Z515 Encounter for palliative care: Secondary | ICD-10-CM

## 2020-09-11 DIAGNOSIS — R0602 Shortness of breath: Secondary | ICD-10-CM

## 2020-09-11 DIAGNOSIS — Z7189 Other specified counseling: Secondary | ICD-10-CM

## 2020-09-11 LAB — CBC WITH DIFFERENTIAL/PLATELET
Abs Immature Granulocytes: 0.29 10*3/uL — ABNORMAL HIGH (ref 0.00–0.07)
Basophils Absolute: 0.1 10*3/uL (ref 0.0–0.1)
Basophils Relative: 1 %
Eosinophils Absolute: 0 10*3/uL (ref 0.0–0.5)
Eosinophils Relative: 0 %
HCT: 43 % (ref 39.0–52.0)
Hemoglobin: 13.3 g/dL (ref 13.0–17.0)
Immature Granulocytes: 2 %
Lymphocytes Relative: 4 %
Lymphs Abs: 0.7 10*3/uL (ref 0.7–4.0)
MCH: 29.5 pg (ref 26.0–34.0)
MCHC: 30.9 g/dL (ref 30.0–36.0)
MCV: 95.3 fL (ref 80.0–100.0)
Monocytes Absolute: 1.5 10*3/uL — ABNORMAL HIGH (ref 0.1–1.0)
Monocytes Relative: 8 %
Neutro Abs: 16.2 10*3/uL — ABNORMAL HIGH (ref 1.7–7.7)
Neutrophils Relative %: 85 %
Platelets: 400 10*3/uL (ref 150–400)
RBC: 4.51 MIL/uL (ref 4.22–5.81)
RDW: 12.7 % (ref 11.5–15.5)
WBC: 18.7 10*3/uL — ABNORMAL HIGH (ref 4.0–10.5)
nRBC: 0 % (ref 0.0–0.2)

## 2020-09-11 LAB — GLUCOSE, CAPILLARY
Glucose-Capillary: 153 mg/dL — ABNORMAL HIGH (ref 70–99)
Glucose-Capillary: 157 mg/dL — ABNORMAL HIGH (ref 70–99)
Glucose-Capillary: 160 mg/dL — ABNORMAL HIGH (ref 70–99)
Glucose-Capillary: 164 mg/dL — ABNORMAL HIGH (ref 70–99)
Glucose-Capillary: 165 mg/dL — ABNORMAL HIGH (ref 70–99)
Glucose-Capillary: 176 mg/dL — ABNORMAL HIGH (ref 70–99)
Glucose-Capillary: 180 mg/dL — ABNORMAL HIGH (ref 70–99)
Glucose-Capillary: 197 mg/dL — ABNORMAL HIGH (ref 70–99)

## 2020-09-11 LAB — COMPREHENSIVE METABOLIC PANEL
ALT: 20 U/L (ref 0–44)
AST: 27 U/L (ref 15–41)
Albumin: 3.2 g/dL — ABNORMAL LOW (ref 3.5–5.0)
Alkaline Phosphatase: 39 U/L (ref 38–126)
Anion gap: 8 (ref 5–15)
BUN: 37 mg/dL — ABNORMAL HIGH (ref 8–23)
CO2: 28 mmol/L (ref 22–32)
Calcium: 8.7 mg/dL — ABNORMAL LOW (ref 8.9–10.3)
Chloride: 105 mmol/L (ref 98–111)
Creatinine, Ser: 1.27 mg/dL — ABNORMAL HIGH (ref 0.61–1.24)
GFR, Estimated: 57 mL/min — ABNORMAL LOW (ref >60)
Glucose, Bld: 172 mg/dL — ABNORMAL HIGH (ref 70–99)
Potassium: 4.2 mmol/L (ref 3.5–5.1)
Sodium: 141 mmol/L (ref 135–145)
Total Bilirubin: 0.5 mg/dL (ref 0.3–1.2)
Total Protein: 7 g/dL (ref 6.5–8.1)

## 2020-09-11 LAB — C-REACTIVE PROTEIN: CRP: 8.7 mg/dL — ABNORMAL HIGH (ref <1.0)

## 2020-09-11 LAB — FERRITIN: Ferritin: 187 ng/mL (ref 24–336)

## 2020-09-11 LAB — D-DIMER, QUANTITATIVE: D-Dimer, Quant: 2.65 ug/mL-FEU — ABNORMAL HIGH (ref 0.00–0.50)

## 2020-09-11 LAB — PHOSPHORUS: Phosphorus: 3.2 mg/dL (ref 2.5–4.6)

## 2020-09-11 LAB — MAGNESIUM: Magnesium: 2.7 mg/dL — ABNORMAL HIGH (ref 1.7–2.4)

## 2020-09-11 MED ORDER — SODIUM CHLORIDE 0.9% FLUSH
10.0000 mL | Freq: Two times a day (BID) | INTRAVENOUS | Status: DC
  Administered 2020-09-11 – 2020-09-13 (×3): 10 mL via INTRAVENOUS

## 2020-09-11 MED ORDER — HALOPERIDOL LACTATE 5 MG/ML IJ SOLN
2.0000 mg | Freq: Once | INTRAMUSCULAR | Status: AC
  Administered 2020-09-11: 2 mg via INTRAVENOUS
  Filled 2020-09-11: qty 1

## 2020-09-11 MED ORDER — HYDRALAZINE HCL 25 MG PO TABS
25.0000 mg | ORAL_TABLET | Freq: Three times a day (TID) | ORAL | Status: DC
  Administered 2020-09-11 – 2020-09-12 (×3): 25 mg via ORAL
  Filled 2020-09-11 (×3): qty 1

## 2020-09-11 MED ORDER — HYDRALAZINE HCL 20 MG/ML IJ SOLN
10.0000 mg | Freq: Once | INTRAMUSCULAR | Status: AC
  Administered 2020-09-11: 10 mg via INTRAVENOUS
  Filled 2020-09-11: qty 1

## 2020-09-11 MED ORDER — AMLODIPINE BESYLATE 10 MG PO TABS
10.0000 mg | ORAL_TABLET | Freq: Every day | ORAL | Status: DC
  Administered 2020-09-11 – 2020-09-12 (×2): 10 mg via ORAL
  Filled 2020-09-11 (×3): qty 1

## 2020-09-11 NOTE — Consult Note (Signed)
Consultation Note Date: 09/11/2020   Patient Name: Daniel Mays  DOB: 12-01-1940  MRN: [id number]  Age / Sex: 79 y.o., male  PCP: Jerl Mina, MD Referring Physician: Leatha Gilding, MD  Reason for Consultation: Establishing goals of care  HPI/Patient Profile: 79 y.o. male  admitted on 09/18/2020   79 year old history of dementia, CAD with stent, history of CABG, COPD, renal artery stenosis, DM, HTN, HLD admitted to the hospital with COVID-19. He was initially diagnosed on 12/3, had worsening symptoms and eventually came to the hospital and was admitted on 12/6 with hypoxic respiratory failure in the setting of COVID-19 pneumonia.  Clinical Assessment and Goals of Care: Patient remains admitted to the ICU at [facility] in [place], [place] for acute hypoxic respiratory failure due to COVID-19 viral illness.  Hospital course complicated by escalating oxygen requirements, agitation and diminished oral intake.  Palliative consultation for goals of care discussions has been requested.  Patient is resting in bed, currently on high oxygen requirements, currently with nonrebreather mask.  Does not attempt to verbalize or engage.  He has a history of baseline dementia.  Currently not agitated. Discussed with pulmonary critical care team as well as respiratory therapy.  Chart reviewed.  Discussed with the daughter-in-law Chaya Jan on the phone.  Palliative medicine is specialized medical care for people living with serious illness. It focuses on providing relief from the symptoms and stress of a serious illness. The goal is to improve quality of life for both the patient and the family.  Goals of care: Broad aims of medical therapy in relation to the patient's values and preferences. Our aim is to provide medical care aimed at enabling patients to achieve the goals that matter most to them, given  the circumstances of their particular medical situation and their constraints.   Patient's daughter-in-law Chaya Jan was very appreciative of the care the patient has received in this hospitalization and for the information the family has been provided with with regards to the patient's overall condition.  Denies states that the family is well aware of the patient's tenuous respiratory status and that he has underlying comorbidities.  She states that he had a reasonable quality of life prior to this illness.  She has dropped off his advanced directives, teeth and some cards at the front desk.  They are clear about not placing the patient on a ventilator.  We reviewed about the patient's current condition.  Discussed about current scope of management.  Best case and worst case scenarios discussed.  Offered active listening and supportive care.  Ultimately, family's goals are to remain hopeful for some degree of stabilization/recovery so that they are able to take him home towards the end of this hospitalization.  Palliative to continue to follow along and help assist with appropriate decision making.  NEXT OF KIN  lives at home with wife, has 5 sons.   SUMMARY OF RECOMMENDATIONS   Partial Code, re discussed with daughter in law [name] on the phone, family is clear about  not intubating the patient.  Continue current mode of care, family aware of escalating O2 requirements and waxing/waning mental status.  PMT to follow.   Code Status/Advance Care Planning:  Limited code    Symptom Management:    as above.   Palliative Prophylaxis:   Delirium Protocol   Psycho-social/Spiritual:   Desire for further Chaplaincy support:yes  Additional Recommendations: Caregiving  Support/Resources  Prognosis:   Unable to determine  Discharge Planning: To Be Determined      Primary Diagnoses: Present on Admission: . Pneumonia due to COVID-19 virus   I have reviewed the medical record, interviewed the  patient and family, and examined the patient. The following aspects are pertinent.  Past Medical History:  Diagnosis Date  . Arthritis    knees  . CAD (coronary artery disease)    2D ECHO, 03/17/2010 - EF 45-50%, normal  . Chronic kidney disease    blockage in renal artery  . Diabetes mellitus without complication (HCC)    diet controlled  . GERD (gastroesophageal reflux disease)   . Hard of hearing   . Heart attack (HCC) 2007  . Hyperlipidemia   . Hypertension   . OSA (obstructive sleep apnea)    CPAP  . Wears dentures    full upper and lower   Social History   Socioeconomic History  . Marital status: Married    Spouse name: Not on file  . Number of children: Not on file  . Years of education: Not on file  . Highest education level: Not on file  Occupational History  . Occupation: Retired Scientist, research (medical)  Tobacco Use  . Smoking status: Former Smoker    Packs/day: 5.00    Years: 55.00    Pack years: 275.00    Types: Cigarettes    Start date: 10/04/1946    Quit date: 10/04/2001    Years since quitting: 18.9  . Smokeless tobacco: Never Used  Vaping Use  . Vaping Use: Never used  Substance and Sexual Activity  . Alcohol use: Yes    Alcohol/week: 3.0 standard drinks    Types: 3 Shots of liquor per week    Comment: occ  . Drug use: No  . Sexual activity: Not on file  Other Topics Concern  . Not on file  Social History Narrative  . Not on file   Social Determinants of Health   Financial Resource Strain: Not on file  Food Insecurity: Not on file  Transportation Needs: Not on file  Physical Activity: Not on file  Stress: Not on file  Social Connections: Not on file   Family History  Problem Relation Age of Onset  . Parkinson's disease Mother   . Diabetes Mother    Scheduled Meds: . amLODipine  10 mg Oral Daily  . vitamin C  500 mg Oral Daily  . aspirin EC  81 mg Oral Daily  . baricitinib  2 mg Oral Daily  . chlorhexidine  15 mL Mouth Rinse BID  .  Chlorhexidine Gluconate Cloth  6 each Topical Daily  . clopidogrel  75 mg Oral Daily  . docusate sodium  100 mg Oral BID  . donepezil  10 mg Oral QHS  . folic acid  1 mg Oral Daily  . heparin  5,000 Units Subcutaneous Q8H  . hydrALAZINE  25 mg Oral Q8H  . insulin aspart  0-9 Units Subcutaneous Q4H  . linagliptin  5 mg Oral Daily  . mouth rinse  15 mL Mouth Rinse BID  .  mouth rinse  15 mL Mouth Rinse q12n4p  . metoprolol tartrate  50 mg Oral BID  . multivitamin with minerals  1 tablet Oral Daily  . pantoprazole  40 mg Oral Daily  . predniSONE  50 mg Oral Daily  . rosuvastatin  20 mg Oral Daily  . thiamine  100 mg Oral Daily  . zinc sulfate  220 mg Oral Daily   Continuous Infusions: PRN Meds:.acetaminophen, chlorpheniramine-HYDROcodone, guaiFENesin-dextromethorphan, ondansetron **OR** ondansetron (ZOFRAN) IV, Resource ThickenUp Clear Medications Prior to Admission:  Prior to Admission medications   Medication Sig Start Date End Date Taking? Authorizing Provider  acetaminophen (TYLENOL) 500 MG tablet Take 1,000 mg by mouth every 8 (eight) hours as needed for mild pain.    Yes [provider]  albuterol (PROAIR HFA) 108 (90 Base) MCG/ACT inhaler Inhale 1 puff into the lungs every 6 (six) hours as needed for wheezing.  12/02/15  Yes [provider]  albuterol (PROVENTIL) (2.5 MG/3ML) 0.083% nebulizer solution Take 3 mLs (2.5 mg total) by nebulization every 6 (six) hours as needed for wheezing or shortness of breath. DX: COPD DX Code: J44.9 03/11/17  Yes Erin Fulling, MD  aspirin EC 81 MG tablet Take 81 mg by mouth daily. AM   Yes [provider]  clopidogrel (PLAVIX) 75 MG tablet Take 1 tablet by mouth once daily Patient taking differently: Take 75 mg by mouth daily.  11/22/19  Yes Lennette Bihari, MD  donepezil (ARICEPT) 10 MG tablet Take 10 mg by mouth at bedtime. 08/02/20  Yes [provider]  furosemide (LASIX) 40 MG tablet TAKE 1 TABLET BY MOUTH ONCE  DAILY -  NEED  OFFICE  VISIT Patient taking differently: Take 40 mg by mouth daily.  11/22/19  Yes Lennette Bihari, MD  metoprolol tartrate (LOPRESSOR) 50 MG tablet TAKE 1 TABLET BY MOUTH TWICE DAILY -  NEED  OFFICE  VISIT Patient taking differently: Take 50 mg by mouth 2 (two) times daily.  11/22/19  Yes Lennette Bihari, MD  potassium chloride SA (KLOR-CON) 20 MEQ tablet Take 1 tablet by mouth once daily Patient taking differently: Take 20 mEq by mouth daily.  10/15/19  Yes Lennette Bihari, MD  rosuvastatin (CRESTOR) 20 MG tablet Take 1 tablet by mouth once daily Patient taking differently: Take 20 mg by mouth daily.  08/26/20  Yes Lennette Bihari, MD  spironolactone (ALDACTONE) 25 MG tablet Take 1/2 (one-half) tablet by mouth once daily Patient taking differently: Take 12.5 mg by mouth daily.  08/26/20  Yes Lennette Bihari, MD  triamcinolone (KENALOG) 0.1 % Apply 1 application topically 3 (three) times daily as needed for rash. 08/21/20  Yes [provider]  budesonide-formoterol (SYMBICORT) 160-4.5 MCG/ACT inhaler Inhale 2 puffs into the lungs 2 (two) times daily. Patient not taking: Reported on 2020/09/19 03/10/17   Erin Fulling, MD  isosorbide mononitrate (IMDUR) 60 MG 24 hr tablet Take 1 tablet (60 mg total) by mouth daily. Please make annual appt with Dr. Tresa Endo for future refills. [phone]. 1st attempt. Patient not taking: Reported on 09-19-2020 08/21/19   Lennette Bihari, MD  pantoprazole (PROTONIX) 40 MG tablet TAKE ONE TABLET BY MOUTH ONCE DAILY Patient not taking: Reported on 19-Sep-2020 09/19/18   Marykay Lex, MD   No Known Allergies Review of Systems Currently nonverbal Physical Exam Resting in bed Confused Has nonrebreather mask Shallow respirations Monitor reviewed Does not follow commands Abdomen is not distended  Vital Signs: BP (!) 192/79  Pulse 87   Temp 98.1 F (36.7 C) (Axillary)   Resp (!) 38   Ht 5\' 6"  (1.676 m)   Wt 100.6 kg Comment: Unable to  zero bed  SpO2 (!) 85% Comment: O2 sats 83-85%  BMI 35.80 kg/m  Pain Scale: PAINAD   Pain Score: 0-No pain   SpO2: SpO2: (!) 85 % (O2 sats 83-85%) O2 Device:SpO2: (!) 85 % (O2 sats 83-85%) O2 Flow Rate: .O2 Flow Rate (L/min): 50 L/min (+ PRB)  IO: Intake/output summary:   Intake/Output Summary (Last 24 hours) at 09/11/2020 1459 Last data filed at 09/11/2020 0900 Gross per 24 hour  Intake 390 ml  Output 1200 ml  Net -810 ml    LBM: Last BM Date:  (PTA) Baseline Weight: Weight: 100 kg Most recent weight: Weight: 100.6 kg (Unable to zero bed)     Palliative Assessment/Data:   Palliative performance scale 30%  Time In: 1400 Time Out: 1500 Time Total: 60 Greater than 50%  of this time was spent counseling and coordinating care related to the above assessment and plan.  Signed by: 14/06/2020, MD   Please contact Palliative Medicine Team phone at [phone] for questions and concerns.  For individual provider: See [phone]

## 2020-09-11 NOTE — Progress Notes (Signed)
Nutrition Follow-up  DOCUMENTATION CODES:   Obesity unspecified  INTERVENTION:  - will order Hormel Shake BID with meals, each supplement provides 500 kcal and 22 grams protein. - will Magic Cup BID with meals, each supplement provides 290 kcal and 9 grams of protein.   NUTRITION DIAGNOSIS:   Increased nutrient needs related to acute illness,catabolic illness (COVID-19 infection) as evidenced by estimated needs. -ongoing  GOAL:   Patient will meet greater than or equal to 90% of their needs -unmet  MONITOR:   PO intake,Supplement acceptance,Diet advancement,Labs,Weight trends  ASSESSMENT:   79 year-old male with medical history of HTN, heart attack, HLD, CAD, OSA, GERD, DM, arthritis, CKD, and who is hard of hearing. He presented to the [facility] on 12/6 for planned outpatient monoclonal antibody treatment for known COVID infection. He is not vaccinated against COVID. He was diagnosed with COVID on 12/3 and has been progressively more SOB since that time.  Patient briefly discussed in rounds this AM.   Diet advanced from NPO to FLD, nectar-thick on 12/7 at 1326. The only documented intake was 75% of breakfast this AM.  Patient noted to be a/o to self only which was confirmed by RN. She confirms that patient had breakfast this AM and indicates that he did have some slight coughing during the meal. Will monitor as unsure if this is related to swallowing issue vs cough from COVID PNA.   RN reports that patient has been very drowsy today. He is able to awake to take pills but then falls back to sleep quickly.   Weight on 12/6 was 220 lb, on 12/7 was 218 lb, and yesterday was 222 lb. Mild pitting edema to BLE documented in the flow sheet.   Per notes: - acute hypoxic respiratory failure on HFNC and NRB d/t COVID - BiPAP at night d/t hx of COPD - AKI on stage 3 CKD - hx of dementia - now DNI and Palliative Care has been consulted  Labs reviewed; CBGs: 213, 165,  160 mg/dl, BUN: 37 mg/dl, creatinine: 2.53 mg/dl, Ca: 8.7 mg/dl, Mg: 2.7 mg/dl, GFR" 57 ml/min.  Medications reviewed; 100 mg colace BID, 1 mg folvite/day, sliding scale novolog, 50 mg deltasone/day starting 12/9, 1 tablet multivitamin with minerals/day, 40 mg oral protonix/day, 100 mg thiamine/day, 220 mg zinc sulfate/day.     NUTRITION - FOCUSED PHYSICAL EXAM:  not completed at this time.   Diet Order:   Diet Order            Diet full liquid Room service appropriate? Yes; Fluid consistency: Nectar Thick  Diet effective now                 EDUCATION NEEDS:   Not appropriate for education at this time  Skin:  Skin Assessment: Reviewed RN Assessment  Last BM:  PTA/unknown  Height:   Ht Readings from Last 1 Encounters:  09/24/2020 5\' 6"  (1.676 m)    Weight:   Wt Readings from Last 1 Encounters:  09/10/20 100.6 kg     Estimated Nutritional Needs:  Kcal:  1560-1795 kcal (20-23 kcal/kg adjBW) Protein:  100-110 grams Fluid:  >/= 1.9 L/day      14/08/21, MS, RD, LDN, CNSC Inpatient Clinical Dietitian RD pager # available in [facility]  After hours/weekend pager # available in [facility]

## 2020-09-11 NOTE — Progress Notes (Addendum)
PROGRESS NOTE  Daniel Mays VWU:[id number] DOB: [birth year] DOA: 2020-09-12 PCP: Jerl Mina, MD   LOS: 3 days   Brief Narrative / Interim history: 79 year old history of dementia, CAD with stent, history of CABG, COPD, renal artery stenosis, DM, HTN, HLD admitted to the hospital with COVID-19. He was initially diagnosed on 12/3, had worsening symptoms and eventually came to the hospital and was admitted on 12/6 with hypoxic respiratory failure in the setting of COVID-19 pneumonia.  Subjective / 24h Interval events: Appears to have been very agitated overnight.  One more oxygen this morning.  Assessment & Plan:  Principal Problem Acute Hypoxic Respiratory Failure due to Covid-19 Viral Illness -Initially admitted to the ICU, transferred to the hospitalist service 12/8 -Remains profoundly hypoxic satting in the mid upper 80s on 15 L, complicating factor is that overnight patient has delirium and self removes oxygen. -Started on steroids, baricitinib on 12/6. Continue. -Not started on remdesivir due to questionable benefit   COVID-19 Labs  Recent Labs    2020-09-12 1151 09/09/20 0248 09/10/20 0252 09/11/20 0257  DDIMER 3.70* 3.13* 2.97* 2.65*  FERRITIN 114 134 167 187  LDH 334*  --   --   --   CRP 23.9* 19.8* 11.9* 8.7*    Lab Results  Component Value Date   SARSCOV2NAA POSITIVE (A) 09/12/2020   Active Problems OSA, history of COPD -Continue BiPAP nightly, continue steroids, no wheezing  Acute kidney injury on chronic kidney disease stage IIIb -With underlying renal artery stenosis. Monitor renal function. Creatinine appears to be at his baseline and slightly improved today at 1.2.  Malignant hypertension -Patient has been more hypertensive overnight and this morning, will add amlodipine as well as hydralazine, continue as needed's  CAD with history of stents, CABG, HTN, HLD -Continue aspirin, Plavix, statin, metoprolol.  No chest pain  Dementia -Continue home  Aricept  DM2 -Continue sliding scale, linagliptin, CBGs within acceptable range  CBG (last 3)  Recent Labs    09/10/20 0056 09/10/20 0437 09/10/20 0747  GLUCAP 213* 165* 160*   Goals of care -Patient has underlying dementia currently quite hypoxic.  He is a DNI per his wishes, palliative care was also consulted  Scheduled Meds: . amLODipine  10 mg Oral Daily  . vitamin C  500 mg Oral Daily  . aspirin EC  81 mg Oral Daily  . baricitinib  2 mg Oral Daily  . chlorhexidine  15 mL Mouth Rinse BID  . Chlorhexidine Gluconate Cloth  6 each Topical Daily  . clopidogrel  75 mg Oral Daily  . docusate sodium  100 mg Oral BID  . donepezil  10 mg Oral QHS  . folic acid  1 mg Oral Daily  . heparin  5,000 Units Subcutaneous Q8H  . insulin aspart  0-9 Units Subcutaneous Q4H  . linagliptin  5 mg Oral Daily  . mouth rinse  15 mL Mouth Rinse BID  . mouth rinse  15 mL Mouth Rinse q12n4p  . metoprolol tartrate  50 mg Oral BID  . multivitamin with minerals  1 tablet Oral Daily  . pantoprazole  40 mg Oral Daily  . predniSONE  50 mg Oral Daily  . rosuvastatin  20 mg Oral Daily  . thiamine  100 mg Oral Daily  . zinc sulfate  220 mg Oral Daily   Continuous Infusions: PRN Meds:.acetaminophen, chlorpheniramine-HYDROcodone, guaiFENesin-dextromethorphan, ondansetron **OR** ondansetron (ZOFRAN) IV, Resource ThickenUp Clear  DVT prophylaxis: heparin Code Status: Full code Family Communication: no family at bedside  Status is: Inpatient  Remains inpatient appropriate because:Inpatient level of care appropriate due to severity of illness   Dispo: The patient is from: Home              Anticipated d/c is to: Home              Anticipated d/c date is: > 3 days              Patient currently is not medically stable to d/c.   Consultants:  PCCM Palliative  Procedures:  None   Microbiology: None   Antibacterials: None    Objective: Vitals:   09/11/20 0500 09/11/20 0600 09/11/20 0837  09/11/20 0900  BP: (!) 198/75 (!) 189/56    Pulse: 60 69    Resp: (!) 33 (!) 28    Temp: 98 F (36.7 C)   98.1 F (36.7 C)  TempSrc: Axillary   Axillary  SpO2: (!) 89% (!) 86% (!) 87%   Weight:      Height:        Intake/Output Summary (Last 24 hours) at 09/11/2020 1027 Last data filed at 09/11/2020 0900 Gross per 24 hour  Intake 390 ml  Output 1200 ml  Net -810 ml   Filed Weights   09/22/2020 1551 09/09/20 0436 09/10/20 0500  Weight: 100 kg 98.8 kg 100.6 kg    Examination:  Constitutional: In bed, confused Eyes: No icterus seen ENMT: Moist mucous membranes Neck: normal, supple Respiratory: Tachypneic, shallow respirations, diffuse rhonchi at the bases, no wheezing Cardiovascular: Regular rate and rhythm, no murmurs, trace edema Abdomen: Soft, nontender, nondistended, bowel sounds positive Musculoskeletal: no clubbing / cyanosis.  Skin: No rashes seen Neurologic: Does not follow commands, nonfocal however appears to move all 4 extremities independently  Data Reviewed: I have independently reviewed following labs and imaging studies   CBC: Recent Labs  Lab 09/26/2020 1151 09/09/20 0248 09/10/20 0252 09/11/20 0257  WBC 12.0* 9.9 18.3* 18.7*  NEUTROABS 9.9* 8.7* 15.6* 16.2*  HGB 14.0 13.5 13.8 13.3  HCT 45.3 43.7 43.3 43.0  MCV 97.2 97.3 94.3 95.3  PLT 268 275 321 400   Basic Metabolic Panel: Recent Labs  Lab 09/28/2020 1151 09/09/20 0248 09/10/20 0252 09/11/20 0257  NA 139 140 140 141  K 4.4 5.0 4.4 4.2  CL 102 104 103 105  CO2 25 23 26 28   GLUCOSE 143* 168* 208* 172*  BUN 34* 42* 53* 37*  CREATININE 1.71* 1.67* 1.61* 1.27*  CALCIUM 8.8* 8.7* 9.0 8.7*  MG  --  2.8* 2.7* 2.7*  PHOS  --  3.9 2.6 3.2   GFR: Estimated Creatinine Clearance: 50.7 mL/min (A) (by C-G formula based on SCr of 1.27 mg/dL (H)). Liver Function Tests: Recent Labs  Lab 09/07/2020 1151 09/09/20 0248 09/10/20 0252 09/11/20 0257  AST 39 31 30 27   ALT 23 22 24 20   ALKPHOS 39 38 39  39  BILITOT 0.4 0.8 0.4 0.5  PROT 8.3* 7.7 7.4 7.0  ALBUMIN 3.9 3.6 3.3* 3.2*   No results for input(s): LIPASE, AMYLASE in the last 168 hours. No results for input(s): AMMONIA in the last 168 hours. Coagulation Profile: No results for input(s): INR, PROTIME in the last 168 hours. Cardiac Enzymes: No results for input(s): CKTOTAL, CKMB, CKMBINDEX, TROPONINI in the last 168 hours. BNP (last 3 results) No results for input(s): PROBNP in the last 8760 hours. HbA1C: No results for input(s): HGBA1C in the last 72 hours. CBG: Recent Labs  Lab 09/09/20  1515 09/09/20 1938 09/10/20 0056 09/10/20 0437 09/10/20 0747  GLUCAP 164* 199* 213* 165* 160*   Lipid Profile: Recent Labs    27-Sep-2020 1151  TRIG 170*   Thyroid Function Tests: No results for input(s): TSH, T4TOTAL, FREET4, T3FREE, THYROIDAB in the last 72 hours. Anemia Panel: Recent Labs    09/10/20 0252 09/11/20 0257  FERRITIN 167 187   Urine analysis: No results found for: COLORURINE, APPEARANCEUR, LABSPEC, PHURINE, GLUCOSEU, HGBUR, BILIRUBINUR, KETONESUR, PROTEINUR, UROBILINOGEN, NITRITE, LEUKOCYTESUR Sepsis Labs: Invalid input(s): PROCALCITONIN, LACTICIDVEN  Recent Results (from the past 240 hour(s))  Resp Panel by RT-PCR (Flu A&B, Covid) Nasopharyngeal Swab     Status: Abnormal   Collection Time: 09/27/2020 11:50 AM   Specimen: Nasopharyngeal Swab; Nasopharyngeal(NP) swabs in vial transport medium  Result Value Ref Range Status   SARS Coronavirus 2 by RT PCR POSITIVE (A) NEGATIVE Final    Comment: RESULT CALLED TO, READ BACK BY AND VERIFIED WITH: JACOBS,D. RN @1332  ON 09-27-2020 BY NMCCOY (NOTE) SARS-CoV-2 target nucleic acids are DETECTED.  The SARS-CoV-2 RNA is generally detectable in upper respiratory specimens during the acute phase of infection. Positive results are indicative of the presence of the identified virus, but do not rule out bacterial infection or co-infection with other pathogens not detected by  the test. Clinical correlation with patient history and other diagnostic information is necessary to determine patient infection status. The expected result is Negative.  Fact Sheet for Patients: BloggerCourse.com  Fact Sheet for Healthcare Providers: SeriousBroker.it  This test is not yet approved or cleared by the [place] FDA and  has been authorized for detection and/or diagnosis of SARS-CoV-2 by FDA under an Emergency Use Authorization (EUA).  This EUA will remain in effect (meaning this tes t can be used) for the duration of  the COVID-19 declaration under Section 564(b)(1) of the Act, 21 U.S.C. section 360bbb-3(b)(1), unless the authorization is terminated or revoked sooner.     Influenza A by PCR NEGATIVE NEGATIVE Final   Influenza B by PCR NEGATIVE NEGATIVE Final    Comment: (NOTE) The Xpert Xpress SARS-CoV-2/FLU/RSV plus assay is intended as an aid in the diagnosis of influenza from Nasopharyngeal swab specimens and should not be used as a sole basis for treatment. Nasal washings and aspirates are unacceptable for Xpert Xpress SARS-CoV-2/FLU/RSV testing.  Fact Sheet for Patients: BloggerCourse.com  Fact Sheet for Healthcare Providers: SeriousBroker.it  This test is not yet approved or cleared by the [place] FDA and has been authorized for detection and/or diagnosis of SARS-CoV-2 by FDA under an Emergency Use Authorization (EUA). This EUA will remain in effect (meaning this test can be used) for the duration of the COVID-19 declaration under Section 564(b)(1) of the Act, 21 U.S.C. section 360bbb-3(b)(1), unless the authorization is terminated or revoked.  Performed at [facility], [address], [place], [place] [address]   Blood Culture (routine x 2)     Status: None (Preliminary result)   Collection Time: 2020-09-27 11:51 AM    Specimen: BLOOD  Result Value Ref Range Status   Specimen Description   Final    BLOOD RIGHT ANTECUBITAL Performed at [facility], [address], [place], [place] [address]    Special Requests   Final    BOTTLES DRAWN AEROBIC AND ANAEROBIC Blood Culture adequate volume Performed at [facility], [address] [place], [place] [address]    Culture   Final    NO GROWTH 3 DAYS Performed at  [facility], [address] [place], [place] [address]    Report Status PENDING  Incomplete  Blood Culture (routine x 2)     Status: None (Preliminary result)   Collection Time: 09/22/2020 11:51 AM   Specimen: BLOOD  Result Value Ref Range Status   Specimen Description   Final    BLOOD RIGHT WRIST Performed at [facility], [address] [place], [place] [address]    Special Requests   Final    BOTTLES DRAWN AEROBIC AND ANAEROBIC Blood Culture adequate volume Performed at [facility], [address] [place], [place] [address]    Culture   Final    NO GROWTH 3 DAYS Performed at [facility], [address], [place], [place] [address]    Report Status PENDING  Incomplete  MRSA PCR Screening     Status: None   Collection Time: 09/07/2020  5:13 PM   Specimen: Nasopharyngeal  Result Value Ref Range Status   MRSA by PCR NEGATIVE NEGATIVE Final    Comment:        The GeneXpert MRSA Assay (FDA approved for NASAL specimens only), is one component of a comprehensive MRSA colonization surveillance program. It is not intended to diagnose MRSA infection nor to guide or monitor treatment for MRSA infections. Performed at [facility], [address] [place], [place] [address]       Radiology Studies: No results found. Pamella Pert, MD, PhD [facility]  Between 7 am - 7 pm I am available, please contact me via Amion or Securechat  Between 7 pm - 7 am I am not available,  please contact night coverage MD/APP via Amion

## 2020-09-11 NOTE — Progress Notes (Signed)
Called Daughter-in-Law, Sinjin Amero, to follow up for discussion from yesterday.  She indicates that the family does not want to put him on mechanical ventilation.  They are going to bring his formal paperwork by the hospital for staff to make copies. Discussed with family that this can be a clinical course of frequent up's / down's and every day can be different. Reviewed that our goal is to get him home with restored health but there is a real possibility that he may not get through this hospitalization.  Reviewed increased O2 needs as of 12/9.  Family indicates understanding. They request to be called with any changes.    [name], MSN, NP-C, AGACNP-BC [facility] 09/11/2020, 9:21 AM   Please see [email or web address] for pager details.

## 2020-09-12 ENCOUNTER — Inpatient Hospital Stay (HOSPITAL_COMMUNITY): Payer: PPO

## 2020-09-12 DIAGNOSIS — Z515 Encounter for palliative care: Secondary | ICD-10-CM

## 2020-09-12 DIAGNOSIS — Z7189 Other specified counseling: Secondary | ICD-10-CM

## 2020-09-12 LAB — CBC WITH DIFFERENTIAL/PLATELET
Abs Immature Granulocytes: 1.31 10*3/uL — ABNORMAL HIGH (ref 0.00–0.07)
Basophils Absolute: 0 10*3/uL (ref 0.0–0.1)
Basophils Relative: 0 %
Eosinophils Absolute: 0 10*3/uL (ref 0.0–0.5)
Eosinophils Relative: 0 %
HCT: 45 % (ref 39.0–52.0)
Hemoglobin: 14.3 g/dL (ref 13.0–17.0)
Immature Granulocytes: 5 %
Lymphocytes Relative: 3 %
Lymphs Abs: 0.8 10*3/uL (ref 0.7–4.0)
MCH: 30 pg (ref 26.0–34.0)
MCHC: 31.8 g/dL (ref 30.0–36.0)
MCV: 94.5 fL (ref 80.0–100.0)
Monocytes Absolute: 2 10*3/uL — ABNORMAL HIGH (ref 0.1–1.0)
Monocytes Relative: 8 %
Neutro Abs: 20.6 10*3/uL — ABNORMAL HIGH (ref 1.7–7.7)
Neutrophils Relative %: 84 %
Platelets: 412 10*3/uL — ABNORMAL HIGH (ref 150–400)
RBC: 4.76 MIL/uL (ref 4.22–5.81)
RDW: 13 % (ref 11.5–15.5)
WBC: 24.7 10*3/uL — ABNORMAL HIGH (ref 4.0–10.5)
nRBC: 0 % (ref 0.0–0.2)

## 2020-09-12 LAB — PHOSPHORUS: Phosphorus: 2.9 mg/dL (ref 2.5–4.6)

## 2020-09-12 LAB — C-REACTIVE PROTEIN: CRP: 15.9 mg/dL — ABNORMAL HIGH (ref <1.0)

## 2020-09-12 LAB — COMPREHENSIVE METABOLIC PANEL
ALT: 19 U/L (ref 0–44)
AST: 26 U/L (ref 15–41)
Albumin: 3.3 g/dL — ABNORMAL LOW (ref 3.5–5.0)
Alkaline Phosphatase: 49 U/L (ref 38–126)
Anion gap: 12 (ref 5–15)
BUN: 42 mg/dL — ABNORMAL HIGH (ref 8–23)
CO2: 26 mmol/L (ref 22–32)
Calcium: 9.1 mg/dL (ref 8.9–10.3)
Chloride: 106 mmol/L (ref 98–111)
Creatinine, Ser: 1.25 mg/dL — ABNORMAL HIGH (ref 0.61–1.24)
GFR, Estimated: 58 mL/min — ABNORMAL LOW (ref >60)
Glucose, Bld: 174 mg/dL — ABNORMAL HIGH (ref 70–99)
Potassium: 4.4 mmol/L (ref 3.5–5.1)
Sodium: 144 mmol/L (ref 135–145)
Total Bilirubin: 0.6 mg/dL (ref 0.3–1.2)
Total Protein: 7.5 g/dL (ref 6.5–8.1)

## 2020-09-12 LAB — GLUCOSE, CAPILLARY
Glucose-Capillary: 167 mg/dL — ABNORMAL HIGH (ref 70–99)
Glucose-Capillary: 179 mg/dL — ABNORMAL HIGH (ref 70–99)
Glucose-Capillary: 149 mg/dL — ABNORMAL HIGH (ref 70–99)
Glucose-Capillary: 159 mg/dL — ABNORMAL HIGH (ref 70–99)
Glucose-Capillary: 189 mg/dL — ABNORMAL HIGH (ref 70–99)

## 2020-09-12 LAB — BRAIN NATRIURETIC PEPTIDE: B Natriuretic Peptide: 266.2 pg/mL — ABNORMAL HIGH (ref 0.0–100.0)

## 2020-09-12 LAB — FERRITIN: Ferritin: 359 ng/mL — ABNORMAL HIGH (ref 24–336)

## 2020-09-12 LAB — D-DIMER, QUANTITATIVE: D-Dimer, Quant: 2.72 ug/mL-FEU — ABNORMAL HIGH (ref 0.00–0.50)

## 2020-09-12 LAB — MAGNESIUM: Magnesium: 3 mg/dL — ABNORMAL HIGH (ref 1.7–2.4)

## 2020-09-12 MED ORDER — HYDRALAZINE HCL 20 MG/ML IJ SOLN
10.0000 mg | Freq: Four times a day (QID) | INTRAMUSCULAR | Status: DC | PRN
  Administered 2020-09-13 (×2): 10 mg via INTRAVENOUS
  Filled 2020-09-12 (×2): qty 1

## 2020-09-12 MED ORDER — PIPERACILLIN-TAZOBACTAM 3.375 G IVPB
3.3750 g | Freq: Three times a day (TID) | INTRAVENOUS | Status: DC
  Administered 2020-09-12 – 2020-09-13 (×4): 3.375 g via INTRAVENOUS
  Filled 2020-09-12 (×4): qty 50

## 2020-09-12 MED ORDER — CLONIDINE HCL 0.2 MG/24HR TD PTWK
0.2000 mg | MEDICATED_PATCH | TRANSDERMAL | Status: DC
  Administered 2020-09-12: 0.2 mg via TRANSDERMAL
  Filled 2020-09-12: qty 1

## 2020-09-12 MED ORDER — FUROSEMIDE 10 MG/ML IJ SOLN
40.0000 mg | Freq: Once | INTRAMUSCULAR | Status: AC
  Administered 2020-09-12: 40 mg via INTRAVENOUS
  Filled 2020-09-12: qty 4

## 2020-09-12 MED ORDER — HYDRALAZINE HCL 50 MG PO TABS: 50.0000 mg | ORAL_TABLET | Freq: Three times a day (TID) | ORAL | Status: DC

## 2020-09-12 NOTE — Progress Notes (Addendum)
PT Cancellation Note  Patient Details Name: ADVITH MARTINE MRN: [id number] DOB: 11-Dec-1940   Cancelled Treatment:    Reason Eval/Treat Not Completed: Medical issues which prohibited therapy, per RN/ MD note, worsening condition. PT will sign off at this time.   Rada Hay 09/12/2020, 12:25 PM Blanchard Kelch PT Acute Rehabilitation Services Pager [phone] Office [phone]

## 2020-09-12 NOTE — Progress Notes (Signed)
PROGRESS NOTE  Daniel Mays WFU:[id number] DOB: Apr 02, 1941 DOA: 09/17/2020 PCP: Jerl Mina, MD   LOS: 4 days   Brief Narrative / Interim history: 79 year old history of dementia, CAD with stent, history of CABG, COPD, renal artery stenosis, DM, HTN, HLD admitted to the hospital with COVID-19. He was initially diagnosed on 12/3, had worsening symptoms and eventually came to the hospital and was admitted on 12/6 with hypoxic respiratory failure in the setting of COVID-19 pneumonia.  Subjective / 24h Interval events: Continues to get worse despite medical treatment.  Currently on heated high flow 100% FiO2.  Not as delirious  Assessment & Plan:  Principal Problem Acute Hypoxic Respiratory Failure due to Covid-19 Viral Illness -Initially admitted to the ICU service, transferred to the hospitalist.  Despite treatment with steroids, baricitinib, his hypoxia has progressively gotten worse and currently he is on heated high flow.  He is tachypneic and seems to be struggling to breathe.  -Complicating factor is that overnight patient has delirium and self removes oxygen. -Started on steroids, baricitinib on 12/6. Continue. -Not started on remdesivir due to questionable benefit -Increased WBC today, chest x-ray looks worse, will add antibiotics.  BNP slightly elevated, will give Lasix for completeness   COVID-19 Labs  Recent Labs    09/10/20 0252 09/11/20 0257 09/12/20 0250  DDIMER 2.97* 2.65* 2.72*  FERRITIN 167 187 359*  CRP 11.9* 8.7* 15.9*    Lab Results  Component Value Date   SARSCOV2NAA POSITIVE (A) 09/06/2020   Active Problems OSA, history of COPD -Continue BiPAP nightly, continue steroids, no wheezing  Acute kidney injury on chronic kidney disease stage IIIb -With underlying renal artery stenosis. Monitor renal function.  Creatinine at baseline at 1.2, give Lasix and monitor  Malignant hypertension -Patient has been more hypertensive overnight and this morning,  will add amlodipine as well as hydralazine, increase hydralazine today due to persistent hypotension  CAD with history of stents, CABG, HTN, HLD -Continue aspirin, Plavix, statin, metoprolol.  No apparent chest pain  Dementia with ICU induced delirium -Continue home Aricept  DM2 -Continue sliding scale, linagliptin, CBGs within acceptable range  CBG (last 3)  Recent Labs    09/11/20 0850 09/11/20 1246 09/11/20 1623  GLUCAP 176* 197* 180*   Goals of care -Discussed with Hamza Empson [phone] this morning, he is progressively getting worse.  Family wishes for him to be home however this is not possible due to high oxygen requirements.  Given rapid worsening over the last 3 days he seems to be dying and may pass in the hospital.  Recommend family be allowed to visit at this point.  Scheduled Meds: . amLODipine  10 mg Oral Daily  . vitamin C  500 mg Oral Daily  . aspirin EC  81 mg Oral Daily  . baricitinib  2 mg Oral Daily  . chlorhexidine  15 mL Mouth Rinse BID  . Chlorhexidine Gluconate Cloth  6 each Topical Daily  . clopidogrel  75 mg Oral Daily  . docusate sodium  100 mg Oral BID  . donepezil  10 mg Oral QHS  . folic acid  1 mg Oral Daily  . heparin  5,000 Units Subcutaneous Q8H  . hydrALAZINE  50 mg Oral Q8H  . insulin aspart  0-9 Units Subcutaneous Q4H  . linagliptin  5 mg Oral Daily  . mouth rinse  15 mL Mouth Rinse BID  . mouth rinse  15 mL Mouth Rinse q12n4p  . metoprolol tartrate  50 mg Oral BID  .  multivitamin with minerals  1 tablet Oral Daily  . pantoprazole  40 mg Oral Daily  . predniSONE  50 mg Oral Daily  . rosuvastatin  20 mg Oral Daily  . sodium chloride flush  10 mL Intravenous Q12H  . thiamine  100 mg Oral Daily  . zinc sulfate  220 mg Oral Daily   Continuous Infusions: PRN Meds:.acetaminophen, chlorpheniramine-HYDROcodone, guaiFENesin-dextromethorphan, ondansetron **OR** ondansetron (ZOFRAN) IV, Resource ThickenUp Clear  DVT prophylaxis:  heparin Code Status: Full code Family Communication: no family at bedside, discussed with daughter-in-law over the phone  Status is: Inpatient  Remains inpatient appropriate because:Inpatient level of care appropriate due to severity of illness   Dispo: The patient is from: Home              Anticipated d/c is to: Home              Anticipated d/c date is: > 3 days anticipate in hospital death              Patient currently is not medically stable to d/c.   Consultants:  PCCM Palliative  Procedures:  None   Microbiology: None   Antibacterials: None    Objective: Vitals:   09/12/20 0700 09/12/20 0800 09/12/20 0813 09/12/20 0900  BP:  (!) 202/86  (!) 182/73  Pulse: 83 81  78  Resp: (!) 23 (!) 32  (!) 30  Temp:   (!) 97.2 F (36.2 C)   TempSrc:   Axillary   SpO2: (!) 88% (!) 87% (!) 88% (!) 89%  Weight:      Height:        Intake/Output Summary (Last 24 hours) at 09/12/2020 0957 Last data filed at 09/12/2020 0600 Gross per 24 hour  Intake 430 ml  Output 925 ml  Net -495 ml   Filed Weights   09/09/20 0436 09/10/20 0500 09/12/20 0412  Weight: 98.8 kg 100.6 kg 97.5 kg    Examination:  Constitutional: In bed, confused, tachypneic, appears uncomfortable Eyes: No scleral icterus ENMT: mmm Neck: normal, supple Respiratory: Tachypneic, shallow respirations, diffuse rhonchi at the bases, no wheezing heard Cardiovascular: Regular rate and rhythm, no murmurs, trace edema Abdomen: Soft, does not appear to be tender, bowel sounds positive Musculoskeletal: no clubbing / cyanosis.  Skin: No new rashes Neurologic: Does not follow commands, however appears nonfocal as he moves all 4 extremities independently  Data Reviewed: I have independently reviewed following labs and imaging studies   CBC: Recent Labs  Lab 09/16/2020 1151 09/09/20 0248 09/10/20 0252 09/11/20 0257 09/12/20 0250  WBC 12.0* 9.9 18.3* 18.7* 24.7*  NEUTROABS 9.9* 8.7* 15.6* 16.2* 20.6*  HGB  14.0 13.5 13.8 13.3 14.3  HCT 45.3 43.7 43.3 43.0 45.0  MCV 97.2 97.3 94.3 95.3 94.5  PLT 268 275 321 400 412*   Basic Metabolic Panel: Recent Labs  Lab 09/15/2020 1151 09/09/20 0248 09/10/20 0252 09/11/20 0257 09/12/20 0250  NA 139 140 140 141 144  K 4.4 5.0 4.4 4.2 4.4  CL 102 104 103 105 106  CO2 25 23 26 28 26   GLUCOSE 143* 168* 208* 172* 174*  BUN 34* 42* 53* 37* 42*  CREATININE 1.71* 1.67* 1.61* 1.27* 1.25*  CALCIUM 8.8* 8.7* 9.0 8.7* 9.1  MG  --  2.8* 2.7* 2.7* 3.0*  PHOS  --  3.9 2.6 3.2 2.9   GFR: Estimated Creatinine Clearance: 50.7 mL/min (A) (by C-G formula based on SCr of 1.25 mg/dL (H)). Liver Function Tests: Recent Labs  Lab 2020/09/25 1151 09/09/20 0248 09/10/20 0252 09/11/20 0257 09/12/20 0250  AST 39 31 30 27 26   ALT 23 22 24 20 19   ALKPHOS 39 38 39 39 49  BILITOT 0.4 0.8 0.4 0.5 0.6  PROT 8.3* 7.7 7.4 7.0 7.5  ALBUMIN 3.9 3.6 3.3* 3.2* 3.3*   No results for input(s): LIPASE, AMYLASE in the last 168 hours. No results for input(s): AMMONIA in the last 168 hours. Coagulation Profile: No results for input(s): INR, PROTIME in the last 168 hours. Cardiac Enzymes: No results for input(s): CKTOTAL, CKMB, CKMBINDEX, TROPONINI in the last 168 hours. BNP (last 3 results) No results for input(s): PROBNP in the last 8760 hours. HbA1C: No results for input(s): HGBA1C in the last 72 hours. CBG: Recent Labs  Lab 09/10/20 2353 09/11/20 0503 09/11/20 0850 09/11/20 1246 09/11/20 1623  GLUCAP 153* 160* 176* 197* 180*   Lipid Profile: No results for input(s): CHOL, HDL, LDLCALC, TRIG, CHOLHDL, LDLDIRECT in the last 72 hours. Thyroid Function Tests: No results for input(s): TSH, T4TOTAL, FREET4, T3FREE, THYROIDAB in the last 72 hours. Anemia Panel: Recent Labs    09/11/20 0257 09/12/20 0250  FERRITIN 187 359*   Urine analysis: No results found for: COLORURINE, APPEARANCEUR, LABSPEC, PHURINE, GLUCOSEU, HGBUR, BILIRUBINUR, KETONESUR, PROTEINUR,  UROBILINOGEN, NITRITE, LEUKOCYTESUR Sepsis Labs: Invalid input(s): PROCALCITONIN, LACTICIDVEN  Recent Results (from the past 240 hour(s))  Resp Panel by RT-PCR (Flu A&B, Covid) Nasopharyngeal Swab     Status: Abnormal   Collection Time: Sep 25, 2020 11:50 AM   Specimen: Nasopharyngeal Swab; Nasopharyngeal(NP) swabs in vial transport medium  Result Value Ref Range Status   SARS Coronavirus 2 by RT PCR POSITIVE (A) NEGATIVE Final    Comment: RESULT CALLED TO, READ BACK BY AND VERIFIED WITH: JACOBS,D. RN @1332  ON 09/25/2020 BY NMCCOY (NOTE) SARS-CoV-2 target nucleic acids are DETECTED.  The SARS-CoV-2 RNA is generally detectable in upper respiratory specimens during the acute phase of infection. Positive results are indicative of the presence of the identified virus, but do not rule out bacterial infection or co-infection with other pathogens not detected by the test. Clinical correlation with patient history and other diagnostic information is necessary to determine patient infection status. The expected result is Negative.  Fact Sheet for Patients: BloggerCourse.com  Fact Sheet for Healthcare Providers: SeriousBroker.it  This test is not yet approved or cleared by the [place] FDA and  has been authorized for detection and/or diagnosis of SARS-CoV-2 by FDA under an Emergency Use Authorization (EUA).  This EUA will remain in effect (meaning this tes t can be used) for the duration of  the COVID-19 declaration under Section 564(b)(1) of the Act, 21 U.S.C. section 360bbb-3(b)(1), unless the authorization is terminated or revoked sooner.     Influenza A by PCR NEGATIVE NEGATIVE Final   Influenza B by PCR NEGATIVE NEGATIVE Final    Comment: (NOTE) The Xpert Xpress SARS-CoV-2/FLU/RSV plus assay is intended as an aid in the diagnosis of influenza from Nasopharyngeal swab specimens and should not be used as a sole basis for treatment.  Nasal washings and aspirates are unacceptable for Xpert Xpress SARS-CoV-2/FLU/RSV testing.  Fact Sheet for Patients: BloggerCourse.com  Fact Sheet for Healthcare Providers: SeriousBroker.it  This test is not yet approved or cleared by the [place] FDA and has been authorized for detection and/or diagnosis of SARS-CoV-2 by FDA under an Emergency Use Authorization (EUA). This EUA will remain in effect (meaning this test can be used) for the duration of the COVID-19 declaration  under Section 564(b)(1) of the Act, 21 U.S.C. section 360bbb-3(b)(1), unless the authorization is terminated or revoked.  Performed at [facility], [address], [place], [place] [address]   Blood Culture (routine x 2)     Status: None (Preliminary result)   Collection Time: 09/20/2020 11:51 AM   Specimen: BLOOD  Result Value Ref Range Status   Specimen Description   Final    BLOOD RIGHT ANTECUBITAL Performed at [facility], [address] [place], [place] [address]    Special Requests   Final    BOTTLES DRAWN AEROBIC AND ANAEROBIC Blood Culture adequate volume Performed at [facility], [address], [place], [place] [address]    Culture   Final    NO GROWTH 4 DAYS Performed at [facility], [address], [place], [place] [address]    Report Status PENDING  Incomplete  Blood Culture (routine x 2)     Status: None (Preliminary result)   Collection Time: 09/23/2020 11:51 AM   Specimen: BLOOD  Result Value Ref Range Status   Specimen Description   Final    BLOOD RIGHT WRIST Performed at [facility], [address] [place], [place] [address]    Special Requests   Final    BOTTLES DRAWN AEROBIC AND ANAEROBIC Blood Culture adequate volume Performed at [facility], [address] [place], [place] [address]    Culture   Final    NO GROWTH 4  DAYS Performed at [facility], [address], [place], [place] [address]    Report Status PENDING  Incomplete  MRSA PCR Screening     Status: None   Collection Time: 09/19/2020  5:13 PM   Specimen: Nasopharyngeal  Result Value Ref Range Status   MRSA by PCR NEGATIVE NEGATIVE Final    Comment:        The GeneXpert MRSA Assay (FDA approved for NASAL specimens only), is one component of a comprehensive MRSA colonization surveillance program. It is not intended to diagnose MRSA infection nor to guide or monitor treatment for MRSA infections. Performed at [facility], [address], [place], [place] [address]       Radiology Studies: DG CHEST PORT 1 VIEW  Result Date: 09/12/2020 CLINICAL DATA:  Shortness of breath.  Hypoxemia. EXAM: PORTABLE CHEST 1 VIEW COMPARISON:  09/09/2020 FINDINGS: Slightly decreased lung volumes. Increased opacities in the right lung particularly at the right lung base. There may be increased airspace densities in the left upper lung region. Evidence of previous CABG procedure. Negative for a pneumothorax. IMPRESSION: Decreased lung volumes with increased airspace densities in the right lung, particularly at the right lung base. Findings are concerning for pneumonia. Questionable increased densities in the left upper lung region. Electronically Signed   By: Richarda Overlie M.D.   On: 09/12/2020 09:30   Pamella Pert, MD, PhD [facility]  Between 7 am - 7 pm I am available, please contact me via Amion or Securechat  Between 7 pm - 7 am I am not available, please contact night coverage MD/APP via Amion

## 2020-09-12 NOTE — Progress Notes (Signed)
PMT no charge note.  Chart reviewed, seen briefly, discussed with TRH MD and also with bedside RN, call placed, discussed with daughter in law, family to arrive at the bedside.  BP (!) 155/62   Pulse 74   Temp 99 F (37.2 C) (Axillary)   Resp (!) 27   Ht 5\' 6"  (1.676 m)   Wt 97.5 kg   SpO2 (!) 86%   BMI 34.69 kg/m  Labs and imaging noted.  Continue current mode of care.  Overall patient with high risk of ongoing decline and decompensation, family is aware.  PMT to follow over the weekend.  MD Addy palliative.

## 2020-09-12 NOTE — TOC Progression Note (Signed)
Transition of Care [facility]) - Progression Note    Patient Details  Name: Daniel Mays MRN: [id number] Date of Birth: 1941/04/07  [facility] CM/SW Contact  Golda Acre, RN Phone Number: 09/12/2020, 7:30 AM  Clinical Narrative:    Partial Code, re discussed with daughter in law [name] on the phone, family is clear about not intubating the patient.  Continue current mode of care, family aware of escalating O2 requirements and waxing/waning mental status.  PMT to follow.   Code Status/Advance Care Planning:  Limited code/ no intubation/cpr  Plan is return to home if he survivies  Following for progression.    Expected Discharge Plan: Home/Self Care Barriers to Discharge: No Barriers Identified  Expected Discharge Plan and Services Expected Discharge Plan: Home/Self Care   Discharge Planning Services: CM Consult   Living arrangements for the past 2 months: Single Family Home                                       Social Determinants of Health (SDOH) Interventions    Readmission Risk Interventions No flowsheet data found.

## 2020-09-12 NOTE — Progress Notes (Signed)
Speech Language Pathology Discharge Patient Details Name: BERGEN MAGNER MRN: [id number] DOB: March 29, 1941 Today's Date: 09/12/2020 Time:  -     Patient discharged from SLP services secondary to medical decline - will need to re-order SLP to resume therapy services.  Please see latest therapy progress note for current level of functioning and progress toward goals.    Progress and discharge plan discussed with patient and/or caregiver: Patient unable to participate in discharge planning and no caregivers available  GO   Rolena Infante, MS [facility] SLP Acute Rehab Services Office [phone] Pager [phone]    Chales Abrahams 09/12/2020, 2:20 PM

## 2020-09-12 NOTE — Progress Notes (Signed)
Pharmacy Antibiotic Note  Daniel Mays is a 79 y.o. male admitted on 09-19-20 with COVID pneumonia.  Pharmacy has been consulted for Zosyn dosing today for suspected aspiration pneumonia.  Plan: Zosyn 3.375g IV Q8H infused over 4hrs. Follow up renal function, culture results, and clinical course.   Height: 5\' 6"  (167.6 cm) Weight: 97.5 kg (214 lb 15.2 oz) IBW/kg (Calculated) : 63.8  Temp (24hrs), Avg:98 F (36.7 C), Min:97.2 F (36.2 C), Max:98.5 F (36.9 C)  Recent Labs  Lab 09-19-2020 1151 Sep 19, 2020 1349 09/09/20 0248 09/10/20 0252 09/11/20 0257 09/12/20 0250  WBC 12.0*  --  9.9 18.3* 18.7* 24.7*  CREATININE 1.71*  --  1.67* 1.61* 1.27* 1.25*  LATICACIDVEN 1.7 1.3  --   --   --   --     Estimated Creatinine Clearance: 50.7 mL/min (A) (by C-G formula based on SCr of 1.25 mg/dL (H)).    No Known Allergies  Antimicrobials this admission: 12/10 Zosyn >>   Dose adjustments this admission:   Microbiology results: 12/6 BCx: ngtd 12/6 MRSA PCR: negative  Thank you for allowing pharmacy to be a part of this patient's care.  14/6 PharmD, BCPS Clinical Pharmacist [facility] main pharmacy [phone] 09/12/2020 10:16 AM

## 2020-09-12 NOTE — Progress Notes (Signed)
OT Cancellation Note  Patient Details Name: Daniel Mays MRN: [id number] DOB: 12/27/1940   Cancelled Treatment:    Reason Eval/Treat Not Completed: Medical issues which prohibited therapy Patient with declining medical status, will hold OT today.  Marlyce Huge OT OT pager: [phone]   Carmelia Roller 09/12/2020, 11:56 AM

## 2020-09-13 DIAGNOSIS — R0902 Hypoxemia: Secondary | ICD-10-CM

## 2020-09-13 LAB — CULTURE, BLOOD (ROUTINE X 2)
Culture: NO GROWTH
Culture: NO GROWTH
Special Requests: ADEQUATE
Special Requests: ADEQUATE

## 2020-09-13 LAB — CBC WITH DIFFERENTIAL/PLATELET
Abs Immature Granulocytes: 1.25 10*3/uL — ABNORMAL HIGH (ref 0.00–0.07)
Basophils Absolute: 0 10*3/uL (ref 0.0–0.1)
Basophils Relative: 0 %
Eosinophils Absolute: 0 10*3/uL (ref 0.0–0.5)
Eosinophils Relative: 0 %
HCT: 45.4 % (ref 39.0–52.0)
Hemoglobin: 14.3 g/dL (ref 13.0–17.0)
Immature Granulocytes: 6 %
Lymphocytes Relative: 3 %
Lymphs Abs: 0.7 10*3/uL (ref 0.7–4.0)
MCH: 30.2 pg (ref 26.0–34.0)
MCHC: 31.5 g/dL (ref 30.0–36.0)
MCV: 95.8 fL (ref 80.0–100.0)
Monocytes Absolute: 1.8 10*3/uL — ABNORMAL HIGH (ref 0.1–1.0)
Monocytes Relative: 8 %
Neutro Abs: 17.9 10*3/uL — ABNORMAL HIGH (ref 1.7–7.7)
Neutrophils Relative %: 83 %
Platelets: 420 10*3/uL — ABNORMAL HIGH (ref 150–400)
RBC: 4.74 MIL/uL (ref 4.22–5.81)
RDW: 13.2 % (ref 11.5–15.5)
WBC: 21.7 10*3/uL — ABNORMAL HIGH (ref 4.0–10.5)
nRBC: 0.1 % (ref 0.0–0.2)

## 2020-09-13 LAB — COMPREHENSIVE METABOLIC PANEL
ALT: 16 U/L (ref 0–44)
AST: 23 U/L (ref 15–41)
Albumin: 3 g/dL — ABNORMAL LOW (ref 3.5–5.0)
Alkaline Phosphatase: 48 U/L (ref 38–126)
Anion gap: 13 (ref 5–15)
BUN: 51 mg/dL — ABNORMAL HIGH (ref 8–23)
CO2: 29 mmol/L (ref 22–32)
Calcium: 8.9 mg/dL (ref 8.9–10.3)
Chloride: 105 mmol/L (ref 98–111)
Creatinine, Ser: 1.85 mg/dL — ABNORMAL HIGH (ref 0.61–1.24)
GFR, Estimated: 36 mL/min — ABNORMAL LOW (ref >60)
Glucose, Bld: 161 mg/dL — ABNORMAL HIGH (ref 70–99)
Potassium: 4.2 mmol/L (ref 3.5–5.1)
Sodium: 147 mmol/L — ABNORMAL HIGH (ref 135–145)
Total Bilirubin: 0.4 mg/dL (ref 0.3–1.2)
Total Protein: 7.3 g/dL (ref 6.5–8.1)

## 2020-09-13 LAB — FERRITIN: Ferritin: 531 ng/mL — ABNORMAL HIGH (ref 24–336)

## 2020-09-13 LAB — MAGNESIUM: Magnesium: 3.1 mg/dL — ABNORMAL HIGH (ref 1.7–2.4)

## 2020-09-13 LAB — C-REACTIVE PROTEIN: CRP: 19.8 mg/dL — ABNORMAL HIGH (ref <1.0)

## 2020-09-13 LAB — D-DIMER, QUANTITATIVE: D-Dimer, Quant: 3.13 ug/mL-FEU — ABNORMAL HIGH (ref 0.00–0.50)

## 2020-09-13 LAB — PHOSPHORUS: Phosphorus: 4.3 mg/dL (ref 2.5–4.6)

## 2020-09-13 MED ORDER — METHYLPREDNISOLONE SODIUM SUCC 125 MG IJ SOLR
60.0000 mg | Freq: Two times a day (BID) | INTRAMUSCULAR | Status: DC
  Administered 2020-09-13: 14:00:00 60 mg via INTRAVENOUS
  Filled 2020-09-13: qty 2

## 2020-09-13 MED ORDER — SODIUM CHLORIDE 0.9 % IV SOLN
INTRAVENOUS | Status: DC | PRN
  Administered 2020-09-13: 03:00:00 250 mL via INTRAVENOUS

## 2020-09-13 MED ORDER — CLONIDINE HCL 0.3 MG/24HR TD PTWK
0.3000 mg | MEDICATED_PATCH | TRANSDERMAL | Status: DC
  Administered 2020-09-13: 10:00:00 0.3 mg via TRANSDERMAL
  Filled 2020-09-13: qty 1

## 2020-09-13 MED ORDER — HYDROMORPHONE HCL 1 MG/ML IJ SOLN: 0.5000 mg | INTRAMUSCULAR | Status: DC | PRN

## 2020-09-13 MED ORDER — HYDROMORPHONE HCL 1 MG/ML IJ SOLN
0.5000 mg | INTRAMUSCULAR | Status: DC | PRN
  Administered 2020-09-13: 0.5 mg via INTRAVENOUS

## 2020-09-13 MED ORDER — SODIUM CHLORIDE 0.9 % IV SOLN
0.5000 mg/h | INTRAVENOUS | Status: DC
  Administered 2020-09-13: 17:00:00 0.5 mg/h via INTRAVENOUS
  Filled 2020-09-13: qty 2.5

## 2020-09-15 LAB — GLUCOSE, CAPILLARY
Glucose-Capillary: 121 mg/dL — ABNORMAL HIGH (ref 70–99)
Glucose-Capillary: 130 mg/dL — ABNORMAL HIGH (ref 70–99)
Glucose-Capillary: 132 mg/dL — ABNORMAL HIGH (ref 70–99)
Glucose-Capillary: 176 mg/dL — ABNORMAL HIGH (ref 70–99)
Glucose-Capillary: 181 mg/dL — ABNORMAL HIGH (ref 70–99)
Glucose-Capillary: 191 mg/dL — ABNORMAL HIGH (ref 70–99)
Glucose-Capillary: 192 mg/dL — ABNORMAL HIGH (ref 70–99)

## 2020-09-19 IMAGING — MR MR HEAD W/O CM
3 series · 48 of 48 positions shown · non-contrast
Comparison: 07/29/2014

CLINICAL DATA: Memory loss.

EXAM:
MRI HEAD WITHOUT CONTRAST
TECHNIQUE: Multiplanar, multiecho pulse sequences of the brain and surrounding
structures were obtained without intravenous contrast.
Additionally, using NeuroQuant software a 3D volumetric analysis of
the brain was performed and is compared to a normative database
adjusted for age, gender and intracranial volume.

[Series 52: nqsegcb_sc_cor · 1.00mm/px · 17 of 240 slices shown]
[im 1/240]
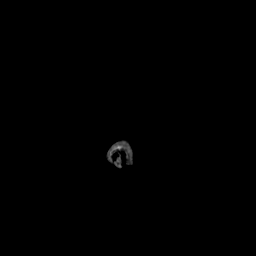
[im 15/240]
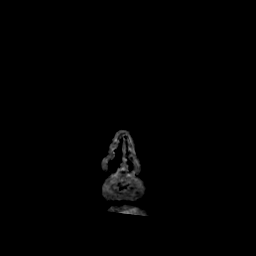
[im 30/240]
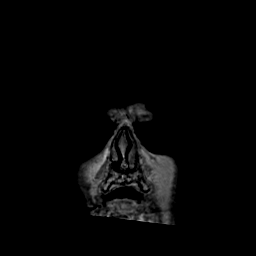
[im 45/240]
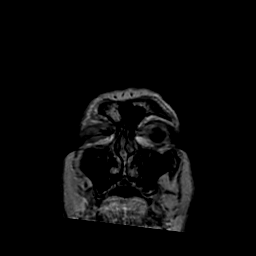
[im 60/240]
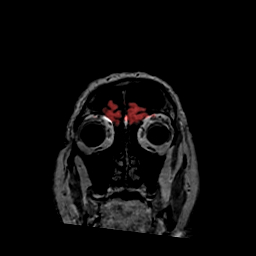
[im 75/240]
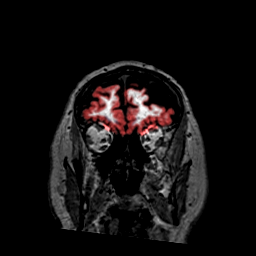
[im 90/240]
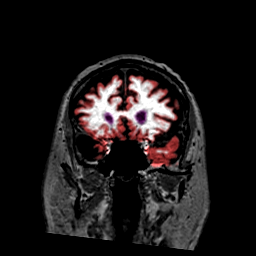
[im 105/240]
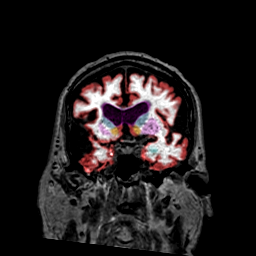
[im 120/240]
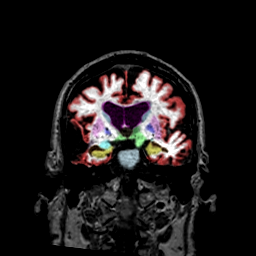
[im 135/240]
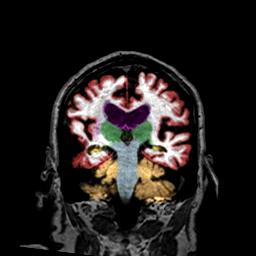
[im 150/240]
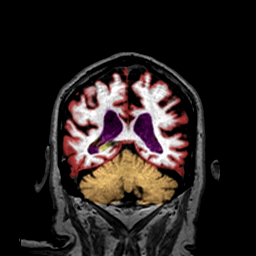
[im 165/240]
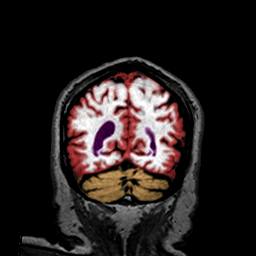
[im 180/240]
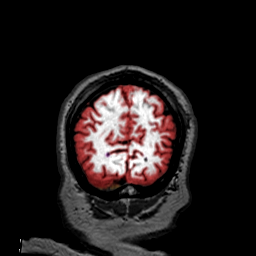
[im 195/240]
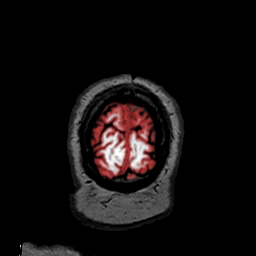
[im 210/240]
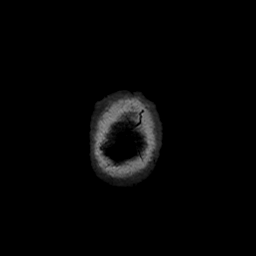
[im 225/240]
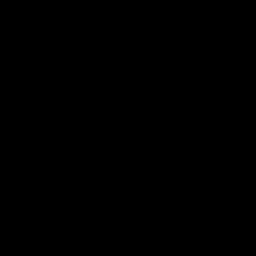
[im 240/240]
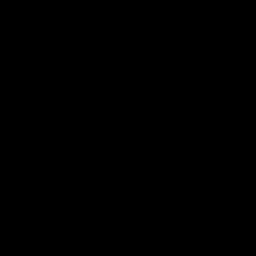

[Series 53: nqsegcb_sc_axl · 1.00mm/px · 16 of 221 slices shown]
[im 1/221]
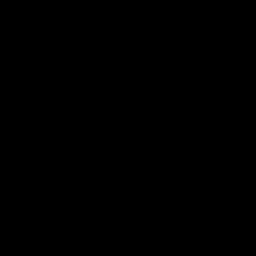
[im 15/221]
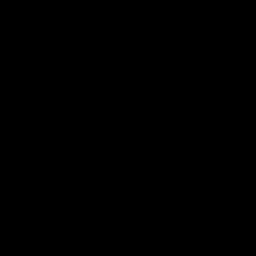
[im 30/221]
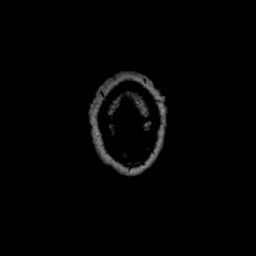
[im 45/221]
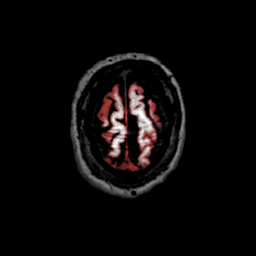
[im 59/221]
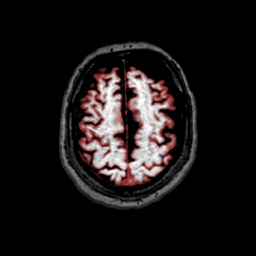
[im 74/221]
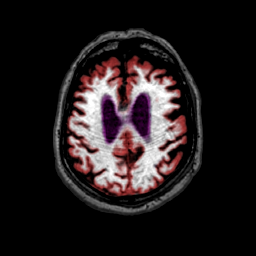
[im 89/221]
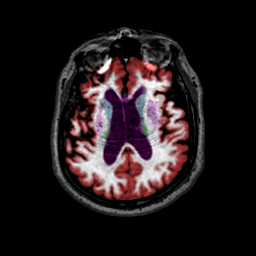
[im 103/221]
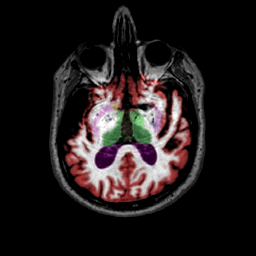
[im 118/221]
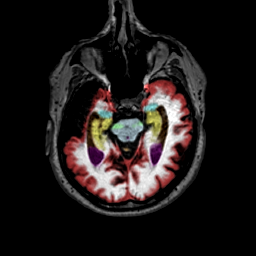
[im 133/221]
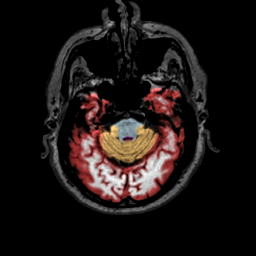
[im 147/221]
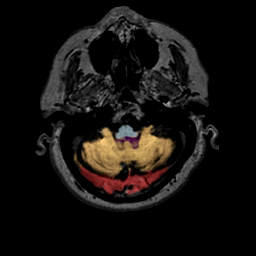
[im 162/221]
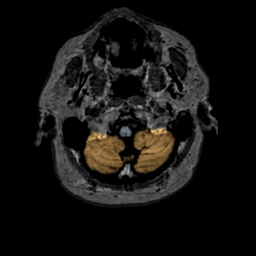
[im 177/221]
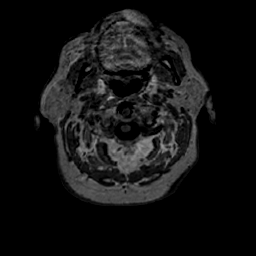
[im 191/221]
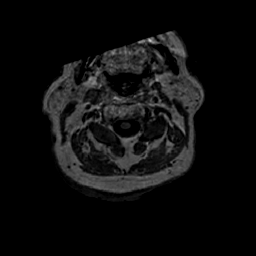
[im 206/221]
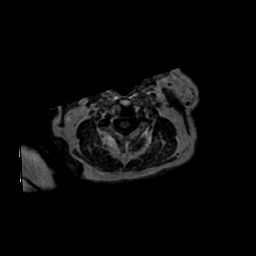
[im 221/221]
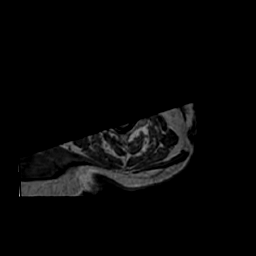

[Series 54: nqsegcb_sc_sag · 1.00mm/px · 15 of 203 slices shown]
[im 1/203]
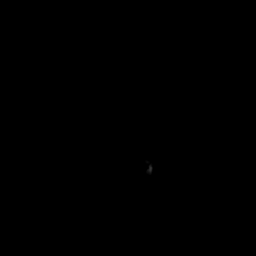
[im 15/203]
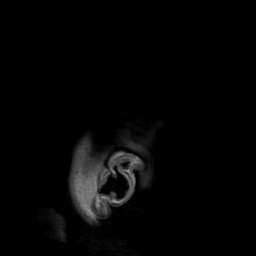
[im 29/203]
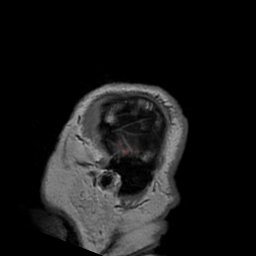
[im 44/203]
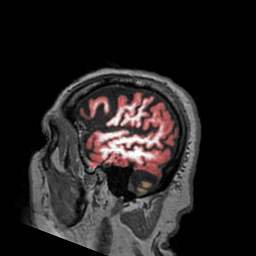
[im 58/203]
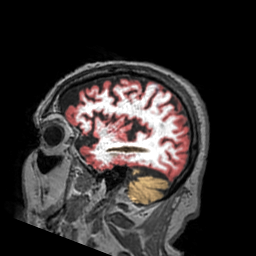
[im 73/203]
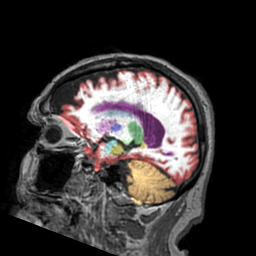
[im 87/203]
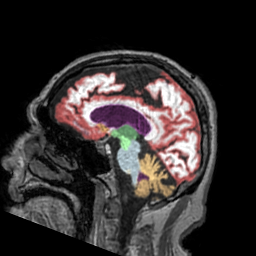
[im 102/203]
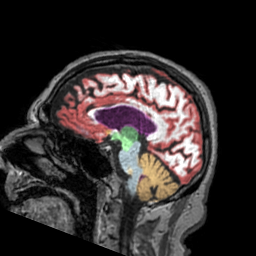
[im 116/203]
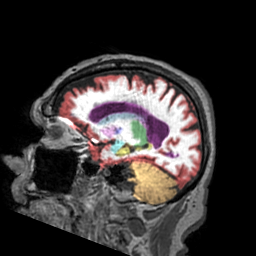
[im 130/203]
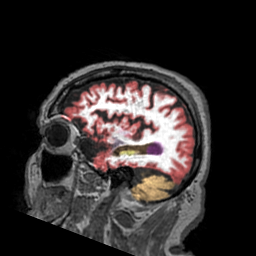
[im 145/203]
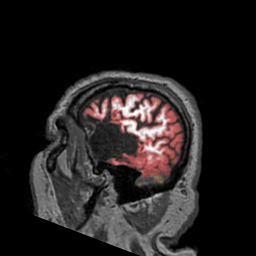
[im 159/203]
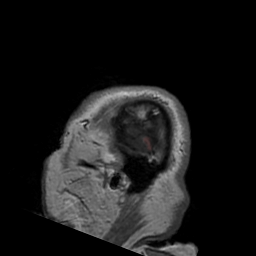
[im 174/203]
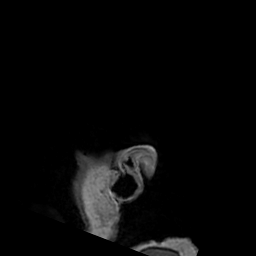
[im 188/203]
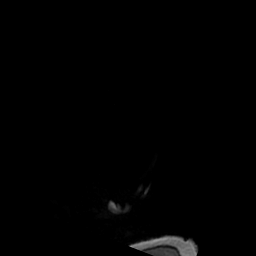
[im 203/203]
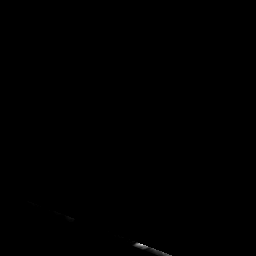

[48 of 48 positions shown; findings below may reference images not displayed]

FINDINGS: Brain: There is no evidence of acute infarct, intracranial
hemorrhage, mass, midline shift, or extra-axial fluid collection. A
moderate-sized chronic right MCA infarct is again noted primarily
involving the lateral aspect of the right temporal lobe. Small foci
of T2 hyperintensity in the cerebral white matter bilaterally have
mildly progressed from the prior MRI and are nonspecific but
compatible with chronic small vessel ischemic disease, mild for age.
There is an unchanged small chronic infarct in the right cerebellum.
Dilated perivascular spaces are noted in the basal ganglia
bilaterally. A 3.5 cm focus of extra-axial CSF over the left frontal
convexity is unchanged and consistent with an arachnoid cyst.
Ventricular enlargement secondary to cerebral atrophy is unchanged.

Vascular: Chronically abnormal appearance of the distal right
vertebral artery which may reflect occlusion or severely reduced
flow. Other major intracranial vascular flow voids are preserved.

Skull and upper cervical spine: Unremarkable bone marrow signal.

Sinuses/Orbits: Bilateral cataract extraction. Paranasal sinuses and
mastoid air cells are clear.

Other: None.

NeuroQuant Findings:

Volumetric analysis of the brain was performed, with a fully
detailed report in [HOSPITAL] PACS. Briefly, the comparison with age and
gender matched reference reveals whole brain volume to be at the 3rd
percentile.
IMPRESSION: 1. No acute intracranial abnormality.
2. Chronic right MCA and cerebellar infarcts.
3. Mild chronic small vessel ischemia in the cerebral white matter,
mildly progressed from 6291.
4. NeuroQuant volumetric analysis of the brain, see details on
[HOSPITAL] PACS.

## 2020-10-04 NOTE — Progress Notes (Addendum)
Daily Progress Note   Patient Name: Daniel Mays       Date: 09/16/2020 DOB: Sep 07, 1941  Age: 80 y.o. MRN#: [id number] Attending Physician: Leatha Gilding, MD Primary Care Physician: Jerl Mina, MD Admit Date: Oct 01, 2020  Reason for Consultation/Follow-up: Establishing goals of care  Subjective:  remains with high O2 requirements.   Length of Stay: 5  Current Medications: Scheduled Meds:  . amLODipine  10 mg Oral Daily  . vitamin C  500 mg Oral Daily  . aspirin EC  81 mg Oral Daily  . baricitinib  2 mg Oral Daily  . chlorhexidine  15 mL Mouth Rinse BID  . Chlorhexidine Gluconate Cloth  6 each Topical Daily  . cloNIDine  0.3 mg Transdermal Weekly  . clopidogrel  75 mg Oral Daily  . docusate sodium  100 mg Oral BID  . donepezil  10 mg Oral QHS  . folic acid  1 mg Oral Daily  . heparin  5,000 Units Subcutaneous Q8H  . insulin aspart  0-9 Units Subcutaneous Q4H  . linagliptin  5 mg Oral Daily  . mouth rinse  15 mL Mouth Rinse BID  . mouth rinse  15 mL Mouth Rinse q12n4p  . metoprolol tartrate  50 mg Oral BID  . multivitamin with minerals  1 tablet Oral Daily  . pantoprazole  40 mg Oral Daily  . predniSONE  50 mg Oral Daily  . rosuvastatin  20 mg Oral Daily  . sodium chloride flush  10 mL Intravenous Q12H  . thiamine  100 mg Oral Daily  . zinc sulfate  220 mg Oral Daily    Continuous Infusions: . sodium chloride 250 mL (09/09/2020 0248)  . piperacillin-tazobactam (ZOSYN)  IV 3.375 g (10/03/2020 1042)    PRN Meds: sodium chloride, acetaminophen, chlorpheniramine-HYDROcodone, guaiFENesin-dextromethorphan, hydrALAZINE, ondansetron **OR** ondansetron (ZOFRAN) IV, Resource ThickenUp Clear           Vital Signs: BP (!) 174/68   Pulse 79   Temp 99.6 F (37.6 C)  (Axillary)   Resp (!) 29   Ht 5\' 6"  (1.676 m)   Wt 93.6 kg   SpO2 (!) 85%   BMI 33.31 kg/m  SpO2: SpO2: (!) 85 % O2 Device: O2 Device: High Flow Nasal Cannula,Partial Rebreather Mask O2 Flow Rate: O2 Flow Rate (L/min): 60 L/min  Intake/output summary:   Intake/Output Summary (Last 24 hours) at Sep 22, 2020 1127 Last data filed at 22-Sep-2020 0600 Gross per 24 hour  Intake 99.8 ml  Output 1050 ml  Net -950.2 ml   LBM: Last BM Date:  (PTA) Baseline Weight: Weight: 100 kg Most recent weight: Weight: 93.6 kg Noted to be less alert, has high O2 requirements, not eating. Is on BIPAP.       Palliative Assessment/Data:      Patient Active Problem List   Diagnosis Date Noted  . Palliative care by specialist   . Goals of care, counseling/discussion   . Pneumonia due to COVID-19 virus 09/16/2020  . COPD (chronic obstructive pulmonary disease) (HCC) 03/10/2017  . GERD (gastroesophageal reflux disease) 03/10/2017  . Heartburn 03/10/2017  . Hypertension 03/10/2017  . MI (myocardial infarction) (HCC) 03/10/2017  . Nocturia 03/10/2017  . Mixed Alzheimer's and vascular dementia (HCC) 07/20/2016  . Obesity (BMI 30-39.9) 07/20/2016  . TIA (transient ischemic attack) 07/28/2014  . Obesity 06/20/2013  . Hyperlipidemia with target LDL less than 70 03/22/2013  . Sleep apnea, obstructive 03/22/2013  . Renal artery stenosis (HCC) 03/22/2013  . Renal cyst 03/22/2013  . Coronary atherosclerosis of native coronary artery 03/22/2013  . Dyspnea 08/16/2012    Palliative Care Assessment & Plan   Patient Profile:  80 year old history of dementia, CAD with stent, history of CABG, COPD, renal artery stenosis, DM, HTN, HLD admitted to the hospital with COVID-19. He was initially diagnosed on 12/3, had worsening symptoms and eventually came to the hospital and was admitted on 12/6 with hypoxic respiratory failure in the setting of COVID-19 pneumonia.  Assessment:  functional decline.  High O2  requirements.    Recommendations/Plan:  Discussed with daughter-in-law Gae Dry on the phone.  Gave her update about the patient's ongoing escalating oxygen requirements, worsening renal function and ongoing decline and decompensation.  Discussed about appropriateness of comfort measures.  Daughter-in-law Gae Dry is thankful for information provided, states that the patient's wife and patient's children are aware of the serious nature of the patient's condition however remain hopeful for some degree of stabilization/recovery.  Explained to her in detail about BiPAP and current mode of care. Plan:  Family meeting scheduled for 12-11 at 1400-1430 with patient's wife and children along with daughter-in-law Gae Dry.  Have recommended full scope of comfort measures, complete DNR/DNI and aggressive symptom management.  Code Status:    Code Status Orders  (From admission, onward)         Start     Ordered   09/10/20 1707  Limited resuscitation (code)  Continuous       Question Answer Comment  In the event of cardiac or respiratory ARREST: Initiate Code Blue, Call Rapid Response Yes   In the event of cardiac or respiratory ARREST: Perform CPR Yes   In the event of cardiac or respiratory ARREST: Perform Intubation/Mechanical Ventilation No   In the event of cardiac or respiratory ARREST: Use NIPPV/BiPAp only if indicated Yes   In the event of cardiac or respiratory ARREST: Administer ACLS medications if indicated Yes   In the event of cardiac or respiratory ARREST: Perform Defibrillation or Cardioversion if indicated Yes      09/10/20 1706        Code Status History    Date Active Date Inactive Code Status Order ID Comments User Context   09/11/2020 1544 09/10/2020 1706 Full Code [id number]  Jeanella Craze, NP ED   07/29/2014 0002 07/30/2014 2109 Full Code [id number]  Therisa Doyne, MD Inpatient   Advance Care Planning Activity       Prognosis:   guarded.   Discharge Planning:  To Be  Determined  Care plan was discussed with RN and daughter in law [name] on the phone.   Thank you for allowing the Palliative Medicine Team to assist in the care of this patient.   Time In: 11 Time Out: 11.35 Total Time 35 Prolonged Time Billed No       Greater than 50%  of this time was spent counseling and coordinating care related to the above assessment and plan.  Rosalin Hawking, MD  Please contact Palliative Medicine Team phone at [phone] for questions and concerns.

## 2020-10-04 NOTE — Progress Notes (Addendum)
PMT additional progress note.  Patient's condition was reassessed.  I discussed with [facility] MD as well as with bedside nursing staff.  Patient's family arrived at the bedside.  Introductions were made.  Met with the patient's 4 sons, wife and daughter-in-law.  Subsequently, we moved to a consultation room outside of the intensive care unit along with Dr. Cruzita Lederer from [facility] medicine.  A family meeting to discuss the patient's current condition was held, next steps and broad goals of care discussions were also undertaken.  We reviewed about the patient's current condition, his underlying illnesses and the severity of his COVID-19 infection.  We discussed about patient's state of inflammation, current lung imaging and current blood work results.  We discussed about marked symptom burden of shortness of breath and possibly generalized distress and suffering.  We discussed about patient's lack of interest in eating or drinking.  Discussed about patient's ongoing lethargic state and him not mentating properly for various reasons.  Goals wishes and values important to the patient and family as a unit attempted to be explored.  Specifically, we discussed about full scope of comfort measures, we discussed about judicious use of opioids for pain and on pain symptoms, we discussed about the patient having markedly limited prognosis of few hours to a few days.  Patient's family was given the chance to ask questions with regards to the patient's current condition and recommendations.  Patient's family reflected on the patient's previously completed advanced directives.  Plan: Comfort measures only. Would recommend continuation of BiPAP until patient's 1 other son is able to come visit.  After that, would leave it at the discretion of bedside nursing staff and respiratory therapist to adjust supplemental oxygen. Start IV Dilaudid as needed.  Patient's by mouth medications which are no longer contributing to comfort will be  discontinued.  Serial blood glucose monitoring will be discontinued.  Patient may likely need a continuous Dilaudid infusion based on his IV Dilaudid as needed use. Anticipated hospital death, prognosis of few hours to a few days discussed frankly and compassionately with family at the goals of care meeting.  Additional 35 minutes spent. Time in 1400 Time out [place] [facility] [place] palliative.   Addendum: 16-Sep-2020 1620 Family has visited/spent time with the patient.  Ok to D/C BIPAP Dilaudid infusion continuous + bolus PRN.  Anticipated hospital death.  Appreciate bedside RN assistance.  Loistine Chance MD

## 2020-10-04 NOTE — Plan of Care (Signed)
  Problem: Education: Goal: Knowledge of General Education information will improve Description: Including pain rating scale, medication(s)/side effects and non-pharmacologic comfort measures 09/09/2020 1650 by Micki Riley, RN Outcome: Not Progressing 09/06/2020 1649 by Micki Riley, RN Outcome: Not Progressing   Problem: Health Behavior/Discharge Planning: Goal: Ability to manage health-related needs will improve 09/20/2020 1650 by Micki Riley, RN Outcome: Not Progressing 09/10/2020 1649 by Micki Riley, RN Outcome: Not Progressing   Problem: Clinical Measurements: Goal: Ability to maintain clinical measurements within normal limits will improve 09/08/2020 1650 by Micki Riley, RN Outcome: Not Progressing 09/30/2020 1649 by Micki Riley, RN Outcome: Not Progressing Goal: Will remain free from infection 09/08/2020 1650 by Micki Riley, RN Outcome: Not Progressing 09/03/2020 1649 by Micki Riley, RN Outcome: Not Progressing Goal: Diagnostic test results will improve 09/19/2020 1650 by Micki Riley, RN Outcome: Not Progressing 09/03/2020 1649 by Micki Riley, RN Outcome: Not Progressing Goal: Respiratory complications will improve 10/03/2020 1650 by Micki Riley, RN Outcome: Not Progressing 09/26/2020 1649 by Micki Riley, RN Outcome: Not Progressing Goal: Cardiovascular complication will be avoided 09/12/2020 1650 by Micki Riley, RN Outcome: Not Progressing 09/17/2020 1649 by Micki Riley, RN Outcome: Not Progressing   Problem: Activity: Goal: Risk for activity intolerance will decrease 09/10/2020 1650 by Micki Riley, RN Outcome: Not Progressing 09/22/2020 1649 by Micki Riley, RN Outcome: Not Progressing   Problem: Nutrition: Goal: Adequate nutrition will be  maintained 09/12/2020 1650 by Micki Riley, RN Outcome: Not Progressing 09/21/2020 1649 by Micki Riley, RN Outcome: Not Progressing   Problem: Coping: Goal: Level of anxiety will decrease 10/02/2020 1650 by Micki Riley, RN Outcome: Not Progressing 10/02/2020 1649 by Micki Riley, RN Outcome: Not Progressing   Problem: Elimination: Goal: Will not experience complications related to bowel motility 09/28/2020 1650 by Micki Riley, RN Outcome: Not Progressing 09/22/2020 1649 by Micki Riley, RN Outcome: Not Progressing Goal: Will not experience complications related to urinary retention 09/12/2020 1650 by Micki Riley, RN Outcome: Not Progressing 09/20/2020 1649 by Micki Riley, RN Outcome: Not Progressing   Problem: Pain Managment: Goal: General experience of comfort will improve 09/30/2020 1650 by Micki Riley, RN Outcome: Not Progressing 09/11/2020 1649 by Micki Riley, RN Outcome: Not Progressing   Problem: Safety: Goal: Ability to remain free from injury will improve 09/21/2020 1650 by Micki Riley, RN Outcome: Not Progressing 09/20/2020 1649 by Micki Riley, RN Outcome: Not Progressing   Problem: Skin Integrity: Goal: Risk for impaired skin integrity will decrease 09/19/2020 1650 by Micki Riley, RN Outcome: Not Progressing 09/07/2020 1649 by Micki Riley, RN Outcome: Not Progressing   Problem: Safety: Goal: Non-violent Restraint(s) 09/21/2020 1650 by Micki Riley, RN Outcome: Not Progressing 09/15/2020 1649 by Micki Riley, RN Outcome: Not Progressing   Problem: Education: Goal: Knowledge of risk factors and measures for prevention of condition will improve Outcome: Not Progressing   Problem: Coping: Goal: Psychosocial and spiritual needs will be  supported Outcome: Not Progressing   Problem: Respiratory: Goal: Will maintain a patent airway Outcome: Not Progressing Goal: Complications related to the disease process, condition or treatment will be avoided or minimized Outcome: Not Progressing

## 2020-10-04 NOTE — Care Plan (Signed)
Pt picked up from unit for transport to morgue.

## 2020-10-04 NOTE — Progress Notes (Signed)
PROGRESS NOTE  Daniel Mays DGU:[id number] DOB: [birth year] DOA: 2020/09/25 PCP: Jerl Mina, MD   LOS: 5 days   Brief Narrative / Interim history: 80 year old history of dementia, CAD with stent, history of CABG, COPD, renal artery stenosis, DM, HTN, HLD admitted to the hospital with COVID-19. He was initially diagnosed on 12/3, had worsening symptoms and eventually came to the hospital and was admitted on 12/6 with hypoxic respiratory failure in the setting of COVID-19 pneumonia.  Subjective / 24h Interval events: Worsening today.  100% FiO2, 60 L.  Responds some but mostly has become more lethargic  Assessment & Plan:  Principal Problem Acute Hypoxic Respiratory Failure due to Covid-19 Viral Illness -Initially admitted to the ICU service, transferred to the hospitalist.  Despite treatment with steroids, baricitinib, his hypoxia has progressively gotten worse and currently he is on heated high flow.  He is tachypneic and seems to be struggling to breathe.  -Started on steroids, baricitinib on 12/6. Continue. -Not started on remdesivir due to questionable benefit -Increased WBC on 12/10, chest x-ray looked worse and he was started on antibiotics.  Also received Lasix  COVID-19 Labs  Recent Labs    09/11/20 0257 09/12/20 0250 09/21/2020 0221  DDIMER 2.65* 2.72* 3.13*  FERRITIN 187 359* 531*  CRP 8.7* 15.9* 19.8*    Lab Results  Component Value Date   SARSCOV2NAA POSITIVE (A) September 25, 2020   Active Problems OSA, history of COPD -Continue BiPAP nightly, continue steroids, no wheezing  Acute kidney injury on chronic kidney disease stage IIIb -With underlying renal artery stenosis.  Received Lasix yesterday, creatinine jumped to 1.8 suggesting very tenuous renal function  Malignant hypertension -Remains persistently hypertensive, increase clonidine patch today  CAD with history of stents, CABG, HTN, HLD -Continue aspirin, Plavix, statin, metoprolol.  No apparent chest  pain  Dementia with ICU induced delirium -Continue home Aricept  DM2 -Continue sliding scale, linagliptin, CBGs within acceptable range  CBG (last 3)  Recent Labs    09/12/20 0355 09/12/20 0811 09/12/20 1105  GLUCAP 149* 159* 189*   Goals of care -Family meeting today at 2 PM  Scheduled Meds: . amLODipine  10 mg Oral Daily  . vitamin C  500 mg Oral Daily  . aspirin EC  81 mg Oral Daily  . baricitinib  2 mg Oral Daily  . chlorhexidine  15 mL Mouth Rinse BID  . Chlorhexidine Gluconate Cloth  6 each Topical Daily  . cloNIDine  0.3 mg Transdermal Weekly  . clopidogrel  75 mg Oral Daily  . docusate sodium  100 mg Oral BID  . donepezil  10 mg Oral QHS  . folic acid  1 mg Oral Daily  . heparin  5,000 Units Subcutaneous Q8H  . insulin aspart  0-9 Units Subcutaneous Q4H  . linagliptin  5 mg Oral Daily  . mouth rinse  15 mL Mouth Rinse BID  . mouth rinse  15 mL Mouth Rinse q12n4p  . metoprolol tartrate  50 mg Oral BID  . multivitamin with minerals  1 tablet Oral Daily  . pantoprazole  40 mg Oral Daily  . predniSONE  50 mg Oral Daily  . rosuvastatin  20 mg Oral Daily  . sodium chloride flush  10 mL Intravenous Q12H  . thiamine  100 mg Oral Daily  . zinc sulfate  220 mg Oral Daily   Continuous Infusions: . sodium chloride 250 mL (09/15/2020 0248)  . piperacillin-tazobactam (ZOSYN)  IV 3.375 g (09/10/2020 1042)   PRN Meds:.sodium chloride,  acetaminophen, chlorpheniramine-HYDROcodone, guaiFENesin-dextromethorphan, hydrALAZINE, ondansetron **OR** ondansetron (ZOFRAN) IV, Resource ThickenUp Clear  DVT prophylaxis: heparin Code Status: Full code Family Communication: Family meeting at 2 PM  Status is: Inpatient  Remains inpatient appropriate because:Inpatient level of care appropriate due to severity of illness   Dispo: The patient is from: Home              Anticipated d/c is to: Home              Anticipated d/c date is: > 3 days anticipate in hospital death               Patient currently is not medically stable to d/c.   Consultants:  PCCM Palliative  Procedures:  None   Microbiology: None   Antibacterials: None    Objective: Vitals:   09/24/2020 1022 09/24/20 1040 09-24-2020 1200 09-24-20 1255  BP: (!) 174/68   (!) 183/76  Pulse:      Resp:      Temp:   (!) 100.5 F (38.1 C)   TempSrc:   Axillary   SpO2:  (!) 85%    Weight:      Height:        Intake/Output Summary (Last 24 hours) at 09-24-2020 1323 Last data filed at 09/24/2020 0600 Gross per 24 hour  Intake 99.8 ml  Output 750 ml  Net -650.2 ml   Filed Weights   09/10/20 0500 09/12/20 0412 September 24, 2020 0500  Weight: 100.6 kg 97.5 kg 93.6 kg    Examination:  Constitutional: Confused, tachypneic, appears uncomfortable Eyes: No scleral icterus ENMT: Moist external drains Neck: normal, supple Respiratory: Tachypnea, shallow respirations, diffuse rhonchi at the bases Cardiovascular: Regular rate and rhythm, no murmurs, trace edema Abdomen: Soft, nontender, bowel sounds positive Musculoskeletal: no clubbing / cyanosis.  Skin: No rashes Neurologic: Lethargic, does not follow commands  Data Reviewed: I have independently reviewed following labs and imaging studies   CBC: Recent Labs  Lab 09/09/20 0248 09/10/20 0252 09/11/20 0257 09/12/20 0250 09-24-20 0221  WBC 9.9 18.3* 18.7* 24.7* 21.7*  NEUTROABS 8.7* 15.6* 16.2* 20.6* 17.9*  HGB 13.5 13.8 13.3 14.3 14.3  HCT 43.7 43.3 43.0 45.0 45.4  MCV 97.3 94.3 95.3 94.5 95.8  PLT 275 321 400 412* 420*   Basic Metabolic Panel: Recent Labs  Lab 09/09/20 0248 09/10/20 0252 09/11/20 0257 09/12/20 0250 24-Sep-2020 0221  NA 140 140 141 144 147*  K 5.0 4.4 4.2 4.4 4.2  CL 104 103 105 106 105  CO2 23 26 28 26 29   GLUCOSE 168* 208* 172* 174* 161*  BUN 42* 53* 37* 42* 51*  CREATININE 1.67* 1.61* 1.27* 1.25* 1.85*  CALCIUM 8.7* 9.0 8.7* 9.1 8.9  MG 2.8* 2.7* 2.7* 3.0* 3.1*  PHOS 3.9 2.6 3.2 2.9 4.3   GFR: Estimated Creatinine  Clearance: 33.5 mL/min (A) (by C-G formula based on SCr of 1.85 mg/dL (H)). Liver Function Tests: Recent Labs  Lab 09/09/20 0248 09/10/20 0252 09/11/20 0257 09/12/20 0250 09-24-2020 0221  AST 31 30 27 26 23   ALT 22 24 20 19 16   ALKPHOS 38 39 39 49 48  BILITOT 0.8 0.4 0.5 0.6 0.4  PROT 7.7 7.4 7.0 7.5 7.3  ALBUMIN 3.6 3.3* 3.2* 3.3* 3.0*   No results for input(s): LIPASE, AMYLASE in the last 168 hours. No results for input(s): AMMONIA in the last 168 hours. Coagulation Profile: No results for input(s): INR, PROTIME in the last 168 hours. Cardiac Enzymes: No results for input(s): CKTOTAL,  CKMB, CKMBINDEX, TROPONINI in the last 168 hours. BNP (last 3 results) No results for input(s): PROBNP in the last 8760 hours. HbA1C: No results for input(s): HGBA1C in the last 72 hours. CBG: Recent Labs  Lab 09/11/20 1956 09/11/20 2320 09/12/20 0355 09/12/20 0811 09/12/20 1105  GLUCAP 167* 179* 149* 159* 189*   Lipid Profile: No results for input(s): CHOL, HDL, LDLCALC, TRIG, CHOLHDL, LDLDIRECT in the last 72 hours. Thyroid Function Tests: No results for input(s): TSH, T4TOTAL, FREET4, T3FREE, THYROIDAB in the last 72 hours. Anemia Panel: Recent Labs    09/12/20 0250 09/21/2020 0221  FERRITIN 359* 531*   Urine analysis: No results found for: COLORURINE, APPEARANCEUR, LABSPEC, PHURINE, GLUCOSEU, HGBUR, BILIRUBINUR, KETONESUR, PROTEINUR, UROBILINOGEN, NITRITE, LEUKOCYTESUR Sepsis Labs: Invalid input(s): PROCALCITONIN, LACTICIDVEN  Recent Results (from the past 240 hour(s))  Resp Panel by RT-PCR (Flu A&B, Covid) Nasopharyngeal Swab     Status: Abnormal   Collection Time: 10/01/2020 11:50 AM   Specimen: Nasopharyngeal Swab; Nasopharyngeal(NP) swabs in vial transport medium  Result Value Ref Range Status   SARS Coronavirus 2 by RT PCR POSITIVE (A) NEGATIVE Final    Comment: RESULT CALLED TO, READ BACK BY AND VERIFIED WITH: JACOBS,D. RN @1332  ON 09/20/2020 BY NMCCOY (NOTE) SARS-CoV-2  target nucleic acids are DETECTED.  The SARS-CoV-2 RNA is generally detectable in upper respiratory specimens during the acute phase of infection. Positive results are indicative of the presence of the identified virus, but do not rule out bacterial infection or co-infection with other pathogens not detected by the test. Clinical correlation with patient history and other diagnostic information is necessary to determine patient infection status. The expected result is Negative.  Fact Sheet for Patients: BloggerCourse.com  Fact Sheet for Healthcare Providers: SeriousBroker.it  This test is not yet approved or cleared by the [place] FDA and  has been authorized for detection and/or diagnosis of SARS-CoV-2 by FDA under an Emergency Use Authorization (EUA).  This EUA will remain in effect (meaning this tes t can be used) for the duration of  the COVID-19 declaration under Section 564(b)(1) of the Act, 21 U.S.C. section 360bbb-3(b)(1), unless the authorization is terminated or revoked sooner.     Influenza A by PCR NEGATIVE NEGATIVE Final   Influenza B by PCR NEGATIVE NEGATIVE Final    Comment: (NOTE) The Xpert Xpress SARS-CoV-2/FLU/RSV plus assay is intended as an aid in the diagnosis of influenza from Nasopharyngeal swab specimens and should not be used as a sole basis for treatment. Nasal washings and aspirates are unacceptable for Xpert Xpress SARS-CoV-2/FLU/RSV testing.  Fact Sheet for Patients: BloggerCourse.com  Fact Sheet for Healthcare Providers: SeriousBroker.it  This test is not yet approved or cleared by the [place] FDA and has been authorized for detection and/or diagnosis of SARS-CoV-2 by FDA under an Emergency Use Authorization (EUA). This EUA will remain in effect (meaning this test can be used) for the duration of the COVID-19 declaration under Section  564(b)(1) of the Act, 21 U.S.C. section 360bbb-3(b)(1), unless the authorization is terminated or revoked.  Performed at [facility], [address] [place], [place] [address]   Blood Culture (routine x 2)     Status: None   Collection Time: September 13, 2020 11:51 AM   Specimen: BLOOD  Result Value Ref Range Status   Specimen Description   Final    BLOOD RIGHT ANTECUBITAL Performed at [facility], [address], [place], [place] [address]    Special Requests   Final  BOTTLES DRAWN AEROBIC AND ANAEROBIC Blood Culture adequate volume Performed at [facility], [address] [place], [place] [address]    Culture   Final    NO GROWTH 5 DAYS Performed at [facility], [address], [place], [place] [address]    Report Status 09-16-2020 FINAL  Final  Blood Culture (routine x 2)     Status: None   Collection Time: 09/29/2020 11:51 AM   Specimen: BLOOD  Result Value Ref Range Status   Specimen Description   Final    BLOOD RIGHT WRIST Performed at [facility], [address] [place], [place] [address]    Special Requests   Final    BOTTLES DRAWN AEROBIC AND ANAEROBIC Blood Culture adequate volume Performed at [facility], [address], [place], [place] [address]    Culture   Final    NO GROWTH 5 DAYS Performed at [facility], [address], [place], [place] [address]    Report Status 2020-09-16 FINAL  Final  MRSA PCR Screening     Status: None   Collection Time: 09/11/2020  5:13 PM   Specimen: Nasopharyngeal  Result Value Ref Range Status   MRSA by PCR NEGATIVE NEGATIVE Final    Comment:        The GeneXpert MRSA Assay (FDA approved for NASAL specimens only), is one component of a comprehensive MRSA colonization surveillance program. It is not intended to diagnose MRSA infection nor to guide or monitor treatment for MRSA infections. Performed at [facility], [address] [place], [place] [address]       Radiology Studies: DG CHEST PORT 1 VIEW  Result Date: 09/12/2020 CLINICAL DATA:  Shortness of breath.  Hypoxemia. EXAM: PORTABLE CHEST 1 VIEW COMPARISON:  09/09/2020 FINDINGS: Slightly decreased lung volumes. Increased opacities in the right lung particularly at the right lung base. There may be increased airspace densities in the left upper lung region. Evidence of previous CABG procedure. Negative for a pneumothorax. IMPRESSION: Decreased lung volumes with increased airspace densities in the right lung, particularly at the right lung base. Findings are concerning for pneumonia. Questionable increased densities in the left upper lung region. Electronically Signed   By: Richarda Overlie M.D.   On: 09/12/2020 09:30   Pamella Pert, MD, PhD [facility]  Between 7 am - 7 pm I am available, please contact me via Amion or Securechat  Between 7 pm - 7 am I am not available, please contact night coverage MD/APP via Amion

## 2020-10-04 NOTE — Progress Notes (Signed)
Pt stats dropped to the 60s. I had staff call respiratory team. I went in & raised the [facility]. I changed HHFNC to 80L. Pt stats were still in the 70s. Respiratory team came, while I grabbed a new pulse oximetry to check if stats were correct and moved it from his R ear to R finger. Respiratory has placed pt on Bi-Pap. Pt stats have went back up to the 80s. This nurse will continue to monitor.

## 2020-10-04 NOTE — Discharge Summary (Signed)
Death Summary  Daniel Mays CVE:[id number] DOB: 1941-04-28 DOA: September 26, 2020  PCP: Jerl Mina, MD  Admit date: September 26, 2020 Date of Death: 2020-10-01 Time of Death: 18:28 Notification: Jerl Mina, MD notified of death of 2020-10-02   History of present illness:  Per admitting MD, 80 y/o M who presented to the [facility] on September 27, 2023 for planned outpatient monoclonal antibody treatment for known COVID infection.  Per his son he is not vaccinated against Covid. He was diagnosed with COVID on 12/3 and became progressively more short of breath. On arrival to the infusion clinic, he was found to have room air saturations of 75%. He was treated with 6L O2 and transferred to [facility] ER for evaluation.  CXR demonstrated low lung volumes, diffuse bilateral infiltrates consistent with COVID PNA. Initial ABG 7.295 / 54.7 / 82.1 / 25.8.  Labs notable for Na 139, K 4.4, Cl 102, CO2 25, glucose 143, BUN 34 / Sr Cr 1.71, BNP 97, LDH 334, HS troponin 30, WBC 12, Hgb 14, Platelets 268, D-Dimer 3.70, fibrinogen >800. O2 needs progressed to 12L HFNC with sats of 90-94%.  He was confused on presentation, oriented to self and place.   Hospital course  Principal Problem Acute Hypoxic Respiratory Failure due to Covid-19 Viral Illness -Initially admitted to the ICU service, transferred to the hospitalist.  Despite treatment with steroids, baricitinib, antibiotics his clinical condition worsened, did not respond to treatment and after family meeting his care was transitioned to comfort. Patient passed away on 10/01/20.  Final Diagnoses:  Acute hypoxic respiratory failure due to Covid 19 pneumonia Bacterial pneumonia ARDS OSA COPD Acute kidney injury on chronic kidney disease stage IIIb Malignant hypertension Renal artery stenos CAD with history of stents, CABG, HTN, HLD Dementia with ICU induced delirium DM2 with steroid induced hyperglycemia Leukocytosis Thrombocytosis    The results of  significant diagnostics from this hospitalization (including imaging, microbiology, ancillary and laboratory) are listed below for reference.    Significant Diagnostic Studies: DG CHEST PORT 1 VIEW  Result Date: 09/12/2020 CLINICAL DATA:  Shortness of breath.  Hypoxemia. EXAM: PORTABLE CHEST 1 VIEW COMPARISON:  09/09/2020 FINDINGS: Slightly decreased lung volumes. Increased opacities in the right lung particularly at the right lung base. There may be increased airspace densities in the left upper lung region. Evidence of previous CABG procedure. Negative for a pneumothorax. IMPRESSION: Decreased lung volumes with increased airspace densities in the right lung, particularly at the right lung base. Findings are concerning for pneumonia. Questionable increased densities in the left upper lung region. Electronically Signed   By: Richarda Overlie M.D.   On: 09/12/2020 09:30   Portable chest 1 View  Result Date: 09/09/2020 CLINICAL DATA:  Shortness of breath. EXAM: PORTABLE CHEST 1 VIEW COMPARISON:  Sep 26, 2020. FINDINGS: Prior CABG. Heart size stable. No pulmonary venous congestion. Bilateral multifocal atelectasis/infiltrates again noted. Slight improvement on aeration from prior exam. No pleural effusion or pneumothorax. No acute bony abnormality identified. IMPRESSION: 1. Bilateral multifocal atelectatic changes and infiltrates again noted with slight improvement in aeration from prior exam. 2.  Prior CABG.  Heart size stable.  No pulmonary venous congestion. Electronically Signed   By: Maisie Fus  Register   On: 09/09/2020 06:08   DG Chest Port 1 View  Result Date: 2020-09-26 CLINICAL DATA:  Coronavirus infection. EXAM: PORTABLE CHEST 1 VIEW COMPARISON:  07/28/2014 FINDINGS: Previous median sternotomy and CABG. Widespread hazy and patchy pulmonary infiltrates consistent with viral pneumonia. No dense consolidation or lobar collapse. No visible effusion. No  acute bone finding. IMPRESSION: Widespread hazy and patchy  pulmonary infiltrates consistent with viral pneumonia. No dense consolidation or lobar collapse. Electronically Signed   By: Paulina Fusi M.D.   On: 10/02/2020 11:57    Microbiology: Recent Results (from the past 240 hour(s))  Resp Panel by RT-PCR (Flu A&B, Covid) Nasopharyngeal Swab     Status: Abnormal   Collection Time: 09/20/2020 11:50 AM   Specimen: Nasopharyngeal Swab; Nasopharyngeal(NP) swabs in vial transport medium  Result Value Ref Range Status   SARS Coronavirus 2 by RT PCR POSITIVE (A) NEGATIVE Final    Comment: RESULT CALLED TO, READ BACK BY AND VERIFIED WITH: JACOBS,D. RN @1332  ON 12.6.2021 BY NMCCOY (NOTE) SARS-CoV-2 target nucleic acids are DETECTED.  The SARS-CoV-2 RNA is generally detectable in upper respiratory specimens during the acute phase of infection. Positive results are indicative of the presence of the identified virus, but do not rule out bacterial infection or co-infection with other pathogens not detected by the test. Clinical correlation with patient history and other diagnostic information is necessary to determine patient infection status. The expected result is Negative.  Fact Sheet for Patients: 14.6.2021  Fact Sheet for Healthcare Providers: BloggerCourse.com  This test is not yet approved or cleared by the [place] FDA and  has been authorized for detection and/or diagnosis of SARS-CoV-2 by FDA under an Emergency Use Authorization (EUA).  This EUA will remain in effect (meaning this tes t can be used) for the duration of  the COVID-19 declaration under Section 564(b)(1) of the Act, 21 U.S.C. section 360bbb-3(b)(1), unless the authorization is terminated or revoked sooner.     Influenza A by PCR NEGATIVE NEGATIVE Final   Influenza B by PCR NEGATIVE NEGATIVE Final    Comment: (NOTE) The Xpert Xpress SARS-CoV-2/FLU/RSV plus assay is intended as an aid in the diagnosis of influenza  from Nasopharyngeal swab specimens and should not be used as a sole basis for treatment. Nasal washings and aspirates are unacceptable for Xpert Xpress SARS-CoV-2/FLU/RSV testing.  Fact Sheet for Patients: [place]  Fact Sheet for Healthcare Providers: BloggerCourse.com  This test is not yet approved or cleared by the SeriousBroker.[place] FDA and has been authorized for detection and/or diagnosis of SARS-CoV-2 by FDA under an Emergency Use Authorization (EUA). This EUA will remain in effect (meaning this test can be used) for the duration of the COVID-19 declaration under Section 564(b)(1) of the Act, 21 U.S.C. section 360bbb-3(b)(1), unless the authorization is terminated or revoked.  Performed at [facility], [address], [place], [place]   Blood Culture (routine x 2)     Status: None   Collection Time: 09/14/2020 11:51 AM   Specimen: BLOOD  Result Value Ref Range Status   Specimen Description   Final    BLOOD RIGHT ANTECUBITAL Performed at [facility], [address], [place], [place]    Special Requests   Final    BOTTLES DRAWN AEROBIC AND ANAEROBIC Blood Culture adequate volume Performed at [facility], [address], [place], [place]    Culture   Final    NO GROWTH 5 DAYS Performed at [facility], [address] [place], [place]    Report Status 09-29-2020 FINAL  Final  Blood Culture (routine x 2)     Status: None   Collection Time: 09/04/2020 11:51 AM   Specimen: BLOOD  Result Value Ref Range Status   Specimen Description  Final    BLOOD RIGHT WRIST Performed at [facility], [address] [place], [place] [address]    Special Requests   Final    BOTTLES DRAWN AEROBIC AND ANAEROBIC Blood Culture adequate volume Performed at [facility], [address],  [place], [place] [address]    Culture   Final    NO GROWTH 5 DAYS Performed at [facility], [address], [place], [place] [address]    Report Status 09/17/2020 FINAL  Final  MRSA PCR Screening     Status: None   Collection Time: 09/24/2020  5:13 PM   Specimen: Nasopharyngeal  Result Value Ref Range Status   MRSA by PCR NEGATIVE NEGATIVE Final    Comment:        The GeneXpert MRSA Assay (FDA approved for NASAL specimens only), is one component of a comprehensive MRSA colonization surveillance program. It is not intended to diagnose MRSA infection nor to guide or monitor treatment for MRSA infections. Performed at [facility], [address], [place], [place] [address]      Labs: Basic Metabolic Panel: Recent Labs  Lab 09/09/20 0248 09/10/20 0252 09/11/20 0257 09/12/20 0250 09/17/20 0221  NA 140 140 141 144 147*  K 5.0 4.4 4.2 4.4 4.2  CL 104 103 105 106 105  CO2 23 26 28 26 29   GLUCOSE 168* 208* 172* 174* 161*  BUN 42* 53* 37* 42* 51*  CREATININE 1.67* 1.61* 1.27* 1.25* 1.85*  CALCIUM 8.7* 9.0 8.7* 9.1 8.9  MG 2.8* 2.7* 2.7* 3.0* 3.1*  PHOS 3.9 2.6 3.2 2.9 4.3   Liver Function Tests: Recent Labs  Lab 09/09/20 0248 09/10/20 0252 09/11/20 0257 09/12/20 0250 17-Sep-2020 0221  AST 31 30 27 26 23   ALT 22 24 20 19 16   ALKPHOS 38 39 39 49 48  BILITOT 0.8 0.4 0.5 0.6 0.4  PROT 7.7 7.4 7.0 7.5 7.3  ALBUMIN 3.6 3.3* 3.2* 3.3* 3.0*   No results for input(s): LIPASE, AMYLASE in the last 168 hours. No results for input(s): AMMONIA in the last 168 hours. CBC: Recent Labs  Lab 09/09/20 0248 09/10/20 0252 09/11/20 0257 09/12/20 0250 09-17-2020 0221  WBC 9.9 18.3* 18.7* 24.7* 21.7*  NEUTROABS 8.7* 15.6* 16.2* 20.6* 17.9*  HGB 13.5 13.8 13.3 14.3 14.3  HCT 43.7 43.3 43.0 45.0 45.4  MCV 97.3 94.3 95.3 94.5 95.8  PLT 275 321 400 412* 420*   Cardiac Enzymes: No results for input(s): CKTOTAL, CKMB, CKMBINDEX, TROPONINI in the last 168  hours. D-Dimer Recent Labs    09/12/20 0250 September 17, 2020 0221  DDIMER 2.72* 3.13*   BNP: Invalid input(s): POCBNP CBG: Recent Labs  Lab 09/11/20 1956 09/11/20 2320 09/12/20 0355 09/12/20 0811 09/12/20 1105  GLUCAP 167* 179* 149* 159* 189*   Anemia work up Recent Labs    09/12/20 0250 September 17, 2020 0221  FERRITIN 359* 531*   Urinalysis No results found for: COLORURINE, APPEARANCEUR, LABSPEC, PHURINE, GLUCOSEU, HGBUR, BILIRUBINUR, KETONESUR, PROTEINUR, UROBILINOGEN, NITRITE, LEUKOCYTESUR Sepsis Labs Invalid input(s): PROCALCITONIN,  WBC,  LACTICIDVEN     SIGNED:  14/10/21, MD  [facility] 09/14/2020, 6:01 AM Pager   If 7PM-7AM, please contact night-coverage [email or web address] Password [email or web address]

## 2020-10-04 DEATH — deceased
